# Patient Record
Sex: Female | Born: 1980
Health system: Southern US, Community
[De-identification: ages and names within clinical notes are randomized; demographics above are authoritative.]

## PROBLEM LIST (undated history)

## (undated) DIAGNOSIS — M5126 Other intervertebral disc displacement, lumbar region: Secondary | ICD-10-CM

## (undated) DIAGNOSIS — Z8711 Personal history of peptic ulcer disease: Secondary | ICD-10-CM

## (undated) DIAGNOSIS — F419 Anxiety disorder, unspecified: Secondary | ICD-10-CM

## (undated) DIAGNOSIS — G43909 Migraine, unspecified, not intractable, without status migrainosus: Secondary | ICD-10-CM

## (undated) DIAGNOSIS — J45909 Unspecified asthma, uncomplicated: Secondary | ICD-10-CM

## (undated) DIAGNOSIS — R569 Unspecified convulsions: Secondary | ICD-10-CM

## (undated) DIAGNOSIS — F32A Depression, unspecified: Secondary | ICD-10-CM

## (undated) DIAGNOSIS — F329 Major depressive disorder, single episode, unspecified: Secondary | ICD-10-CM

## (undated) DIAGNOSIS — N6002 Solitary cyst of left breast: Secondary | ICD-10-CM

## (undated) DIAGNOSIS — M797 Fibromyalgia: Secondary | ICD-10-CM

## (undated) DIAGNOSIS — Z8719 Personal history of other diseases of the digestive system: Secondary | ICD-10-CM

## (undated) HISTORY — DX: Solitary cyst of left breast: N60.02

## (undated) HISTORY — DX: Major depressive disorder, single episode, unspecified: F32.9

## (undated) HISTORY — DX: Other intervertebral disc displacement, lumbar region: M51.26

## (undated) HISTORY — DX: Unspecified convulsions: R56.9

## (undated) HISTORY — DX: Migraine, unspecified, not intractable, without status migrainosus: G43.909

## (undated) HISTORY — DX: Anxiety disorder, unspecified: F41.9

## (undated) HISTORY — DX: Fibromyalgia: M79.7

## (undated) HISTORY — DX: Unspecified asthma, uncomplicated: J45.909

## (undated) HISTORY — DX: Personal history of peptic ulcer disease: Z87.11

## (undated) HISTORY — DX: Personal history of other diseases of the digestive system: Z87.19

## (undated) HISTORY — DX: Depression, unspecified: F32.A

---
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
## 2001-01-30 HISTORY — PX: CHOLECYSTECTOMY: SHX55

## 2001-01-30 HISTORY — PX: TUBAL LIGATION: SHX77

## 2003-01-31 HISTORY — PX: VAGINAL HYSTERECTOMY: SUR661

## 2004-01-31 HISTORY — PX: LAPAROSCOPIC OOPHERECTOMY: SHX6507

## 2004-01-31 HISTORY — PX: OTHER SURGICAL HISTORY: SHX169

## 2005-01-30 HISTORY — PX: LAPAROSCOPIC OOPHERECTOMY: SHX6507

## 2015-07-21 ENCOUNTER — Ambulatory Visit (INDEPENDENT_AMBULATORY_CARE_PROVIDER_SITE_OTHER): Payer: BLUE CROSS/BLUE SHIELD | Admitting: Neurology

## 2015-07-21 ENCOUNTER — Encounter: Payer: Self-pay | Admitting: Neurology

## 2015-07-21 VITALS — BP 105/79 | HR 71 | Ht 64.0 in | Wt 210.8 lb

## 2015-07-21 DIAGNOSIS — G43009 Migraine without aura, not intractable, without status migrainosus: Secondary | ICD-10-CM | POA: Diagnosis not present

## 2015-07-21 DIAGNOSIS — Z79899 Other long term (current) drug therapy: Secondary | ICD-10-CM

## 2015-07-21 MED ORDER — DIVALPROEX SODIUM ER 500 MG PO TB24: 1000.0000 mg | ORAL_TABLET | Freq: Every day | ORAL | Status: DC

## 2015-07-21 MED ORDER — KETOROLAC TROMETHAMINE 60 MG/2ML IM SOLN
60.0000 mg | Freq: Once | INTRAMUSCULAR | Status: AC
  Administered 2015-07-21: 60 mg via INTRAMUSCULAR

## 2015-07-21 MED ORDER — RIZATRIPTAN BENZOATE 10 MG PO TABS: 10.0000 mg | ORAL_TABLET | ORAL | Status: DC | PRN

## 2015-07-21 MED ORDER — ONDANSETRON 4 MG PO TBDP: ORAL_TABLET | ORAL | Status: DC

## 2015-07-21 NOTE — Patient Instructions (Addendum)
Remember to drink plenty of fluid, eat healthy meals and do not skip any meals. Try to eat protein with a every meal and eat a healthy snack such as fruit or nuts in between meals. Try to keep a regular sleep-wake schedule and try to exercise daily, particularly in the form of walking, 20-30 minutes a day, if you can.   As far as your medications are concerned, I would like to suggest: Depakote ER 1000mg  at night At onset of headache zofran and maxalt may repeat in 2 hours Try Cambia as well for headache especially if you don;t catch it in time (may use with maxalt and zofran)  As far as diagnostic testing: Labs   Our phone number is [phone]. We also have an after hours call service for urgent matters and there is a physician on-call for urgent questions. For any emergencies you know to call 911 or go to the nearest emergency room  Valproic Acid, Divalproex Sodium delayed or extended-release tablets What is this medicine? DIVALPROEX SODIUM (dye VAL pro ex SO dee um) is used to prevent seizures caused by some forms of epilepsy. It is also used to treat bipolar mania and to prevent migraine headaches. This medicine may be used for other purposes; ask your health care provider or pharmacist if you have questions. What should I tell my health care provider before I take this medicine? They need to know if you have any of these conditions: -blood disease -brain damage or disease -kidney disease -liver disease -low blood proteins -mitochondrial disease -suicidal thoughts, plans, or attempt; a previous suicide attempt by you or a family member -urea cycle disorder (UCD) -an unusual or allergic reaction to divalproex sodium, other medicines, foods, dyes, or preservatives -pregnant or trying to get pregnant -breast-feeding How should I use this medicine? Take this medicine by mouth with a drink of water. Follow the directions on the prescription label. Do not crush or chew. If this medicine  upsets your stomach, take it with food or milk. Take your medicine at regular intervals. Do not take it more often than directed. Talk to your pediatrician regarding the use of this medicine in children. Special care may be needed. Overdosage: If you think you have taken too much of this medicine contact a poison control center or emergency room at once. NOTE: This medicine is only for you. Do not share this medicine with others. What if I miss a dose? If you miss a dose, take it as soon as you can. If it is almost time for your next dose, take only that dose. Do not take double or extra doses. What may interact with this medicine? -aspirin -barbiturates, like phenobarbital -diazepam -isoniazid -medicines for depression, anxiety, or psychotic disturbances -medicines that treat or prevent blood clots like warfarin -meropenem -other seizure medicines -rifampin -tolbutamide -zidovudine This list may not describe all possible interactions. Give your health care provider a list of all the medicines, herbs, non-prescription drugs, or dietary supplements you use. Also tell them if you smoke, drink alcohol, or use illegal drugs. Some items may interact with your medicine. What should I watch for while using this medicine? Visit your doctor or health care professional for regular checks on your progress. If you are taking this medicine to treat epilepsy (seizures), do not stop taking it suddenly. This increases the risk of seizures. Wear a medical identification bracelet or chain to say you have epilepsy or seizures, and carry a card that lists all your medicines. You  may get drowsy, dizzy, or have blurred vision. Do not drive, use machinery, or do anything that needs mental alertness until you know how this medicine affects you. To reduce dizzy or fainting spells, do not sit or stand up quickly, especially if you are an older patient. Alcohol can increase drowsiness and dizziness. Avoid alcoholic  drinks. This medicine can cause blood problems. This can mean slow healing and a risk of infection. Problems can arise if you need dental work, and in the day to day care of your teeth. Try to avoid damage to your teeth and gums when you brush or floss your teeth. This medicine can make you more sensitive to the sun. Keep out of the sun. If you cannot avoid being in the sun, wear protective clothing and use sunscreen. Do not use sun lamps or tanning beds/booths. The use of this medicine may increase the chance of suicidal thoughts or actions. Pay special attention to how you are responding while on this medicine. Any worsening of mood, or thoughts of suicide or dying should be reported to your health care professional right away. Women who become pregnant while using this medicine may enroll in the [place] Pregnancy Registry by calling [phone]. This registry collects information about the safety of antiepileptic drug use during pregnancy. Contact your doctor or healthcare professional if you notice any part of your medicine in your stool. Your healthcare provider may want to check the amount of medicine in your blood if this happens. What side effects may I notice from receiving this medicine? Side effects that you should report to your doctor or health care professional as soon as possible: -allergic reactions like skin rash, itching or hives, swelling of the face, lips, or tongue -changes in the frequency or severity of seizures -double vision or uncontrollable eye movements -nausea and vomiting -redness, blistering, peeling or loosening of the skin, including inside the mouth -stomach pain or cramps -trembling of hands or arms -unusual bleeding or bruising or pinpoint red spots on the skin -unusual swelling of the arms or legs -unusually weak or tired -worsening of mood, thoughts or actions of suicide or dying -yellowing of skin or eyes Side effects that  usually do not require medical attention (report to your doctor or health care professional if they continue or are bothersome): -change in menstrual cycle -diarrhea or constipation -headache -loss of bladder control -loss of hair or unusual growth of hair -loss or increase in appetite -weight gain or loss This list may not describe all possible side effects. Call your doctor for medical advice about side effects. You may report side effects to FDA at 1-800-FDA-1088. Where should I keep my medicine? Keep out of reach of children. Store at room temperature between 15 and 30 degrees C (59 and 86 degrees F). Keep container tightly closed. Throw away any unused medicine after the expiration date. NOTE: This sheet is a summary. It may not cover all possible information. If you have questions about this medicine, talk to your doctor, pharmacist, or health care provider.    2016, Elsevier/Gold Standard. (2011-09-12 11:50:42)  Diclofenac powder for oral solution What is this medicine? DICLOFENAC (dye KLOE fen ak) is a non-steroidal anti-inflammatory drug (NSAID). It is used to treat migraine pain. This medicine may be used for other purposes; ask your health care provider or pharmacist if you have questions. What should I tell my health care provider before I take this medicine? They need to know if you have  any of these conditions: -asthma, especially aspirin sensitive asthma -coronary artery bypass graft (CABG) surgery within the past 2 weeks -drink more than 3 alcohol-containing drinks a day -heart disease or circulation problems like heart failure or leg edema (fluid retention) -high blood pressure -kidney disease -liver disease -phenylketonuria -stomach problems -an unusual or allergic reaction to diclofenac, aspirin, other NSAIDs, other medicines, foods, dyes, or preservatives -pregnant or trying to get pregnant -breast-feeding How should I use this medicine? Mix this medicine with  1 to 2 ounces of water. Drink the medicine and water together. Follow the directions on the prescription label. Do not take your medicine more often than directed. Long-term, continuous use may increase the risk of heart attack or stroke. A special MedGuide will be given to you by the pharmacist with each prescription and refill. Be sure to read this information carefully each time. Talk to your pediatrician regarding the use of this medicine in children. Special care may be needed. Elderly patients over 61 years old may have a stronger reaction and need a smaller dose. Overdosage: If you think you have taken too much of this medicine contact a poison control center or emergency room at once. NOTE: This medicine is only for you. Do not share this medicine with others. What if I miss a dose? This does not apply. What may interact with this medicine? Do not take this medicine with any of the following medications: -cidofovir -ketorolac -methotrexate This medicine may also interact with the following medications: -alcohol -aspirin and aspirin-like medicines -cyclosporine -diuretics -lithium -medicines for blood pressure -medicines for osteoporosis -medicines that affect platelets -medicines that treat or prevent blood clots like warfarin -NSAIDs, medicines for pain and inflammation, like ibuprofen or naproxen -pemetrexed -steroid medicines like prednisone or cortisone This list may not describe all possible interactions. Give your health care provider a list of all the medicines, herbs, non-prescription drugs, or dietary supplements you use. Also tell them if you smoke, drink alcohol, or use illegal drugs. Some items may interact with your medicine. What should I watch for while using this medicine? Tell your doctor or health care professional if your pain does not get better. Talk to your doctor before taking another medicine for pain. Do not treat yourself. This medicine does not prevent  heart attack or stroke. In fact, this medicine may increase the chance of a heart attack or stroke. The chance may increase with longer use of this medicine and in people who have heart disease. If you take aspirin to prevent heart attack or stroke, talk with your doctor or health care professional. Do not take medicines such as ibuprofen and naproxen with this medicine. Side effects such as stomach upset, nausea, or ulcers may be more likely to occur. Many medicines available without a prescription should not be taken with this medicine. This medicine can cause ulcers and bleeding in the stomach and intestines at any time during treatment. Do not smoke cigarettes or drink alcohol. These increase irritation to your stomach and can make it more susceptible to damage from this medicine. Ulcers and bleeding can happen without warning symptoms and can cause death. You may get drowsy or dizzy. Do not drive, use machinery, or do anything that needs mental alertness until you know how this medicine affects you. Do not stand or sit up quickly, especially if you are an older patient. This reduces the risk of dizzy or fainting spells. This medicine can cause you to bleed more easily. Try to avoid damage  to your teeth and gums when you brush or floss your teeth. If you take migraine medicines for 10 or more days a month, your migraines may get worse. Keep a diary of headache days and medicine use. Contact your healthcare professional if your migraine attacks occur more frequently. What side effects may I notice from receiving this medicine? Side effects that you should report to your doctor or health care professional as soon as possible: -allergic reactions like skin rash, itching or hives, swelling of the face, lips, or tongue -black or bloody stools, blood in the urine or vomit -blurred vision -chest pain -difficulty breathing or wheezing -nausea or vomiting -fever -redness, blistering, peeling or loosening  of the skin, including inside the mouth -slurred speech or weakness on one side of the body -trouble passing urine or change in the amount of urine -unexplained weight gain or swelling -unusually weak or tired -yellowing of eyes or skin Side effects that usually do not require medical attention (Report these to your doctor or health care professional if they continue or are bothersome.): -constipation -diarrhea -dizziness -headache -heartburn This list may not describe all possible side effects. Call your doctor for medical advice about side effects. You may report side effects to FDA at 1-800-FDA-1088. Where should I keep my medicine? Keep out of the reach of children. Store at room temperature between 15 and 30 degrees C (59 and 86 degrees F). Throw away any unused medicine after the expiration date. NOTE: This sheet is a summary. It may not cover all possible information. If you have questions about this medicine, talk to your doctor, pharmacist, or health care provider.    2016, Elsevier/Gold Standard. (2012-09-17 10:55:34)

## 2015-07-21 NOTE — Progress Notes (Signed)
Gave Toradol 60mg/2ml IM in right deltoid. Cleaned with alcohol wipe prior to injection. Band-aid applied. Pt tolerated well.   

## 2015-07-21 NOTE — Progress Notes (Signed)
[facility]    Provider:  Dr Jaynee Eagles Referring Provider: Quentin Cornwall, MD Primary Care Physician:  Quentin Cornwall, MD  CC:  Migraines  HPI:  Marie Rios is a 35 y.o. female here as a referral from Dr. Vista Deck for Migraines and seizures in the setting of stress and anxiety.migraines Started 10 years ago. She has the migraines twice a month. She has light sensitivity, sounds sensitivity, ears ringing, dizziness, vertigo, locations in the frontal area occ in the right occipital. Feels stabbing, throbbing. It can last up to 2 days. She gets nausea no vomiting. She has diarrhea sometimes. She is on Depakote but just recently restarted, she was successfully treated with Depakote in the past but was feeling better and stopped it. She has had a hysterectomy. She has been on Depakote off and on for 20 years. Also on Dilantin. She has a history of seizures as well. The seizures always happen when she gets stressed or with anger or anxiety , she passes out and jerks and shakes and wakes up confused. Workup has been negative. EEGs have been normal. Last time she had a seizure was on June 6th. She was seeing a neurologist. She was on Topiramate 200mg  twice a day. She was seeing a doctor in [place] and the Topiramate was too high. She was angry earlier this month, she was frstrated, she "passed out". She says she got dizzy, vertigo, she came to with a cloth over her head and something cold over her neck. It lasted one minute. Husband says she will jerk than shake back and forth. Only when she stresses. This was the first one in a year. The seizures are always with stress or emotion that triggers it. She was on Depakote twice a day for many years. No other focal neurologic deficits or complaints.  Reviewed notes, labs and imaging from outside physicians, which showed: Patient was seen at [facility] for evaluation of vertigo, symptoms started in July 2012 with elevated Dilantin levels in the  emergency room. Stress exacerbate her symptoms. She has participated and vestibular rehabilitation. The rehabilitation and improved her dizziness been no change in nausea. She reported associated seizures and migraines and associated sensitivity to light. On examination tests of ocular motor function revealed normal saccades, pursuit and optokinetic eye movements. There was no spontaneous nystagmus or gaze evoked nystagmus, rotary chair testing revealed normal vestibular ocular reflex gain, phase and symmetry at all frequencies testing indicating affective vestibular control. Dix-Hallpike's tests were negative for posterior semicircular canal BPPV, roll tests were negative for horizontal semicircular canal BPPV, caloric stimulation showed symmetric vestibular responses. Diagnosis was normal peripheral and central vestibular functions.  Review of Systems: Patient complains of symptoms per HPI as well as the following symptoms: No chest pain, no shortness of breath. Pertinent negatives per HPI. All others negative.   Social History   Social History  . Marital Status: Married    Spouse Name: Aaron Edelman  . Number of Children: 3  . Years of Education: 14   Occupational History  . [employer]    Social History Main Topics  . Smoking status: Current Every Day Smoker -- 0.50 packs/day    Types: Cigarettes  . Smokeless tobacco: Not on file  . Alcohol Use: 0.0 oz/week    0 Standard drinks or equivalent per week     Comment: Socially  . Drug Use: Yes     Comment: Marijuana- ocassionally (07/21/15)  . Sexual Activity: Not on file  Other Topics Concern  . Not on file   Social History Narrative   Lives: with spouse and children    Caffeine use: 2-3 (12oz/day)    Family History  Problem Relation Age of Onset  . Diabetes    . Cancer    . Heart disease    . Stroke    . Anemia    . Fibromyalgia    . Seizures Neg Hx   . Migraines Neg Hx     Past Medical History  Diagnosis Date  . Asthma    . History of stomach ulcers   . Migraine   . Depression   . Anxiety   . Fibromyalgia   . Lumbar herniated disc   . Seizures [facility])     Past Surgical History  Procedure Laterality Date  . Appendectomy  1996  . Cholecystectomy  2003  . Tubal ligation  2003  . Vaginal hysterectomy  2005  . Pevlic surgery  [id number]    Plevic mass    Current Outpatient Prescriptions  Medication Sig Dispense Refill  . bismuth subsalicylate (PEPTO BISMOL) 262 MG/15ML suspension Take 30 mLs by mouth every 6 (six) hours as needed.    . divalproex (DEPAKOTE ER) 500 MG 24 hr tablet Take 2 tablets (1,000 mg total) by mouth at bedtime. 60 tablet 11  . Estradiol (ESTRACE PO) Take 1.25 mg by mouth daily.    Marland Kitchen ibuprofen (ADVIL,MOTRIN) 800 MG tablet Take 800 mg by mouth every 8 (eight) hours as needed.    . ondansetron (ZOFRAN ODT) 4 MG disintegrating tablet Take 1-2 tablet (4-8 mg total) by mouth every 8 (eight) hours as needed for nausea or vomiting 30 tablet 12  . rizatriptan (MAXALT) 10 MG tablet Take 1 tablet (10 mg total) by mouth as needed for migraine. May repeat in 2 hours if needed 10 tablet 12   No current facility-administered medications for this visit.    Allergies as of 07/21/2015 - Review Complete 07/21/2015  Allergen Reaction Noted  . Keflex [cephalexin]  07/21/2015  . Latex Other (See Comments) 07/21/2015  . Ciprofloxacin Rash 07/21/2015    Vitals: BP 105/79 mmHg  Pulse 71  Ht 5\' 4"  (1.626 m)  Wt 210 lb 12.8 oz (95.618 kg)  BMI 36.17 kg/m2 Last Weight:  Wt Readings from Last 1 Encounters:  07/21/15 210 lb 12.8 oz (95.618 kg)   Last Height:   Ht Readings from Last 1 Encounters:  07/21/15 5\' 4"  (1.626 m)   Physical exam: Exam: Gen: NAD, conversant, well nourised, obese, well groomed                     CV: RRR, no MRG. No Carotid Bruits. No peripheral edema, warm, nontender Eyes: Conjunctivae clear without exudates or hemorrhage  Neuro: Detailed Neurologic Exam  Speech:     Speech is normal; fluent and spontaneous with normal comprehension.  Cognition:    The patient is oriented to person, place, and time;     recent and remote memory intact;     language fluent;     normal attention, concentration,     fund of knowledge Cranial Nerves:    The pupils are equal, round, and reactive to light. The fundi are normal and spontaneous venous pulsations are present. Visual fields are full to finger confrontation. Extraocular movements are intact. Trigeminal sensation is intact and the muscles of mastication are normal. The face is symmetric. The palate elevates in the midline. Hearing intact. Voice is  normal. Shoulder shrug is normal. The tongue has normal motion without fasciculations.   Coordination:    Normal finger to nose and heel to shin. Normal rapid alternating movements.   Gait:    Heel-toe and tandem gait are normal.   Motor Observation:    No asymmetry, no atrophy, and no involuntary movements noted. Tone:    Normal muscle tone.    Posture:    Posture is normal. normal erect    Strength:    Strength is V/V in the upper and lower limbs.      Sensation: intact to LT     Reflex Exam:  DTR's:    Deep tendon reflexes in the upper and lower extremities are normal bilaterally.   Toes:    The toes are downgoing bilaterally.   Clonus:    Clonus is absent.       Assessment/Plan:  This is a 35 year old female here for migraines and seizures. I'm not entirely convinced patient has seizures, episodes happen in the setting of stress and anger and anxiety and patient appears to have psychiatric disorder. She was treated in the past successfully with Depakote. Depakote makes her tired during the day. We'll start extended release in the evenings instead and hope this works just as well and decreases the fatigue from taking Depakote during the day.  As far as your medications are concerned, I would like to suggest: Depakote ER 1000mg  at night for seizures  as well as migraines. At onset of headache zofran and maxalt may repeat in 2 hours Try Cambia as well for  (may use with maxalt and zofran) Discussed side effects of medications as detailed in the patient instructions.  As far as diagnostic testing: Labs  Patient is unable to drive, operate heavy machinery, perform activities at heights or participate in water activities until 6 months seizure free  Discussed Patients with epilepsy have a small risk of sudden unexpected death, a condition referred to as sudden unexpected death in epilepsy (SUDEP). SUDEP is defined specifically as the sudden, unexpected, witnessed or unwitnessed, nontraumatic and nondrowning death in patients with epilepsy with or without evidence for a seizure, and excluding documented status epilepticus, in which post mortem examination does not reveal a structural or toxicologic cause for death   To prevent or relieve headaches, try the following: Cool Compress. Lie down and place a cool compress on your head.  Avoid headache triggers. If certain foods or odors seem to have triggered your migraines in the past, avoid them. A headache diary might help you identify triggers.  Include physical activity in your daily routine. Try a daily walk or other moderate aerobic exercise.  Manage stress. Find healthy ways to cope with the stressors, such as delegating tasks on your to-do list.  Practice relaxation techniques. Try deep breathing, yoga, massage and visualization.  Eat regularly. Eating regularly scheduled meals and maintaining a healthy diet might help prevent headaches. Also, drink plenty of fluids.  Follow a regular sleep schedule. Sleep deprivation might contribute to headaches Consider biofeedback. With this mind-body technique, you learn to control certain bodily functions - such as muscle tension, heart rate and blood pressure - to prevent headaches or reduce headache pain.    Proceed to emergency room if you experience  new or worsening symptoms or symptoms do not resolve, if you have new neurologic symptoms or if headache is severe, or for any concerning symptom.    C: Dr. Jolly Mango, [facility]  [address] [place], [place] [address]  Phone [phone] Fax [phone]

## 2015-07-22 ENCOUNTER — Telehealth: Payer: Self-pay | Admitting: *Deleted

## 2015-07-22 ENCOUNTER — Encounter: Payer: Self-pay | Admitting: Neurology

## 2015-07-22 DIAGNOSIS — G43909 Migraine, unspecified, not intractable, without status migrainosus: Secondary | ICD-10-CM | POA: Insufficient documentation

## 2015-07-22 LAB — COMPREHENSIVE METABOLIC PANEL
ALT: 10 IU/L (ref 0–32)
AST: 15 IU/L (ref 0–40)
Albumin/Globulin Ratio: 1.3 (ref 1.2–2.2)
Albumin: 4.4 g/dL (ref 3.5–5.5)
Alkaline Phosphatase: 70 IU/L (ref 39–117)
BUN/Creatinine Ratio: 16 (ref 9–23)
BUN: 12 mg/dL (ref 6–20)
Bilirubin Total: 0.4 mg/dL (ref 0.0–1.2)
CO2: 25 mmol/L (ref 18–29)
Calcium: 9 mg/dL (ref 8.7–10.2)
Chloride: 104 mmol/L (ref 96–106)
Creatinine, Ser: 0.75 mg/dL (ref 0.57–1.00)
GFR calc Af Amer: 119 mL/min/{1.73_m2} (ref >59)
GFR calc non Af Amer: 104 mL/min/{1.73_m2} (ref >59)
Globulin, Total: 3.3 g/dL (ref 1.5–4.5)
Glucose: 81 mg/dL (ref 65–99)
Potassium: 4.2 mmol/L (ref 3.5–5.2)
Sodium: 145 mmol/L — ABNORMAL HIGH (ref 134–144)
Total Protein: 7.7 g/dL (ref 6.0–8.5)

## 2015-07-22 LAB — CBC
Hematocrit: 40.8 % (ref 34.0–46.6)
Hemoglobin: 13.2 g/dL (ref 11.1–15.9)
MCH: 27 pg (ref 26.6–33.0)
MCHC: 32.4 g/dL (ref 31.5–35.7)
MCV: 83 fL (ref 79–97)
Platelets: 190 10*3/uL (ref 150–379)
RBC: 4.89 x10E6/uL (ref 3.77–5.28)
RDW: 14.5 % (ref 12.3–15.4)
WBC: 5.6 10*3/uL (ref 3.4–10.8)

## 2015-07-22 NOTE — Telephone Encounter (Signed)
Called and LVM for pt about lab results per Dr Jaynee Eagles note. Gave [facility] phone number if she has further questions.

## 2015-07-22 NOTE — Telephone Encounter (Signed)
-----   Message from Melvenia Beam, MD sent at 07/22/2015  7:35 AM EDT ----- Labs look fine thanks

## 2015-07-26 ENCOUNTER — Encounter: Payer: Self-pay | Admitting: *Deleted

## 2015-07-26 NOTE — Progress Notes (Signed)
Received update from [facility] that they received referral for [facility] and are processing request.

## 2015-07-26 NOTE — Progress Notes (Signed)
Faxed cambia enrollment form to [place]. Fax: [phone]. Received fax confirmation.

## 2015-11-22 ENCOUNTER — Ambulatory Visit: Payer: BLUE CROSS/BLUE SHIELD | Admitting: Neurology

## 2015-12-01 ENCOUNTER — Encounter: Payer: Self-pay | Admitting: Neurology

## 2015-12-01 ENCOUNTER — Ambulatory Visit (INDEPENDENT_AMBULATORY_CARE_PROVIDER_SITE_OTHER): Payer: BLUE CROSS/BLUE SHIELD | Admitting: Neurology

## 2015-12-01 DIAGNOSIS — G43009 Migraine without aura, not intractable, without status migrainosus: Secondary | ICD-10-CM | POA: Diagnosis not present

## 2015-12-01 DIAGNOSIS — Z79899 Other long term (current) drug therapy: Secondary | ICD-10-CM

## 2015-12-01 MED ORDER — DIVALPROEX SODIUM ER 500 MG PO TB24: 1000.0000 mg | ORAL_TABLET | Freq: Every day | ORAL | 4 refills | Status: DC

## 2015-12-01 NOTE — Progress Notes (Signed)
[id number]    Provider:  Dr Jaynee Eagles Referring Provider: Quentin Cornwall, MD Primary Care Physician:  Quentin Cornwall, MD   CC:  Migraines  Interval history 12/01/2015:  Patient is doing well. One migraine a month. Tolerating Depakote well. No more events of alteration of consciousness. The maxalt works with the zofran, she has not tried the [place]. It takes several hours for the maxalt to work, she has to take it twice. Suggest taking the maxalt with the zofran and cambia. She had hysterectomy.   HPI:  Marie Rios is a 35 y.o. female here as a referral from Dr. Vista Deck for Migraines and seizures in the setting of stress and anxiety.migraines Started 10 years ago. She has the migraines twice a month. She has light sensitivity, sounds sensitivity, ears ringing, dizziness, vertigo, locations in the frontal area occ in the right occipital. Feels stabbing, throbbing. It can last up to 2 days. She gets nausea no vomiting. She has diarrhea sometimes. She is on Depakote but just recently restarted, she was successfully treated with Depakote in the past but was feeling better and stopped it. She has had a hysterectomy. She has been on Depakote off and on for 20 years. Also on Dilantin. She has a history of seizures as well. The seizures always happen when she gets stressed or with anger or anxiety , she passes out and jerks and shakes and wakes up confused. Workup has been negative. EEGs have been normal. Last time she had a seizure was on June 6th. She was seeing a neurologist. She was on Topiramate 200mg  twice a day. She was seeing a doctor in [place] and the Topiramate was too high. She was angry earlier this month, she was frstrated, she "passed out". She says she got dizzy, vertigo, she came to with a cloth over her head and something cold over her neck. It lasted one minute. Husband says she will jerk than shake back and forth. Only when she stresses. This was the first one in a  year. The seizures are always with stress or emotion that triggers it. She was on Depakote twice a day for many years. No other focal neurologic deficits or complaints.  Reviewed notes, labs and imaging from outside physicians, which showed: Patient was seen at [facility] for evaluation of vertigo, symptoms started in July 2012 with elevated Dilantin levels in the emergency room. Stress exacerbate her symptoms. She has participated and vestibular rehabilitation. The rehabilitation and improved her dizziness been no change in nausea. She reported associated seizures and migraines and associated sensitivity to light. On examination tests of ocular motor function revealed normal saccades, pursuit and optokinetic eye movements. There was no spontaneous nystagmus or gaze evoked nystagmus, rotary chair testing revealed normal vestibular ocular reflex gain, phase and symmetry at all frequencies testing indicating affective vestibular control. Dix-Hallpike's tests were negative for posterior semicircular canal BPPV, roll tests were negative for horizontal semicircular canal BPPV, caloric stimulation showed symmetric vestibular responses. Diagnosis was normal peripheral and central vestibular functions.  Review of Systems: Patient complains of symptoms per HPI as well as the following symptoms: No chest pain, no shortness of breath. Pertinent negatives per HPI. All others negative.   Social History   Social History  . Marital status: Married    Spouse name: Aaron Edelman  . Number of children: 3  . Years of education: 14   Occupational History  . [employer]    Social History Main Topics  . Smoking  status: Current Every Day Smoker    Packs/day: 0.50    Types: Cigarettes  . Smokeless tobacco: Never Used  . Alcohol use 0.0 oz/week     Comment: Socially  . Drug use:      Comment: Marijuana- ocassionally (07/21/15)  . Sexual activity: Not on file   Other Topics Concern  . Not on file   Social History  Narrative   Lives: with spouse and children    Caffeine use: 2-3 (12oz/day)    Family History  Problem Relation Age of Onset  . Diabetes    . Cancer    . Heart disease    . Stroke    . Anemia    . Fibromyalgia    . Seizures Neg Hx   . Migraines Neg Hx     Past Medical History:  Diagnosis Date  . Anxiety   . Asthma   . Depression   . Fibromyalgia   . History of stomach ulcers   . Lumbar herniated disc   . Migraine   . Seizures ([place])     Past Surgical History:  Procedure Laterality Date  . APPENDECTOMY  1996  . CHOLECYSTECTOMY  2003  . pevlic surgery  123456   Plevic mass  . TUBAL LIGATION  2003  . VAGINAL HYSTERECTOMY  2005    Current Outpatient Prescriptions  Medication Sig Dispense Refill  . bismuth subsalicylate (PEPTO BISMOL) 262 MG/15ML suspension Take 30 mLs by mouth every 6 (six) hours as needed.    . divalproex (DEPAKOTE ER) 500 MG 24 hr tablet Take 2 tablets (1,000 mg total) by mouth at bedtime. 60 tablet 11  . Estradiol (ESTRACE PO) Take 1.25 mg by mouth daily.    Marland Kitchen ibuprofen (ADVIL,MOTRIN) 800 MG tablet Take 800 mg by mouth every 8 (eight) hours as needed.    . ondansetron (ZOFRAN ODT) 4 MG disintegrating tablet Take 1-2 tablet (4-8 mg total) by mouth every 8 (eight) hours as needed for nausea or vomiting 30 tablet 12  . rizatriptan (MAXALT) 10 MG tablet Take 1 tablet (10 mg total) by mouth as needed for migraine. May repeat in 2 hours if needed 10 tablet 12   No current facility-administered medications for this visit.     Allergies as of 12/01/2015 - Review Complete 12/01/2015  Allergen Reaction Noted  . Keflex [cephalexin]  07/21/2015  . Latex Other (See Comments) 07/21/2015  . Ciprofloxacin Rash 07/21/2015    Vitals: BP 105/67 (BP Location: Right Arm, Patient Position: Sitting, Cuff Size: Large)   Pulse 77   Ht 5\' 4"  (1.626 m)   Wt 221 lb 12.8 oz (100.6 kg)   BMI 38.07 kg/m  Last Weight:  Wt Readings from Last 1 Encounters:  12/01/15 221  lb 12.8 oz (100.6 kg)   Last Height:   Ht Readings from Last 1 Encounters:  12/01/15 5\' 4"  (1.626 m)   Physical exam: Exam: Gen: NAD, conversant, well nourised, obese, well groomed                     CV: RRR, no MRG. No Carotid Bruits. No peripheral edema, warm, nontender Eyes: Conjunctivae clear without exudates or hemorrhage  Neuro: Detailed Neurologic Exam  Speech:    Speech is normal; fluent and spontaneous with normal comprehension.  Cognition:    The patient is oriented to person, place, and time;     recent and remote memory intact;     language fluent;     normal  attention, concentration,     fund of knowledge Cranial Nerves:    The pupils are equal, round, and reactive to light. The fundi are normal and spontaneous venous pulsations are present. Visual fields are full to finger confrontation. Extraocular movements are intact. Trigeminal sensation is intact and the muscles of mastication are normal. The face is symmetric. The palate elevates in the midline. Hearing intact. Voice is normal. Shoulder shrug is normal. The tongue has normal motion without fasciculations.   Coordination:    Normal finger to nose and heel to shin. Normal rapid alternating movements.   Gait:    Heel-toe and tandem gait are normal.   Motor Observation:    No asymmetry, no atrophy, and no involuntary movements noted. Tone:    Normal muscle tone.    Posture:    Posture is normal. normal erect    Strength:    Strength is V/V in the upper and lower limbs.      Sensation: intact to LT     Reflex Exam:  DTR's:    Deep tendon reflexes in the upper and lower extremities are normal bilaterally.   Toes:    The toes are downgoing bilaterally.   Clonus:    Clonus is absent.       Assessment/Plan:  This is a 35 year old female here for migraines and seizures. I'm not entirely convinced patient has seizures, episodes happen in the setting of stress and anger and anxiety and  patient appears to have psychiatric disorder. She was treated in the past successfully with Depakote. Depakote makes her tired during the day. We'll start extended release in the evenings instead and hope this works just as well and decreases the fatigue from taking Depakote during the day.  As far as your medications are concerned, I would like to suggest: Depakote ER 1000mg  at night for seizures as well as migraines. At onset of headache zofran and maxalt and cambia may repeat in 2 hours Try Cambia as well for  (may use with maxalt and zofran) Discussed side effects of medications as detailed in the patient instructions. Do not get pregnant, discussed high risk of teratogenicity, she has had hysterectomy  As far as diagnostic testing: Labs  Patient is unable to drive, operate heavy machinery, perform activities at heights or participate in water activities until 6 months seizure free  Discussed Patients with epilepsy have a small risk of sudden unexpected death, a condition referred to as sudden unexpected death in epilepsy (SUDEP). SUDEP is defined specifically as the sudden, unexpected, witnessed or unwitnessed, nontraumatic and nondrowning death in patients with epilepsy with or without evidence for a seizure, and excluding documented status epilepticus, in which post mortem examination does not reveal a structural or toxicologic cause for death   To prevent or relieve headaches, try the following:  Cool Compress. Lie down and place a cool compress on your head.   Avoid headache triggers. If certain foods or odors seem to have triggered your migraines in the past, avoid them. A headache diary might help you identify triggers.   Include physical activity in your daily routine. Try a daily walk or other moderate aerobic exercise.   Manage stress. Find healthy ways to cope with the stressors, such as delegating tasks on your to-do list.   Practice relaxation techniques. Try deep  breathing, yoga, massage and visualization.   Eat regularly. Eating regularly scheduled meals and maintaining a healthy diet might help prevent headaches. Also, drink plenty of fluids.  Follow a regular sleep schedule. Sleep deprivation might contribute to headaches  Consider biofeedback. With this mind-body technique, you learn to control certain bodily functions - such as muscle tension, heart rate and blood pressure - to prevent headaches or reduce headache pain.    Proceed to emergency room if you experience new or worsening symptoms or symptoms do not resolve, if you have new neurologic symptoms or if headache is severe, or for any concerning symptom.    C: Dr. Jolly Mango, MD  [facility] [address] [place], [place] [address]  Phone [phone] Fax [phone]  A total of 30 minutes was spent face-to-face with this patient. Over half this time was spent on counseling patient on the migraine diagnosis and different diagnostic and therapeutic options available.

## 2015-12-01 NOTE — Patient Instructions (Signed)
Remember to drink plenty of fluid, eat healthy meals and do not skip any meals. Try to eat protein with a every meal and eat a healthy snack such as fruit or nuts in between meals. Try to keep a regular sleep-wake schedule and try to exercise daily, particularly in the form of walking, 20-30 minutes a day, if you can.   As far as your medications are concerned, I would like to suggest: Continue Depakote  As far as diagnostic testing: Labs  I would like to see you back in 9 months, sooner if we need to. Please call us with any interim questions, concerns, problems, updates or refill requests.   Our phone number is [phone]. We also have an after hours call service for urgent matters and there is a physician on-call for urgent questions. For any emergencies you know to call 911 or go to the nearest emergency room

## 2015-12-02 ENCOUNTER — Telehealth: Payer: Self-pay | Admitting: *Deleted

## 2015-12-02 LAB — COMPREHENSIVE METABOLIC PANEL
ALT: 16 IU/L (ref 0–32)
AST: 16 IU/L (ref 0–40)
Albumin/Globulin Ratio: 1.2 (ref 1.2–2.2)
Albumin: 4 g/dL (ref 3.5–5.5)
Alkaline Phosphatase: 58 IU/L (ref 39–117)
BUN/Creatinine Ratio: 21 (ref 9–23)
BUN: 15 mg/dL (ref 6–20)
Bilirubin Total: 0.4 mg/dL (ref 0.0–1.2)
CO2: 26 mmol/L (ref 18–29)
Calcium: 9.1 mg/dL (ref 8.7–10.2)
Chloride: 102 mmol/L (ref 96–106)
Creatinine, Ser: 0.72 mg/dL (ref 0.57–1.00)
GFR calc Af Amer: 125 mL/min/{1.73_m2} (ref >59)
GFR calc non Af Amer: 109 mL/min/{1.73_m2} (ref >59)
Globulin, Total: 3.4 g/dL (ref 1.5–4.5)
Glucose: 66 mg/dL (ref 65–99)
Potassium: 4.3 mmol/L (ref 3.5–5.2)
Sodium: 143 mmol/L (ref 134–144)
Total Protein: 7.4 g/dL (ref 6.0–8.5)

## 2015-12-02 LAB — CBC
Hematocrit: 39.2 % (ref 34.0–46.6)
Hemoglobin: 12.9 g/dL (ref 11.1–15.9)
MCH: 28.5 pg (ref 26.6–33.0)
MCHC: 32.9 g/dL (ref 31.5–35.7)
MCV: 87 fL (ref 79–97)
Platelets: 180 10*3/uL (ref 150–379)
RBC: 4.52 x10E6/uL (ref 3.77–5.28)
RDW: 14.3 % (ref 12.3–15.4)
WBC: 6 10*3/uL (ref 3.4–10.8)

## 2015-12-02 LAB — VALPROIC ACID LEVEL: Valproic Acid Lvl: 79 ug/mL (ref 50–100)

## 2015-12-02 NOTE — Telephone Encounter (Signed)
-----   Message from Melvenia Beam, MD sent at 12/02/2015  9:40 AM EDT ----- Labs all normal thanks

## 2015-12-02 NOTE — Telephone Encounter (Signed)
Called and LVM about normal labs per Dr Jaynee Eagles note. Ok per [employer]. Gave [facility] phone number if she has further questions.

## 2016-08-30 ENCOUNTER — Ambulatory Visit: Payer: BLUE CROSS/BLUE SHIELD | Admitting: Neurology

## 2017-01-30 DIAGNOSIS — N6002 Solitary cyst of left breast: Secondary | ICD-10-CM

## 2017-01-30 HISTORY — DX: Solitary cyst of left breast: N60.02

## 2017-03-07 ENCOUNTER — Ambulatory Visit: Payer: 59 | Admitting: Family Medicine

## 2017-03-07 ENCOUNTER — Encounter: Payer: Self-pay | Admitting: Family Medicine

## 2017-03-07 VITALS — BP 118/70 | HR 105 | Temp 99.3°F | Ht 64.0 in | Wt 225.0 lb

## 2017-03-07 DIAGNOSIS — R509 Fever, unspecified: Secondary | ICD-10-CM | POA: Diagnosis not present

## 2017-03-07 DIAGNOSIS — J040 Acute laryngitis: Secondary | ICD-10-CM

## 2017-03-07 DIAGNOSIS — J0100 Acute maxillary sinusitis, unspecified: Secondary | ICD-10-CM

## 2017-03-07 LAB — POCT INFLUENZA A/B
Influenza A, POC: NEGATIVE
Influenza B, POC: NEGATIVE

## 2017-03-07 LAB — POCT RAPID STREP A (OFFICE): Rapid Strep A Screen: NEGATIVE

## 2017-03-07 MED ORDER — DOXYCYCLINE HYCLATE 100 MG PO TABS: 100.0000 mg | ORAL_TABLET | Freq: Two times a day (BID) | ORAL | 0 refills | Status: DC

## 2017-03-07 MED ORDER — METHYLPREDNISOLONE ACETATE 80 MG/ML IJ SUSP
80.0000 mg | Freq: Once | INTRAMUSCULAR | Status: AC
  Administered 2017-03-07: 80 mg via INTRAMUSCULAR

## 2017-03-07 MED FILL — DOXYCYCLINE HYCLATE 100 MG: 100 | 10 days supply | Qty: 20 | Fill #0

## 2017-03-07 NOTE — Patient Instructions (Addendum)
Most sinus infections are viral in etiology and antibiotics will not be helpful. That being said, if you start having worsening symptoms over 3 days, you are worsening by day 10 or not improving by day 14, go ahead and take it. You are on Day 2 as of now.    Continue to push fluids, practice good hand hygiene, and cover your mouth if you cough.  If you start having fevers, shaking or shortness of breath, seek immediate care.  OK to take Tylenol 1000 mg (2 extra strength tabs) or 975 mg (3 regular strength tabs) every 6 hours as needed.  Ibuprofen 400-600 mg (2-3 over the counter strength tabs) every 6 hours as needed for pain.  Salt water gargles, throat lozenges, air humidifier for your throat.   Do not get pregnant on the antibiotic.  Let us know if you need anything.

## 2017-03-07 NOTE — Progress Notes (Signed)
Chief Complaint  Patient presents with  . Nasal Congestion  . Cough  . Sore Throat    Marie Rios here for URI complaints.  Duration: 1 day  Associated symptoms: raspy voice, sinus congestion, sinus pain, ear pain and sore throat Denies: subjective fever, rhinorrhea, itchy watery eyes, ear drainage, wheezing, shortness of breath, myalgia and rigors Treatment to date: Salt gargles Sick contacts: No  ROS:  Const: Denies fevers HEENT: As noted in HPI Lungs: No SOB  Past Medical History:  Diagnosis Date  . Anxiety   . Asthma   . Depression   . Fibromyalgia   . History of stomach ulcers   . Lumbar herniated disc   . Migraine   . Seizures ([place])    Family History  Problem Relation Age of Onset  . Diabetes Unknown   . Cancer Unknown   . Heart disease Unknown   . Stroke Unknown   . Anemia Unknown   . Fibromyalgia Unknown   . Seizures Neg Hx   . Migraines Neg Hx     BP 118/70 (BP Location: Left Arm, Patient Position: Sitting, Cuff Size: Normal)   Pulse (!) 105   Temp 99.3 F (37.4 C) (Oral)   Ht 5\' 4"  (1.626 m)   Wt 225 lb (102.1 kg)   SpO2 97%   BMI 38.62 kg/m  General: Awake, alert, appears stated age HEENT: AT, [place], ears patent b/l and TM's neg, max sinuses ttp b/l, nares patent w/o discharge, +turbinate edema and bogginess b/l, worse on R, pharynx pink and without exudates, MMM Neck: No masses or asymmetry Heart: RRR Lungs: CTAB, no accessory muscle use Psych: Age appropriate judgment and insight, normal mood and affect  Laryngitis - Plan: POCT rapid strep A, methylPREDNISolone acetate (DEPO-MEDROL) injection 80 mg  Acute abscess of maxillary sinus - Plan: POCT rapid strep A, doxycycline (VIBRA-TABS) 100 MG tablet, methylPREDNISolone acetate (DEPO-MEDROL) injection 80 mg  Fever, unspecified fever cause - Plan: POCT Influenza A/B, POCT rapid strep A, methylPREDNISolone acetate (DEPO-MEDROL) injection 80 mg  Orders as above. Strep and flu neg.  Pocket rx  instructions given. Continue to push fluids, practice good hand hygiene, cover mouth when coughing. F/u prn. If starting to experience fevers, shaking, or shortness of breath, seek immediate care. Pt voiced understanding and agreement to the plan.  Sim[place], DO 03/07/17 12:12 PM

## 2017-03-07 NOTE — Progress Notes (Signed)
Pre visit review using our clinic review tool, if applicable. No additional management support is needed unless otherwise documented below in the visit note. 

## 2017-03-08 ENCOUNTER — Telehealth: Payer: Self-pay | Admitting: Family Medicine

## 2017-03-08 ENCOUNTER — Encounter: Payer: Self-pay | Admitting: Family Medicine

## 2017-03-08 MED ORDER — OSELTAMIVIR PHOSPHATE 75 MG PO CAPS: 75.0000 mg | ORAL_CAPSULE | Freq: Two times a day (BID) | ORAL | 0 refills | Status: AC

## 2017-03-08 NOTE — Telephone Encounter (Signed)
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

## 2017-03-08 NOTE — Telephone Encounter (Signed)
Copied from [place] [phone]. Topic: Quick Communication - See Telephone Encounter >> Mar 08, 2017 10:50 AM Bea Graff, NT wrote: CRM for notification. See Telephone encounter for: Pt calling and states she saw Dr. Nani Ravens yesterday and he told her to call today if she wasn't feeling any better. She states she is feeling worse today. Her temp is now 99.9. She is have body aches. She would like to see what can be done. Please give call.   03/08/17.

## 2017-03-08 NOTE — Telephone Encounter (Signed)
Given the prevalence of flu in the workplace, I will call in Tamiflu to take. TY.

## 2017-04-06 ENCOUNTER — Other Ambulatory Visit: Payer: Self-pay | Admitting: Family Medicine

## 2017-04-06 ENCOUNTER — Encounter: Payer: Self-pay | Admitting: Family Medicine

## 2017-04-06 MED ORDER — FLUCONAZOLE 150 MG PO TABS: 150.0000 mg | ORAL_TABLET | Freq: Once | ORAL | 0 refills | Status: AC

## 2017-04-06 MED FILL — FLUCONAZOLE 150 MG TABS: 150 | 1 days supply | Qty: 1 | Fill #0

## 2017-05-03 ENCOUNTER — Telehealth: Payer: Self-pay | Admitting: Neurology

## 2017-05-03 ENCOUNTER — Encounter: Payer: Self-pay | Admitting: Neurology

## 2017-05-03 MED ORDER — RIZATRIPTAN BENZOATE 10 MG PO TABS: 10.0000 mg | ORAL_TABLET | ORAL | 1 refills | Status: DC | PRN

## 2017-05-03 MED FILL — RIZATRIPTAN BENZOATE 10 MG: 10 | 4 days supply | Qty: 4 | Fill #0

## 2017-05-03 NOTE — Telephone Encounter (Signed)
Dr. Jaynee Eagles has authorized refill until pt's next appt. Maxalt refilled and sent to [facility] per pt request.   Emailed pt.

## 2017-05-03 NOTE — Telephone Encounter (Signed)
Pt called checking on status of refill for rizatriptan (MAXALT) 10 MG tablet. Please call to advise

## 2017-05-03 NOTE — Telephone Encounter (Signed)
Spoke with patient and offered her appt on 4/17 & 4/19. She was unable to come. RN scheduled her for soonest ov on Monday 07/02/17 @ 3:00 pm arrival time 2:30 pm. She verbalized appt and is aware that appt will need to be kept for further medication refills.

## 2017-05-03 NOTE — Telephone Encounter (Signed)
-----   Message from [place], Generic sent at 05/02/2017 5:47 PM EDT -----    Appointment Request From: Marie Rios    With Provider: Melvenia Beam, MD [[facility]]    Preferred Date Range: 05/04/2017 - 05/24/2017    Preferred Times: Any time    Reason for visit: Request an Appointment    Comments:  Migraines/need refill on Maxalt

## 2017-07-02 ENCOUNTER — Ambulatory Visit: Payer: Self-pay | Admitting: Neurology

## 2017-07-31 MED FILL — RIZATRIPTAN BENZOATE 10 MG: 10 | 4 days supply | Qty: 4 | Fill #1

## 2017-09-05 ENCOUNTER — Encounter: Payer: Self-pay | Admitting: Family Medicine

## 2017-09-05 MED ORDER — FLUCONAZOLE 150 MG PO TABS: 150.0000 mg | ORAL_TABLET | Freq: Once | ORAL | 0 refills | Status: AC

## 2017-09-05 MED FILL — FLUCONAZOLE 150 MG TABS: 150 | 1 days supply | Qty: 1 | Fill #0

## 2017-09-19 ENCOUNTER — Ambulatory Visit: Payer: 59 | Admitting: Neurology

## 2017-09-19 ENCOUNTER — Encounter: Payer: Self-pay | Admitting: Neurology

## 2017-09-19 ENCOUNTER — Telehealth: Payer: Self-pay | Admitting: Neurology

## 2017-09-19 VITALS — BP 108/78 | HR 76 | Ht 64.0 in | Wt 220.0 lb

## 2017-09-19 DIAGNOSIS — G43711 Chronic migraine without aura, intractable, with status migrainosus: Secondary | ICD-10-CM

## 2017-09-19 DIAGNOSIS — R0683 Snoring: Secondary | ICD-10-CM

## 2017-09-19 DIAGNOSIS — G8929 Other chronic pain: Secondary | ICD-10-CM

## 2017-09-19 DIAGNOSIS — G4730 Sleep apnea, unspecified: Secondary | ICD-10-CM

## 2017-09-19 DIAGNOSIS — R5382 Chronic fatigue, unspecified: Secondary | ICD-10-CM

## 2017-09-19 DIAGNOSIS — R519 Headache, unspecified: Secondary | ICD-10-CM

## 2017-09-19 DIAGNOSIS — G4440 Drug-induced headache, not elsewhere classified, not intractable: Secondary | ICD-10-CM

## 2017-09-19 DIAGNOSIS — R51 Headache with orthostatic component, not elsewhere classified: Secondary | ICD-10-CM

## 2017-09-19 DIAGNOSIS — H539 Unspecified visual disturbance: Secondary | ICD-10-CM

## 2017-09-19 MED ORDER — TIZANIDINE HCL 4 MG PO TABS: 4.0000 mg | ORAL_TABLET | Freq: Four times a day (QID) | ORAL | 6 refills | Status: DC | PRN

## 2017-09-19 MED ORDER — TOPIRAMATE ER 50 MG PO CAP24: ORAL_CAPSULE | ORAL | 11 refills | Status: DC

## 2017-09-19 MED FILL — tiZANidine HCL 4 MG TABS: 4 | 23 days supply | Qty: 90 | Fill #0

## 2017-09-19 NOTE — Progress Notes (Signed)
[id number]    Provider:  Dr Jaynee Eagles Referring Provider: Quentin Cornwall, MD Primary Care Physician:  Quentin Cornwall, MD   CC:  Migraines  Interval history 09/19/2017: Has not seen patient in approx 2 years. Migraines worsened last year in the setting of stress. She has not taken Depakote in 4 months, she was tired of it. She had reported seizure in June of this year, discussed no driving restrictions. She stopped taking her Depakote. Over the last year she has 15 headache days a month and 8 are migrainous. Migraines are She has light sensitivity, sounds sensitivity, ears ringing, dizziness, vertigo, locations in the frontal area occ in the right occipital. Feels stabbing, throbbing. It can last up to 2 days. Unilateral migraines, pulsating and pounding and throbbing, She gets nausea no vomiting, She has light sensitivity, sounds sensitivity, ears ringing, dizziness, vertigo, locations in the frontal area occ in the right occipital. Feels stabbing, throbbing. It can last up to 2 days and be severe. She gets nausea no vomiting. She has untreated sleep apnea. She takes excedrin every day. She has positional headaches and also vision blurriness and vision changes. Untreated sleep apnea with severe sleep apnea.  - requested records from previous sleep doctor, pending  - reviewed sleep report and data, AHI in 2011 to a max of 121 in REM sleep, was titrated on 10cm in 2011  Interval history 12/01/2015:  Patient is doing well. One migraine a month. Tolerating Depakote well. No more events of alteration of consciousness. The maxalt works with the zofran, she has not tried the [place]. It takes several hours for the maxalt to work, she has to take it twice. Suggest taking the maxalt with the zofran and cambia. She had hysterectomy.   HPI:  Marie Rios is a 37 y.o. female here as a referral from Dr. Vista Deck for Migraines and seizures in the setting of stress and anxiety.migraines Started  10 years ago. She has the migraines twice a month. She has light sensitivity, sounds sensitivity, ears ringing, dizziness, vertigo, locations in the frontal area occ in the right occipital. Feels stabbing, throbbing. It can last up to 2 days. She gets nausea no vomiting. She has diarrhea sometimes. She is on Depakote but just recently restarted, she was successfully treated with Depakote in the past but was feeling better and stopped it. She has had a hysterectomy. She has been on Depakote off and on for 20 years. Also on Dilantin. She has a history of seizures as well. The seizures always happen when she gets stressed or with anger or anxiety , she passes out and jerks and shakes and wakes up confused. Workup has been negative. EEGs have been normal. Last time she had a seizure was on June 6th. She was seeing a neurologist. She was on Topiramate 200mg  twice a day. She was seeing a doctor in [place] and the Topiramate was too high. She was angry earlier this month, she was frstrated, she "passed out". She says she got dizzy, vertigo, she came to with a cloth over her head and something cold over her neck. It lasted one minute. Husband says she will jerk than shake back and forth. Only when she stresses. This was the first one in a year. The seizures are always with stress or emotion that triggers it. She was on Depakote twice a day for many years. No other focal neurologic deficits or complaints.  Reviewed notes, labs and imaging from outside physicians, which showed:  Patient was seen at [facility] for evaluation of vertigo, symptoms started in July 2012 with elevated Dilantin levels in the emergency room. Stress exacerbate her symptoms. She has participated and vestibular rehabilitation. The rehabilitation and improved her dizziness been no change in nausea. She reported associated seizures and migraines and associated sensitivity to light. On examination tests of ocular motor function revealed normal saccades,  pursuit and optokinetic eye movements. There was no spontaneous nystagmus or gaze evoked nystagmus, rotary chair testing revealed normal vestibular ocular reflex gain, phase and symmetry at all frequencies testing indicating affective vestibular control. Dix-Hallpike's tests were negative for posterior semicircular canal BPPV, roll tests were negative for horizontal semicircular canal BPPV, caloric stimulation showed symmetric vestibular responses. Diagnosis was normal peripheral and central vestibular functions.  Review of Systems: Patient complains of symptoms per HPI as well as the following symptoms: No chest pain, no shortness of breath. Pertinent negatives per HPI. All others negative.   Social History   Socioeconomic History  . Marital status: Married    Spouse name: Aaron Edelman  . Number of children: 3  . Years of education: 19  . Highest education level: Not on file  Occupational History  . Occupation: [employer]    Comment: left dec 2018  . 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 Financial resource strain: Not on file  . Food insecurity:    Worry: Not on file    Inability: Not on file  . Transportation needs:    Medical: Not on file    Non-medical: Not on file  Tobacco Use  . Smoking status: Former Smoker    Packs/day: 0.50    Last attempt to quit: 2018    Years since quitting: 1.6  . Smokeless tobacco: Never Used  . Tobacco comment: black & mild  Substance and Sexual Activity  . Alcohol use: Not Currently    Comment: was social, no longer drinking   . Drug use: Not Currently    Comment: Marijuana- ocassionally (07/21/15)  . Sexual activity: Not on file  Lifestyle  . Physical activity:    Days per week: Not on file    Minutes per session: Not on file  . Stress: Not on file  Relationships  . Social connections:    Talks on phone: Not on file    Gets together: Not on file    Attends religious  service: Not on file    Active member of club or organization: Not on file    Attends meetings of clubs or organizations: Not on file    Relationship status: Not on file  . Intimate partner violence:    Fear of current or ex partner: Not on file    Emotionally abused: Not on file    Physically abused: Not on file    Forced sexual activity: Not on file  Other Topics Concern  . Not on file  Social History Narrative   Lives: with spouse and 2 of her children    Caffeine use: 1 cup of tea daily   Right handed    Family History  Problem Relation Age of Onset  . Heart attack Father        nov 2018  . Diabetes Unknown   . Cancer Unknown   . Heart disease Unknown   . Stroke Unknown   . Anemia Unknown   . Fibromyalgia Unknown   . Seizures Neg  Hx   . Migraines Neg Hx     Past Medical History:  Diagnosis Date  . Anxiety   . Asthma   . Breast cyst, left 2019  . Depression   . Fibromyalgia   . History of stomach ulcers   . Lumbar herniated disc   . Migraine   . Seizures ([place])     Past Surgical History:  Procedure Laterality Date  . APPENDECTOMY  1996  . CHOLECYSTECTOMY  2003  . LAPAROSCOPIC OOPHERECTOMY Right 2006  . LAPAROSCOPIC OOPHERECTOMY Left 2007  . pevlic surgery  6213   Plevic mass  . TUBAL LIGATION  2003  . VAGINAL HYSTERECTOMY  2005    Current Outpatient Medications  Medication Sig Dispense Refill  . Bismuth Subsalicylate (PEPTO-BISMOL PO) Take 1 tablet by mouth as needed.    . rizatriptan (MAXALT) 10 MG tablet Take 1 tablet (10 mg total) by mouth as needed for migraine. May repeat in 2 hours if needed. Max 2 doses in 24 hours. 10 tablet 1  . Estradiol (ESTRACE PO) Take 1.25 mg by mouth daily.    . ondansetron (ZOFRAN ODT) 4 MG disintegrating tablet Take 1-2 tablet (4-8 mg total) by mouth every 8 (eight) hours as needed for nausea or vomiting (Patient not taking: Reported on 09/19/2017) 30 tablet 12  . tiZANidine (ZANAFLEX) 4 MG tablet Take 1 tablet (4 mg  total) by mouth every 6 (six) hours as needed for muscle spasms. 90 tablet 6  . Topiramate ER (TROKENDI XR) 50 MG CP24 Take 50mg  to 100mg  at bedtime 60 capsule 11   No current facility-administered medications for this visit.     Allergies as of 09/19/2017 - Review Complete 09/19/2017  Allergen Reaction Noted  . Keflex [cephalexin]  07/21/2015  . Latex Other (See Comments) 07/21/2015  . Ciprofloxacin Rash 07/21/2015    Vitals: BP 108/78 (BP Location: Right Arm, Patient Position: Sitting)   Pulse 76   Ht 5\' 4"  (1.626 m)   Wt 220 lb (99.8 kg)   BMI 37.76 kg/m  Last Weight:  Wt Readings from Last 1 Encounters:  09/19/17 220 lb (99.8 kg)   Last Height:   Ht Readings from Last 1 Encounters:  09/19/17 5\' 4"  (1.626 m)   Physical exam: Exam: Gen: NAD, conversant, well nourised, obese, well groomed                     CV: RRR, no MRG. No Carotid Bruits. No peripheral edema, warm, nontender Eyes: Conjunctivae clear without exudates or hemorrhage  Neuro: Detailed Neurologic Exam  Speech:    Speech is normal; fluent and spontaneous with normal comprehension.  Cognition:    The patient is oriented to person, place, and time;     recent and remote memory intact;     language fluent;     normal attention, concentration,     fund of knowledge Cranial Nerves:    The pupils are equal, round, and reactive to light. The fundi are normal and spontaneous venous pulsations are present. Visual fields are full to finger confrontation. Extraocular movements are intact. Trigeminal sensation is intact and the muscles of mastication are normal. The face is symmetric. The palate elevates in the midline. Hearing intact. Voice is normal. Shoulder shrug is normal. The tongue has normal motion without fasciculations.   Coordination:    Normal finger to nose and heel to shin. Normal rapid alternating movements.   Gait:    Heel-toe and tandem gait are normal.  Motor Observation:    No  asymmetry, no atrophy, and no involuntary movements noted. Tone:    Normal muscle tone.    Posture:    Posture is normal. normal erect    Strength:    Strength is V/V in the upper and lower limbs.      Sensation: intact to LT     Reflex Exam:  DTR's:    Deep tendon reflexes in the upper and lower extremities are normal bilaterally.   Toes:    The toes are downgoing bilaterally.   Clonus:    Clonus is absent.       Assessment/Plan:  This is a 37 year old female here for migraines and seizures. I'm not entirely convinced patient has seizures, episodes happen in the setting of stress and anger and anxiety and patient appears to have psychiatric disorder. She was treated in the past successfully with Depakote but stopped it 4 months ago on her own.  - Medication overuse: takes daily excedrin, discussed rebound headache, discussed needs to stop can cause daily refractory headaches due to overuse - Untreated sleep apnea and morning headaches: Had sleep test in [place] 4-5 years ago, Dr. Casandra Doffing requesting records. Needs new slp evaluation, sleep study will order. ESS 16. - Patient is unable to drive, operate heavy machinery, perform activities at heights or participate in water activities until 6 months seizure free - MRI brain due to concerning symptoms of morning headaches, positional headaches,vision changes  to look for space occupying mass, chiari or intracranial hypertension (pseudotumor). - Trokendi 50mg  then will increase for migraines and seizures.  - Look up [place] and also consider Botox for migraines - Keep me udated through email on [employer] ordered  Do not get pregnant, discussed high risk of teratogenicity, she has had hysterectomy  Discussed Patients with epilepsy have a small risk of sudden unexpected death, a condition referred to as sudden unexpected death in epilepsy (SUDEP). SUDEP is defined specifically as the sudden, unexpected, witnessed or  unwitnessed, nontraumatic and nondrowning death in patients with epilepsy with or without evidence for a seizure, and excluding documented status epilepticus, in which post mortem examination does not reveal a structural or toxicologic cause for death   Orders Placed This Encounter  Procedures  . MR BRAIN W WO CONTRAST  . Comprehensive metabolic panel  . CBC  . Ambulatory referral to Sleep Studies   Meds ordered this encounter  Medications  . tiZANidine (ZANAFLEX) 4 MG tablet    Sig: Take 1 tablet (4 mg total) by mouth every 6 (six) hours as needed for muscle spasms.    Dispense:  90 tablet    Refill:  6  . Topiramate ER (TROKENDI XR) 50 MG CP24    Sig: Take 50mg  to 100mg  at bedtime    Dispense:  60 capsule    Refill:  11    Patient has copay card     To prevent or relieve headaches, try the following:  Cool Compress. Lie down and place a cool compress on your head.   Avoid headache triggers. If certain foods or odors seem to have triggered your migraines in the past, avoid them. A headache diary might help you identify triggers.   Include physical activity in your daily routine. Try a daily walk or other moderate aerobic exercise.   Manage stress. Find healthy ways to cope with the stressors, such as delegating tasks on your to-do list.   Practice relaxation techniques. Try deep breathing, yoga, massage and  visualization.   Eat regularly. Eating regularly scheduled meals and maintaining a healthy diet might help prevent headaches. Also, drink plenty of fluids.   Follow a regular sleep schedule. Sleep deprivation might contribute to headaches  Consider biofeedback. With this mind-body technique, you learn to control certain bodily functions - such as muscle tension, heart rate and blood pressure - to prevent headaches or reduce headache pain.    Proceed to emergency room if you experience new or worsening symptoms or symptoms do not resolve, if you have new neurologic  symptoms or if headache is severe, or for any concerning symptom.    C: Dr. Jolly Mango, MD  [facility] [address] [place], [place] [address]  Phone [phone] Fax [phone]  A total of 45 minutes was spent face-to-face with this patient. Over half this time was spent on counseling patient on the migraine diagnosis and different diagnostic and therapeutic options available.

## 2017-09-19 NOTE — Telephone Encounter (Signed)
lvm for pt to call back about scheduling [facility] Auth: [id number] (exp. 09/19/17 to 11/03/17)

## 2017-09-19 NOTE — Telephone Encounter (Signed)
Patient returned my call. I informed her it would cost her about $792.91 because her deducible hasn't been met. I did offer payment plan.. She state she will think about it and get back to me.

## 2017-09-19 NOTE — Patient Instructions (Addendum)
Stop daily acute medications. Can take Tizanidine for headaches as needed and maxalt. MRI brain w/wo contrast Sleep evaluation Start Trokendi every night and Tizanidine as needed for headache Trokendi 25mg  for one week then 50mg  for one week then stay at 50mg  or may increase to 100mg  at bedtime Look up Aimovig and also consider Botox for migraines - Keep me udated through email on mychart and we can make changes  Tizanidine tablets or capsules What is this medicine? TIZANIDINE (tye ZAN i deen) helps to relieve muscle spasms. It may be used to help in the treatment of multiple sclerosis and spinal cord injury. This medicine may be used for other purposes; ask your health care provider or pharmacist if you have questions. COMMON BRAND NAME(S): Zanaflex What should I tell my health care provider before I take this medicine? They need to know if you have any of these conditions: -kidney disease -liver disease -low blood pressure -mental disorder -an unusual or allergic reaction to tizanidine, other medicines, lactose (tablets only), foods, dyes, or preservatives -pregnant or trying to get pregnant -breast-feeding How should I use this medicine? Take this medicine by mouth with a full glass of water. Take this medicine on an empty stomach, at least 30 minutes before or 2 hours after food. Do not take with food unless you talk with your doctor. Follow the directions on the prescription label. Take your medicine at regular intervals. Do not take your medicine more often than directed. Do not stop taking except on your doctor's advice. Suddenly stopping the medicine can be very dangerous. Talk to your pediatrician regarding the use of this medicine in children. Patients over 74 years old may have a stronger reaction and need a smaller dose. Overdosage: If you think you have taken too much of this medicine contact a poison control center or emergency room at once. NOTE: This medicine is only for  you. Do not share this medicine with others. What if I miss a dose? If you miss a dose, take it as soon as you can. If it is almost time for your next dose, take only that dose. Do not take double or extra doses. What may interact with this medicine? Do not take this medicine with any of the following medications: -ciprofloxacin -cisapride -dofetilide -dronedarone -fluvoxamine -narcotic medicines for cough -pimozide -thiabendazole -thioridazine -ziprasidone This medicine may also interact with the following medications: -acyclovir -alcohol -antihistamines for allergy, cough and cold -baclofen -certain antibiotics like levofloxacin, ofloxacin -certain medicines for anxiety or sleep -certain medicines for blood pressure, heart disease, irregular heart beat -certain medicines for depression like amitriptyline, fluoxetine, sertraline -certain medicines for seizures like phenobarbital, primidone -certain medicines for stomach problems like cimetidine, famotidine -female hormones, like estrogens or progestins and birth control pills, patches, rings, or injections -general anesthetics like halothane, isoflurane, methoxyflurane, propofol -local anesthetics like lidocaine, pramoxine, tetracaine -medicines that relax muscles for surgery -narcotic medicines for pain -other medicines that prolong the QT interval (cause an abnormal heart rhythm) -phenothiazines like chlorpromazine, mesoridazine, prochlorperazine -ticlopidine -zileuton This list may not describe all possible interactions. Give your health care provider a list of all the medicines, herbs, non-prescription drugs, or dietary supplements you use. Also tell them if you smoke, drink alcohol, or use illegal drugs. Some items may interact with your medicine. What should I watch for while using this medicine? Tell your doctor or health care professional if your symptoms do not start to get better or if they get worse. You may get  drowsy or dizzy. Do not drive, use machinery, or do anything that needs mental alertness until you know how this medicine affects you. Do not stand or sit up quickly, especially if you are an older patient. This reduces the risk of dizzy or fainting spells. Alcohol may interfere with the effect of this medicine. Avoid alcoholic drinks. If you are taking another medicine that also causes drowsiness, you may have more side effects. Give your health care provider a list of all medicines you use. Your doctor will tell you how much medicine to take. Do not take more medicine than directed. Call emergency for help if you have problems breathing or unusual sleepiness. Your mouth may get dry. Chewing sugarless gum or sucking hard candy, and drinking plenty of water may help. Contact your doctor if the problem does not go away or is severe. What side effects may I notice from receiving this medicine? Side effects that you should report to your doctor or health care professional as soon as possible: -allergic reactions like skin rash, itching or hives, swelling of the face, lips, or tongue -breathing problems -hallucinations -signs and symptoms of liver injury like dark yellow or brown urine; general ill feeling or flu-like symptoms; light-colored stools; loss of appetite; nausea; right upper quadrant belly pain; unusually weak or tired; yellowing of the eyes or skin -signs and symptoms of low blood pressure like dizziness; feeling faint or lightheaded, falls; unusually weak or tired -unusually slow heartbeat -unusually weak or tired Side effects that usually do not require medical attention (report to your doctor or health care professional if they continue or are bothersome): -blurred vision -constipation -dizziness -dry mouth -tiredness This list may not describe all possible side effects. Call your doctor for medical advice about side effects. You may report side effects to FDA at 1-800-FDA-1088. Where  should I keep my medicine? Keep out of the reach of children. Store at room temperature between 15 and 30 degrees C (59 and 86 degrees F). Throw away any unused medicine after the expiration date. NOTE: This sheet is a summary. It may not cover all possible information. If you have questions about this medicine, talk to your doctor, pharmacist, or health care provider.  2018 Elsevier/Gold Standard (2014-10-27 13:52:12)   Topiramate extended-release capsules What is this medicine? TOPIRAMATE (toe PYRE a mate) is used to treat seizures in adults or children with epilepsy. It is also used for the prevention of migraine headaches. This medicine may be used for other purposes; ask your health care provider or pharmacist if you have questions. COMMON BRAND NAME(S): Trokendi XR What should I tell my health care provider before I take this medicine? They need to know if you have any of these conditions: -cirrhosis of the liver or liver disease -diarrhea -glaucoma -kidney stones or kidney disease -lung disease like asthma, obstructive pulmonary disease, emphysema -metabolic acidosis -on a ketogenic diet -scheduled for surgery or a procedure -suicidal thoughts, plans, or attempt; a previous suicide attempt by you or a family member -an unusual or allergic reaction to topiramate, other medicines, foods, dyes, or preservatives -pregnant or trying to get pregnant -breast-feeding How should I use this medicine? Take this medicine by mouth with a glass of water. Follow the directions on the prescription label. Trokendi XR capsules must be swallowed whole. Do not sprinkle on food, break, crush, dissolve, or chew. Qudexy XR capsules may be swallowed whole or opened and sprinkled on a small amount of soft food. This mixture must be  swallowed immediately. Do not chew or store mixture for later use. You may take this medicine with meals. Take your medicine at regular intervals. Do not take it more often than  directed. Talk to your pediatrician regarding the use of this medicine in children. Special care may be needed. While Trokendi XR may be prescribed for children as young as 6 years and Qudexy XR may be prescribed for children as young as 2 years for selected conditions, precautions do apply. Overdosage: If you think you have taken too much of this medicine contact a poison control center or emergency room at once. NOTE: This medicine is only for you. Do not share this medicine with others. What if I miss a dose? If you miss a dose, take it as soon as you can. If it is almost time for your next dose, take only that dose. Do not take double or extra doses. What may interact with this medicine? Do not take this medicine with any of the following medications: -probenecid This medicine may also interact with the following medications: -acetazolamide -alcohol -amitriptyline -birth control pills -digoxin -hydrochlorothiazide -lithium -medicines for pain, sleep, or muscle relaxation -metformin -methazolamide -other seizure or epilepsy medicines -pioglitazone -risperidone This list may not describe all possible interactions. Give your health care provider a list of all the medicines, herbs, non-prescription drugs, or dietary supplements you use. Also tell them if you smoke, drink alcohol, or use illegal drugs. Some items may interact with your medicine. What should I watch for while using this medicine? Visit your doctor or health care professional for regular checks on your progress. Do not stop taking this medicine suddenly. This increases the risk of seizures if you are using this medicine to control epilepsy. Wear a medical identification bracelet or chain to say you have epilepsy or seizures, and carry a card that lists all your medicines. This medicine can decrease sweating and increase your body temperature. Watch for signs of deceased sweating or fever, especially in children. Avoid extreme  heat, hot baths, and saunas. Be careful about exercising, especially in hot weather. Contact your health care provider right away if you notice a fever or decrease in sweating. You should drink plenty of fluids while taking this medicine. If you have had kidney stones in the past, this will help to reduce your chances of forming kidney stones. If you have stomach pain, with nausea or vomiting and yellowing of your eyes or skin, call your doctor immediately. You may get drowsy, dizzy, or have blurred vision. Do not drive, use machinery, or do anything that needs mental alertness until you know how this medicine affects you. To reduce dizziness, do not sit or stand up quickly, especially if you are an older patient. Alcohol can increase drowsiness and dizziness. Avoid alcoholic drinks. Do not drink alcohol for 6 hours before or 6 hours after taking Trokendi XR. If you notice blurred vision, eye pain, or other eye problems, seek medical attention at once for an eye exam. The use of this medicine may increase the chance of suicidal thoughts or actions. Pay special attention to how you are responding while on this medicine. Any worsening of mood, or thoughts of suicide or dying should be reported to your health care professional right away. This medicine may increase the chance of developing metabolic acidosis. If left untreated, this can cause kidney stones, bone disease, or slowed growth in children. Symptoms include breathing fast, fatigue, loss of appetite, irregular heartbeat, or loss of consciousness. Call  your doctor immediately if you experience any of these side effects. Also, tell your doctor about any surgery you plan on having while taking this medicine since this may increase your risk for metabolic acidosis. Birth control pills may not work properly while you are taking this medicine. Talk to your doctor about using an extra method of birth control. Women who become pregnant while using this  medicine may enroll in the [place] Pregnancy Registry by calling [phone]. This registry collects information about the safety of antiepileptic drug use during pregnancy. What side effects may I notice from receiving this medicine? Side effects that you should report to your doctor or health care professional as soon as possible: -allergic reactions like skin rash, itching or hives, swelling of the face, lips, or tongue -decreased sweating and/or rise in body temperature -depression -difficulty breathing, fast or irregular breathing patterns -difficulty speaking -difficulty walking or controlling muscle movements -hearing impairment -redness, blistering, peeling or loosening of the skin, including inside the mouth -tingling, pain or numbness in the hands or feet -unusually weak or tired -worsening of mood, thoughts or actions of suicide or dying Side effects that usually do not require medical attention (report to your doctor or health care professional if they continue or are bothersome): -altered taste -back pain, joint or muscle aches and pains -diarrhea, or constipation -headache -loss of appetite -nausea -stomach upset, indigestion -tremors This list may not describe all possible side effects. Call your doctor for medical advice about side effects. You may report side effects to FDA at 1-800-FDA-1088. Where should I keep my medicine? Keep out of the reach of children. Store at room temperature between 15 and 30 degrees C (59 and 86 degrees F) in a tightly closed container. Protect from moisture. Throw away any unused medicine after the expiration date. NOTE: This sheet is a summary. It may not cover all possible information. If you have questions about this medicine, talk to your doctor, pharmacist, or health care provider.  2018 Elsevier/Gold Standard (2015-05-07 12:33:11)   Sleep Apnea Sleep apnea is a condition in which breathing pauses or  becomes shallow during sleep. Episodes of sleep apnea usually last 10 seconds or longer, and they may occur as many as 20 times an hour. Sleep apnea disrupts your sleep and keeps your body from getting the rest that it needs. This condition can increase your risk of certain health problems, including:  Heart attack.  Stroke.  Obesity.  Diabetes.  Heart failure.  Irregular heartbeat.  There are three kinds of sleep apnea:  Obstructive sleep apnea. This kind is caused by a blocked or collapsed airway.  Central sleep apnea. This kind happens when the part of the brain that controls breathing does not send the correct signals to the muscles that control breathing.  Mixed sleep apnea. This is a combination of obstructive and central sleep apnea.  What are the causes? The most common cause of this condition is a collapsed or blocked airway. An airway can collapse or become blocked if:  Your throat muscles are abnormally relaxed.  Your tongue and tonsils are larger than normal.  You are overweight.  Your airway is smaller than normal.  What increases the risk? This condition is more likely to develop in people who:  Are overweight.  Smoke.  Have a smaller than normal airway.  Are elderly.  Are female.  Drink alcohol.  Take sedatives or tranquilizers.  Have a family history of sleep apnea.  What are  the signs or symptoms? Symptoms of this condition include:  Trouble staying asleep.  Daytime sleepiness and tiredness.  Irritability.  Loud snoring.  Morning headaches.  Trouble concentrating.  Forgetfulness.  Decreased interest in sex.  Unexplained sleepiness.  Mood swings.  Personality changes.  Feelings of depression.  Waking up often during the night to urinate.  Dry mouth.  Sore throat.  How is this diagnosed? This condition may be diagnosed with:  A medical history.  A physical exam.  A series of tests that are done while you are  sleeping (sleep study). These tests are usually done in a sleep lab, but they may also be done at home.  How is this treated? Treatment for this condition aims to restore normal breathing and to ease symptoms during sleep. It may involve managing health issues that can affect breathing, such as high blood pressure or obesity. Treatment may include:  Sleeping on your side.  Using a decongestant if you have nasal congestion.  Avoiding the use of depressants, including alcohol, sedatives, and narcotics.  Losing weight if you are overweight.  Making changes to your diet.  Quitting smoking.  Using a device to open your airway while you sleep, such as: ? An oral appliance. This is a custom-made mouthpiece that shifts your lower jaw forward. ? A continuous positive airway pressure (CPAP) device. This device delivers oxygen to your airway through a mask. ? A nasal expiratory positive airway pressure (EPAP) device. This device has valves that you put into each nostril. ? A bi-level positive airway pressure (BPAP) device. This device delivers oxygen to your airway through a mask.  Surgery if other treatments do not work. During surgery, excess tissue is removed to create a wider airway.  It is important to get treatment for sleep apnea. Without treatment, this condition can lead to:  High blood pressure.  Coronary artery disease.  (Men) An inability to achieve or maintain an erection (impotence).  Reduced thinking abilities.  Follow these instructions at home:  Make any lifestyle changes that your health care provider recommends.  Eat a healthy, well-balanced diet.  Take over-the-counter and prescription medicines only as told by your health care provider.  Avoid using depressants, including alcohol, sedatives, and narcotics.  Take steps to lose weight if you are overweight.  If you were given a device to open your airway while you sleep, use it only as told by your health care  provider.  Do not use any tobacco products, such as cigarettes, chewing tobacco, and e-cigarettes. If you need help quitting, ask your health care provider.  Keep all follow-up visits as told by your health care provider. This is important. Contact a health care provider if:  The device that you received to open your airway during sleep is uncomfortable or does not seem to be working.  Your symptoms do not improve.  Your symptoms get worse. Get help right away if:  You develop chest pain.  You develop shortness of breath.  You develop discomfort in your back, arms, or stomach.  You have trouble speaking.  You have weakness on one side of your body.  You have drooping in your face. These symptoms may represent a serious problem that is an emergency. Do not wait to see if the symptoms will go away. Get medical help right away. Call your local emergency services (911 in the [place] Do not drive yourself to the hospital. This information is not intended to replace advice given to you by your  health care provider. Make sure you discuss any questions you have with your health care provider. Document Released: 01/06/2002 Document Revised: 09/12/2015 Document Reviewed: 10/26/2014 Elsevier Interactive Patient Education  [employer].

## 2017-09-20 LAB — CBC
Hematocrit: 39.1 % (ref 34.0–46.6)
Hemoglobin: 12.7 g/dL (ref 11.1–15.9)
MCH: 26.6 pg (ref 26.6–33.0)
MCHC: 32.5 g/dL (ref 31.5–35.7)
MCV: 82 fL (ref 79–97)
Platelets: 217 10*3/uL (ref 150–450)
RBC: 4.77 x10E6/uL (ref 3.77–5.28)
RDW: 14.9 % (ref 12.3–15.4)
WBC: 4.8 10*3/uL (ref 3.4–10.8)

## 2017-09-20 LAB — COMPREHENSIVE METABOLIC PANEL
ALT: 15 IU/L (ref 0–32)
AST: 17 IU/L (ref 0–40)
Albumin/Globulin Ratio: 1.5 (ref 1.2–2.2)
Albumin: 4.3 g/dL (ref 3.5–5.5)
Alkaline Phosphatase: 81 IU/L (ref 39–117)
BUN/Creatinine Ratio: 15 (ref 9–23)
BUN: 10 mg/dL (ref 6–20)
Bilirubin Total: 0.4 mg/dL (ref 0.0–1.2)
CO2: 24 mmol/L (ref 20–29)
Calcium: 9.2 mg/dL (ref 8.7–10.2)
Chloride: 105 mmol/L (ref 96–106)
Creatinine, Ser: 0.66 mg/dL (ref 0.57–1.00)
GFR calc Af Amer: 131 mL/min/{1.73_m2} (ref >59)
GFR calc non Af Amer: 113 mL/min/{1.73_m2} (ref >59)
Globulin, Total: 2.9 g/dL (ref 1.5–4.5)
Glucose: 80 mg/dL (ref 65–99)
Potassium: 4.4 mmol/L (ref 3.5–5.2)
Sodium: 143 mmol/L (ref 134–144)
Total Protein: 7.2 g/dL (ref 6.0–8.5)

## 2017-09-24 MED FILL — RIZATRIPTAN BENZOATE 10 MG: 10 | 4 days supply | Qty: 4 | Fill #2

## 2017-09-25 ENCOUNTER — Telehealth: Payer: Self-pay | Admitting: *Deleted

## 2017-09-25 NOTE — Telephone Encounter (Signed)
Completed a PA for Trokendi XR 50 mg capsule on Cover My Meds. KEY: [id number]. Meds tried and failed: Topiramate 200 mg BID, Depakote. Anticipate a determination within 72 hours. Patient has copay card to use despite any potential insurance denial.

## 2017-09-25 NOTE — Telephone Encounter (Signed)
Called [facility] and reviewed Trokendi copay card instructions. Pt is able to get 30 capsules of 50 mg for $0 but 60 capsules will be about $100. Called pt and LVM asking for call back. Left office number and hours in message.

## 2017-09-27 ENCOUNTER — Other Ambulatory Visit: Payer: Self-pay | Admitting: *Deleted

## 2017-09-27 MED ORDER — METHYLPREDNISOLONE 4 MG PO TBPK: ORAL_TABLET | ORAL | 0 refills | Status: DC

## 2017-09-27 MED FILL — METHYLPREDNISOLONE 4 MG TAB: 4 | 6 days supply | Qty: 21 | Fill #0

## 2017-09-27 NOTE — Telephone Encounter (Signed)
Called pt and offered Depakote infusion. Pt stated that she is unable to leave work. She is able to function but would like something different so she doesn't have to leave work. RN advised she would ask Dr. Brett Fairy for further advice. Pt appreciative.

## 2017-09-27 NOTE — Telephone Encounter (Signed)
Called pt and discussed that Dr. Brett Fairy has offered a steroid dose pack. Pt appreciative and aware that it will be sent to [facility]. She is aware to follow pack instructions and take with food. Will be 21 pills over a course of several days, maybe 2 days or so before she notices improvement. She stated that she had forgotten to mention that her eyes were swollen today like "sleepy puffy tired" but denied any problems with vision, ptosis, or any other neurological changes. Pt advised to proceed to ED immediately in the event of worse headache of her life or any neurological changes for ex. Numbness/tingling, weakness on one side of the body, eye or face droop, slurred speech, vision changes, etc. Pt verbalized understanding and appreciation. She will call back if needed.

## 2017-09-27 NOTE — Telephone Encounter (Signed)
We can offer infusion of depakote iv to break the headache cycle. 500 mg iv pulse , repeat once  if only partial response.  Does [facility] have a slot?

## 2017-09-27 NOTE — Telephone Encounter (Signed)
Steroid dose pack.

## 2017-09-27 NOTE — Telephone Encounter (Signed)
Pt returned call. She stated she is no longer taking the Trokendi. Her last dose was Monday night 8/26. After she started the medication she developed side effects, her brain felt slow, takes time to process things, felt confused, had numbness and tingling in the face (numbness/tingling resolved since she stopped the medication). She would like to try to let this get out of her system before starting other things however she is interested in possible steroid dose pack as this migraine has been going on for 4 days. Maxalt helps some. She has not taken the Tizanidine because it makes her sleepy and she has to work and tend to her children. Pt aware that Dr. Jaynee Eagles is out of the office right now but RN can speak with a work-in MD. Pt appreciative.

## 2017-10-02 ENCOUNTER — Other Ambulatory Visit: Payer: Self-pay | Admitting: Neurology

## 2017-10-02 MED ORDER — ZONISAMIDE 50 MG PO CAPS: 50.0000 mg | ORAL_CAPSULE | Freq: Every day | ORAL | 6 refills | Status: DC

## 2017-10-03 ENCOUNTER — Encounter

## 2017-10-03 ENCOUNTER — Ambulatory Visit: Payer: Self-pay | Admitting: Neurology

## 2017-10-03 ENCOUNTER — Other Ambulatory Visit: Payer: Self-pay | Admitting: Neurology

## 2017-10-03 MED ORDER — FREMANEZUMAB-VFRM 225 MG/1.5ML ~~LOC~~ SOSY: 225.0000 mg | PREFILLED_SYRINGE | SUBCUTANEOUS | 11 refills | Status: DC

## 2017-10-03 MED FILL — ZONISAMIDE 50 MG CAPSULE: 50 | 30 days supply | Qty: 30 | Fill #0

## 2017-10-04 ENCOUNTER — Telehealth: Payer: Self-pay | Admitting: *Deleted

## 2017-10-04 ENCOUNTER — Encounter: Payer: Self-pay | Admitting: *Deleted

## 2017-10-04 NOTE — Telephone Encounter (Signed)
Completed Ajovy PA. Key: [id number]. [facility] denied Ajovy.  Reference #: [id number] "The requested medication and/or diagnosis are not a covered benefit and are excluded from coverage in accordance with the terms and conditions of your plan benefit. Therefore, this request has been administratively denied. If the treating physician would like to discuss this coverage decision with the physician or health care professional reviewer, please call [facility] Prior Authorization department at [phone]."  Dr. Jaynee Eagles aware. Will have patient use the copay card. Patient emailed through [employer].

## 2017-10-08 ENCOUNTER — Other Ambulatory Visit: Payer: Self-pay | Admitting: *Deleted

## 2017-10-08 ENCOUNTER — Telehealth: Payer: Self-pay | Admitting: *Deleted

## 2017-10-08 DIAGNOSIS — G43711 Chronic migraine without aura, intractable, with status migrainosus: Secondary | ICD-10-CM

## 2017-10-08 MED ORDER — GALCANEZUMAB-GNLM 120 MG/ML ~~LOC~~ SOAJ: 240.0000 mg | Freq: Once | SUBCUTANEOUS | 0 refills | Status: AC

## 2017-10-08 NOTE — Telephone Encounter (Signed)
Completed pa for Emgality 240 mg loading dose on Cover My Meds. KEY: [id number]. Awaiting determination from [facility]. Could take 72 hours.

## 2017-10-09 NOTE — Telephone Encounter (Signed)
Spoke with Tiffany @ [place]. She stated that the [facility] is under review and will likely be approved to allow pt to get loading dose of 240 mg followed by 120 mg every 30 days going forward. Will await determination from insurance.

## 2017-10-09 NOTE — Telephone Encounter (Signed)
Jarrett Soho with [employer] calling stating Emgality 240mg  was approved but does not have dates. Please call [facility] to discuss quantity and day supply at [phone] per [facility].

## 2017-10-10 ENCOUNTER — Other Ambulatory Visit: Payer: Self-pay | Admitting: *Deleted

## 2017-10-10 MED ORDER — GALCANEZUMAB-GNLM 120 MG/ML ~~LOC~~ SOAJ: 120.0000 mg | SUBCUTANEOUS | 11 refills | Status: DC

## 2017-10-10 MED FILL — EMGALITY 120 MG/ML SOSY: 120 | 30 days supply | Qty: 2 | Fill #0

## 2017-10-10 NOTE — Telephone Encounter (Signed)
Meredith @ [place] has called re: the Prior Auth on the [employer], please [employer] @ [phone] (Phone)

## 2017-10-10 NOTE — Progress Notes (Signed)
Emgality maintenance dose ordered.

## 2017-10-10 NOTE — Telephone Encounter (Signed)
Received approval from [facility] for [employer]. Approved through 01/07/2018.   For any questions, call [phone].   [place] pharmacy and spoke with [facility] who was able to confirm that the Emgality loading dose went through insurance. She is aware that we will place the order for the maintenance Emgality of 120 mg every 30 days.

## 2017-10-11 ENCOUNTER — Encounter: Payer: Self-pay | Admitting: *Deleted

## 2017-10-11 NOTE — Telephone Encounter (Signed)
Note pt is no longer taking Trokendi. Insurance did return a determination of denial. [id number] Reason: not a covered benefit and is excluded from coverage.

## 2017-10-11 NOTE — Telephone Encounter (Signed)
Spoke with pharmacy @ [facility]. RN told that PA was approved for the [facility] syringe and they wanted to confirm that it was ok to fill that instead of the auto-injector since the PA was approved for syringe. D/w Dr. Jaynee Eagles and gave v.o. For adjustment.

## 2017-11-07 ENCOUNTER — Other Ambulatory Visit: Payer: Self-pay | Admitting: Family Medicine

## 2017-11-07 MED ORDER — PREDNISONE 20 MG PO TABS: 40.0000 mg | ORAL_TABLET | Freq: Every day | ORAL | 0 refills | Status: AC

## 2017-11-07 MED FILL — EMGALITY 120 MG/ML SOAJ: 120 | 30 days supply | Qty: 1 | Fill #0

## 2017-11-07 MED FILL — predniSONE 20 MG TABS: 20 | 5 days supply | Qty: 10 | Fill #0

## 2017-11-07 NOTE — Progress Notes (Signed)
Med for ETD sent.

## 2017-11-12 ENCOUNTER — Other Ambulatory Visit: Payer: Self-pay | Admitting: Neurology

## 2017-11-12 MED ORDER — ONDANSETRON 4 MG PO TBDP: 4.0000 mg | ORAL_TABLET | Freq: Three times a day (TID) | ORAL | 3 refills | Status: DC | PRN

## 2017-11-12 MED FILL — ONDANSETRON ODT 4 MG TABLET: 4 | 10 days supply | Qty: 30 | Fill #0

## 2017-11-19 ENCOUNTER — Other Ambulatory Visit: Payer: Self-pay | Admitting: Family Medicine

## 2017-11-19 MED ORDER — DOXYCYCLINE HYCLATE 100 MG PO TABS: 100.0000 mg | ORAL_TABLET | Freq: Two times a day (BID) | ORAL | 0 refills | Status: DC

## 2017-11-19 MED FILL — DOXYCYCLINE HYCLATE 100 MG: 100 | 10 days supply | Qty: 20 | Fill #0

## 2017-11-20 ENCOUNTER — Encounter: Payer: Self-pay | Admitting: Family Medicine

## 2017-11-22 ENCOUNTER — Other Ambulatory Visit: Payer: Self-pay | Admitting: Family Medicine

## 2017-11-22 MED ORDER — AMOXICILLIN-POT CLAVULANATE 875-125 MG PO TABS: 1.0000 | ORAL_TABLET | Freq: Two times a day (BID) | ORAL | 0 refills | Status: DC

## 2017-11-22 NOTE — Progress Notes (Signed)
Pt states she does well with PCN.

## 2017-11-26 ENCOUNTER — Encounter: Payer: Self-pay | Admitting: Family Medicine

## 2017-11-27 ENCOUNTER — Institutional Professional Consult (permissible substitution): Payer: 59 | Admitting: Neurology

## 2017-12-07 MED FILL — EMGALITY 120 MG/ML SOAJ: 120 | 30 days supply | Qty: 1 | Fill #1

## 2017-12-24 ENCOUNTER — Other Ambulatory Visit: Payer: Self-pay | Admitting: Family Medicine

## 2017-12-24 MED ORDER — AZITHROMYCIN 250 MG PO TABS: ORAL_TABLET | ORAL | 0 refills | Status: DC

## 2017-12-24 MED ORDER — BENZONATATE 100 MG PO CAPS: 100.0000 mg | ORAL_CAPSULE | Freq: Three times a day (TID) | ORAL | 0 refills | Status: DC | PRN

## 2017-12-24 MED FILL — AZITHROMYCIN 250 MG TABLET: 250 | 5 days supply | Qty: 6 | Fill #0

## 2017-12-24 MED FILL — BENZONATATE 100 MG CAPS: 100 | 7 days supply | Qty: 20 | Fill #0

## 2018-01-07 MED FILL — EMGALITY 120 MG/ML SOAJ: 120 | 30 days supply | Qty: 1 | Fill #2

## 2018-01-15 ENCOUNTER — Encounter: Payer: Self-pay | Admitting: Neurology

## 2018-01-15 ENCOUNTER — Ambulatory Visit: Payer: 59 | Admitting: Neurology

## 2018-01-15 DIAGNOSIS — G43709 Chronic migraine without aura, not intractable, without status migrainosus: Secondary | ICD-10-CM

## 2018-01-15 MED ORDER — ONDANSETRON 4 MG PO TBDP: 4.0000 mg | ORAL_TABLET | Freq: Three times a day (TID) | ORAL | 11 refills | Status: DC | PRN

## 2018-01-15 MED ORDER — RIZATRIPTAN BENZOATE 10 MG PO TABS: 10.0000 mg | ORAL_TABLET | ORAL | 11 refills | Status: DC | PRN

## 2018-01-15 MED ORDER — TIZANIDINE HCL 4 MG PO TABS: 4.0000 mg | ORAL_TABLET | Freq: Four times a day (QID) | ORAL | 6 refills | Status: DC | PRN

## 2018-01-15 MED FILL — RIZATRIPTAN BENZOATE 10 MG: 10 | 12 days supply | Qty: 4 | Fill #0

## 2018-01-15 NOTE — Progress Notes (Signed)
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 Started Topiramate but could not tolerate. Also started Ajovy (has latex allergy can't take aimovis) but insurance required [place] to try Emgality which was approved. Emgality is helping with migraines. Sent in zofran for acute management. She was on Depakote in the past, seizure-like events happen when angry and stressed. MRI of the brain was ordered but not completed. CBC and CMP were normal.   Having nausea after dose of Emgality. Takes Zofran prior to dose and this  helps. Has 5 headaches per month/ 3-4 migraines per month. This is a 50% improvement.  Couldn't tolerate Trokendi nor Zonegran due to anger and "psychotic episodes."   No seizure like activity since starting Emgality. Last episode was 06/2017.   Still snoring but does not feel it is as bad. 1-2 mornings she wakes up with headaches. She wakes daily with dry mouth. Having daytime sleepiness. Was advised sleep study   Interval history 09/19/2017: Has not seen patient in approx 2 years. Migraines worsened last year in the setting of stress. She has not taken Depakote in 4 months, she was tired of it. She had reported seizure in June of this year, discussed no driving restrictions. She stopped taking her Depakote. Over the last year she has 15 headache days a month and 8 are migrainous. Migraines are She has light sensitivity, sounds sensitivity, ears ringing, dizziness, vertigo, locations in the frontal area occ in the right occipital. Feels stabbing, throbbing. It can last up to 2 days. Unilateral migraines, pulsating and pounding and throbbing, She gets nausea no vomiting, She has light sensitivity, sounds sensitivity, ears ringing, dizziness, vertigo, locations in the frontal area occ in the right occipital. Feels  stabbing, throbbing. It can last up to 2 days and be severe. She gets nausea no vomiting. She has untreated sleep apnea. She takes excedrin every day. She has positional headaches and also vision blurriness and vision changes. Untreated sleep apnea with severe sleep apnea.  - requested records from previous sleep doctor, pending  - reviewed sleep report and data, AHI in 2011 to a max of 121 in REM sleep, was titrated on 10cm in 2011  Interval history 12/01/2015:  Patient is doing well. One migraine a month. Tolerating Depakote well. No more events of alteration of consciousness. The maxalt works with the zofran, she has not tried the [place]. It takes several hours for the maxalt to work, she has to take it twice. Suggest taking the maxalt with the zofran and cambia. She had hysterectomy.   HPI:  Marie Rios is a 37 y.o. female here as a referral from Dr. Vista Deck for Migraines and seizures in the setting of stress and anxiety.migraines Started 10 years ago. She has the migraines twice a month. She has light sensitivity, sounds sensitivity, ears ringing, dizziness, vertigo, locations in the frontal area occ in the right occipital. Feels stabbing, throbbing. It can last up to 2 days. She gets nausea no vomiting. She has diarrhea sometimes. She is on Depakote but just recently restarted, she was successfully treated with Depakote in the past but was feeling better and stopped it. She has had a hysterectomy. She has been on Depakote off and on for 20 years. Also on Dilantin. She has a history of seizures as well. The seizures always happen  when she gets stressed or with anger or anxiety , she passes out and jerks and shakes and wakes up confused. Workup has been negative. EEGs have been normal. Last time she had a seizure was on June 6th. She was seeing a neurologist. She was on Topiramate 200mg  twice a day. She was seeing a doctor in [place] and the Topiramate was too high. She was angry earlier this  month, she was frstrated, she "passed out". She says she got dizzy, vertigo, she came to with a cloth over her head and something cold over her neck. It lasted one minute. Husband says she will jerk than shake back and forth. Only when she stresses. This was the first one in a year. The seizures are always with stress or emotion that triggers it. She was on Depakote twice a day for many years. No other focal neurologic deficits or complaints.  Reviewed notes, labs and imaging from outside physicians, which showed: Patient was seen at [facility] for evaluation of vertigo, symptoms started in July 2012 with elevated Dilantin levels in the emergency room. Stress exacerbate her symptoms. She has participated and vestibular rehabilitation. The rehabilitation and improved her dizziness been no change in nausea. She reported associated seizures and migraines and associated sensitivity to light. On examination tests of ocular motor function revealed normal saccades, pursuit and optokinetic eye movements. There was no spontaneous nystagmus or gaze evoked nystagmus, rotary chair testing revealed normal vestibular ocular reflex gain, phase and symmetry at all frequencies testing indicating affective vestibular control. Dix-Hallpike's tests were negative for posterior semicircular canal BPPV, roll tests were negative for horizontal semicircular canal BPPV, caloric stimulation showed symmetric vestibular responses. Diagnosis was normal peripheral and central vestibular functions.  Review of Systems: Patient complains of symptoms per HPI as well as the following symptoms: No chest pain, no shortness of breath. Pertinent negatives per HPI. All others negative.   Social History   Socioeconomic History  . Marital status: Married    Spouse name: Aaron Edelman  . Number of children: 3  . Years of education: 95  . Highest education level: Not on file  Occupational History  . Occupation: [employer]    Comment: left dec 2018    . 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 Financial resource strain: Not on file  . Food insecurity:    Worry: Not on file    Inability: Not on file  . Transportation needs:    Medical: Not on file    Non-medical: Not on file  Tobacco Use  . Smoking status: Former Smoker    Packs/day: 0.50    Last attempt to quit: 2018    Years since quitting: 1.9  . Smokeless tobacco: Never Used  . Tobacco comment: black & mild  Substance and Sexual Activity  . Alcohol use: Not Currently    Comment: was social, no longer drinking   . Drug use: Not Currently    Comment: Marijuana- ocassionally (07/21/15)  . Sexual activity: Not on file  Lifestyle  . Physical activity:    Days per week: Not on file    Minutes per session: Not on file  . Stress: Not on file  Relationships  . Social connections:    Talks on phone: Not on file    Gets together: Not on file    Attends religious service: Not on file    Active member of club  or organization: Not on file    Attends meetings of clubs or organizations: Not on file    Relationship status: Not on file  . Intimate partner violence:    Fear of current or ex partner: Not on file    Emotionally abused: Not on file    Physically abused: Not on file    Forced sexual activity: Not on file  Other Topics Concern  . Not on file  Social History Narrative   Lives: with spouse and 2 of her children    Caffeine use: 1 cup of tea daily   Right handed    Family History  Problem Relation Age of Onset  . Heart attack Father        nov 2018  . Diabetes Other   . Cancer Other   . Heart disease Other   . Stroke Other   . Anemia Other   . Fibromyalgia Other   . Seizures Neg Hx   . Migraines Neg Hx     Past Medical History:  Diagnosis Date  . Anxiety   . Asthma   . Breast cyst, left 2019  . Depression   . Fibromyalgia   . History of stomach ulcers   . Lumbar herniated disc   .  Migraine   . Seizures ([place])     Past Surgical History:  Procedure Laterality Date  . APPENDECTOMY  1996  . CHOLECYSTECTOMY  2003  . LAPAROSCOPIC OOPHERECTOMY Right 2006  . LAPAROSCOPIC OOPHERECTOMY Left 2007  . pevlic surgery  9767   Plevic mass  . TUBAL LIGATION  2003  . VAGINAL HYSTERECTOMY  2005    Current Outpatient Medications  Medication Sig Dispense Refill  . Bismuth Subsalicylate (PEPTO-BISMOL PO) Take 1 tablet by mouth as needed.    . Estradiol (ESTRACE PO) Take 1.25 mg by mouth daily.    . Galcanezumab-gnlm (EMGALITY) 120 MG/ML SOAJ Inject 120 mg into the skin every 30 (thirty) days. 1 pen 11  . ondansetron (ZOFRAN-ODT) 4 MG disintegrating tablet Take 1 tablet (4 mg total) by mouth every 8 (eight) hours as needed for nausea or vomiting. 20 tablet 11  . rizatriptan (MAXALT) 10 MG tablet Take 1 tablet (10 mg total) by mouth as needed for migraine. May repeat in 2 hours if needed. Max 2 doses in 24 hours. 10 tablet 11  . tiZANidine (ZANAFLEX) 4 MG tablet Take 1 tablet (4 mg total) by mouth every 6 (six) hours as needed for muscle spasms. 90 tablet 6   No current facility-administered medications for this visit.     Allergies as of 01/15/2018 - Review Complete 01/15/2018  Allergen Reaction Noted  . Keflex [cephalexin]  07/21/2015  . Latex Other (See Comments) 07/21/2015  . Ciprofloxacin Rash 07/21/2015    Vitals: BP 114/77 (BP Location: Right Arm, Patient Position: Sitting)   Pulse 77   Ht 5\' 4"  (1.626 m)   Wt 220 lb (99.8 kg)   BMI 37.76 kg/m  Last Weight:  Wt Readings from Last 1 Encounters:  01/15/18 220 lb (99.8 kg)   Last Height:   Ht Readings from Last 1 Encounters:  01/15/18 5\' 4"  (1.626 m)   Physical exam: Exam: Gen: NAD, conversant, well nourised, obese, well groomed                     CV: RRR, no MRG. No Carotid Bruits. No peripheral edema, warm, nontender Eyes: Conjunctivae clear without exudates or hemorrhage  Neuro: Detailed Neurologic  Exam  Speech:    Speech is normal; fluent and spontaneous with normal comprehension.  Cognition:    The patient is oriented to person, place, and time;     recent and remote memory intact;     language fluent;     normal attention, concentration,     fund of knowledge Cranial Nerves:    The pupils are equal, round, and reactive to light. The fundi are normal and spontaneous venous pulsations are present. Visual fields are full to finger confrontation. Extraocular movements are intact. Trigeminal sensation is intact and the muscles of mastication are normal. The face is symmetric. The palate elevates in the midline. Hearing intact. Voice is normal. Shoulder shrug is normal. The tongue has normal motion without fasciculations.   Coordination:    Normal finger to nose and heel to shin. Normal rapid alternating movements.   Gait:    Heel-toe and tandem gait are normal.   Motor Observation:    No asymmetry, no atrophy, and no involuntary movements noted. Tone:    Normal muscle tone.    Posture:    Posture is normal. normal erect    Strength:    Strength is V/V in the upper and lower limbs.      Sensation: intact to LT     Reflex Exam:  DTR's:    Deep tendon reflexes in the upper and lower extremities are normal bilaterally.   Toes:    The toes are downgoing bilaterally.   Clonus:    Clonus is absent.       Assessment/Plan:  This is a 38 year old female here for migraines and seizures. I'm not entirely convinced patient has seizures, episodes happen in the setting of stress and anger and anxiety and patient appears to have psychiatric disorder. She was treated in the past successfully with Depakote but stopped it 4 months ago on her own.  - Medication overuse: improved, she has stopped taking excedrin and others - Untreated sleep apnea and morning headaches: Had sleep test in [place] 4-5 years ago, Dr. Casandra Doffing requesting records. Needs new slp evaluation, sleep  study will order. ESS 16. AHI During REM was 121, severe sleep apnea - Discussed [place] laws regarding driving and seizure-like events - MRI brain due to concerning symptoms of morning headaches, positional headaches,vision changes  to look for space occupying mass, chiari or intracranial hypertension (pseudotumor). Di dnot ocmplete, feels better - Did not tolreate trokendi or zonegran, severe side effects - ding better on emgality. Explained her severe sleep apnea may be causing headaches too. - Keep me udated through email on mychart - gave phone number to call and reschedule sleep appointment  Do not get pregnant, discussed high risk of teratogenicity, she has had hysterectomy  Discussed Patients with epilepsy have a small risk of sudden unexpected death, a condition referred to as sudden unexpected death in epilepsy (SUDEP). SUDEP is defined specifically as the sudden, unexpected, witnessed or unwitnessed, nontraumatic and nondrowning death in patients with epilepsy with or without evidence for a seizure, and excluding documented status epilepticus, in which post mortem examination does not reveal a structural or toxicologic cause for death    To prevent or relieve headaches, try the following:  Cool Compress. Lie down and place a cool compress on your head.   Avoid headache triggers. If certain foods or odors seem to have triggered your migraines in the past, avoid them. A headache diary might help you identify triggers.   Include physical activity in your  daily routine. Try a daily walk or other moderate aerobic exercise.   Manage stress. Find healthy ways to cope with the stressors, such as delegating tasks on your to-do list.   Practice relaxation techniques. Try deep breathing, yoga, massage and visualization.   Eat regularly. Eating regularly scheduled meals and maintaining a healthy diet might help prevent headaches. Also, drink plenty of fluids.   Follow a regular sleep  schedule. Sleep deprivation might contribute to headaches  Consider biofeedback. With this mind-body technique, you learn to control certain bodily functions - such as muscle tension, heart rate and blood pressure - to prevent headaches or reduce headache pain.    Proceed to emergency room if you experience new or worsening symptoms or symptoms do not resolve, if you have new neurologic symptoms or if headache is severe, or for any concerning symptom.    C: Dr. Vista Deck  A total of 25 minutes was spent face-to-face with this patient. Over half this time was spent on counseling patient on the  1. Chronic migraine without aura without status migrainosus, not intractable      diagnosis and different diagnostic and therapeutic options, counseling and coordination of care, risks ans benefits of management, compliance, or risk factor reduction and education.    Sarina Ill, MD  [facility] [address] [place],  [address]  Phone [phone] Fax [phone]  A total of 45 minutes was spent face-to-face with this patient. Over half this time was spent on counseling patient on the migraine diagnosis and different diagnostic and therapeutic options available.

## 2018-01-24 ENCOUNTER — Encounter: Payer: Self-pay | Admitting: Family Medicine

## 2018-01-24 ENCOUNTER — Ambulatory Visit: Payer: 59 | Admitting: Family Medicine

## 2018-01-24 VITALS — BP 118/74 | HR 77 | Temp 97.7°F | Resp 16

## 2018-01-24 DIAGNOSIS — J0100 Acute maxillary sinusitis, unspecified: Secondary | ICD-10-CM | POA: Diagnosis not present

## 2018-01-24 MED ORDER — METHYLPREDNISOLONE ACETATE 80 MG/ML IJ SUSP
80.0000 mg | Freq: Once | INTRAMUSCULAR | Status: AC
  Administered 2018-01-24: 80 mg via INTRAMUSCULAR

## 2018-01-24 MED ORDER — HYDROCODONE-HOMATROPINE 5-1.5 MG/5ML PO SYRP: 5.0000 mL | ORAL_SOLUTION | Freq: Three times a day (TID) | ORAL | 0 refills | Status: DC | PRN

## 2018-01-24 MED ORDER — FLUCONAZOLE 150 MG PO TABS: 150.0000 mg | ORAL_TABLET | Freq: Once | ORAL | 0 refills | Status: AC

## 2018-01-24 MED ORDER — DOXYCYCLINE HYCLATE 100 MG PO TABS: 100.0000 mg | ORAL_TABLET | Freq: Two times a day (BID) | ORAL | 0 refills | Status: AC

## 2018-01-24 MED FILL — DOXYCYCLINE HYCLATE 100 MG: 100 | 7 days supply | Qty: 14 | Fill #0

## 2018-01-24 MED FILL — HYDROCODONE-HOMATROPINE SOL: 5-1.5 | 8 days supply | Qty: 120 | Fill #0

## 2018-01-24 MED FILL — FLUCONAZOLE 150 MG TABS: 150 | 1 days supply | Qty: 1 | Fill #0

## 2018-01-24 NOTE — Progress Notes (Signed)
Chief Complaint  Patient presents with  . URI    congestion, ear itching and popping, itchy throat-improving, coughing-bloody phlem     Marie Rios here for URI complaints.  Duration: 2 months  Associated symptoms: sinus congestion, itchy throat, sinus pain, rhinorrhea, itchy watery eyes, ear fullness, shortness of breath and cough Denies: ear pain, ear drainage, sore throat, wheezing, shortness of breath, myalgia, dental pain, and fevers Treatment to date: cetirizine, Flonase Sick contacts: Yes  ROS:  Const: Denies fevers HEENT: As noted in HPI Lungs: No SOB  Past Medical History:  Diagnosis Date  . Anxiety   . Asthma   . Breast cyst, left 2019  . Depression   . Fibromyalgia   . History of stomach ulcers   . Lumbar herniated disc   . Migraine   . Seizures ([facility])     BP 118/74 (BP Location: Right Arm, Patient Position: Sitting, Cuff Size: Large)   Pulse 77   Temp 97.7 F (36.5 C) (Oral)   Resp 16   SpO2 98%  General: Awake, alert, appears stated age HEENT: AT, , ears patent b/l and TM's neg, nares patent w/o discharge, pharynx pink and without exudates, +ttp over max sinuses b/l, MMM Neck: No masses or asymmetry Heart: RRR Lungs: CTAB, no accessory muscle use Psych: Age appropriate judgment and insight, normal mood and affect  Acute maxillary sinusitis, recurrence not specified - Plan: fluconazole (DIFLUCAN) 150 MG tablet, doxycycline (VIBRA-TABS) 100 MG tablet, HYDROcodone-homatropine (HYCODAN) 5-1.5 MG/5ML syrup  Depo IM today. Wait a few days, if no improvement, use abx. Syrup for symptom management, I think we can avoid CXR for now. If continued cough, will have to pursue.  Continue to push fluids, practice good hand hygiene, cover mouth when coughing. F/u prn. If starting to experience fevers, shaking, or shortness of breath, seek immediate care. Pt voiced understanding and agreement to the plan.  Verdi, DO 01/24/18 9:36 AM

## 2018-01-24 NOTE — Patient Instructions (Signed)
Wait a couple days before taking doxy. If no improvement, take the doxycyline.   Continue the nasal spray daily and also daily use of cetirizine.   Continue to push fluids, practice good hand hygiene, and cover your mouth if you cough.  If you start having fevers, shaking or shortness of breath, seek immediate care.  Let us know if you need anything.

## 2018-01-30 HISTORY — PX: CERVICAL SPINE SURGERY: SHX589

## 2018-02-07 ENCOUNTER — Telehealth: Payer: Self-pay | Admitting: *Deleted

## 2018-02-07 MED FILL — EMGALITY 120 MG/ML SOAJ: 120 | 30 days supply | Qty: 1 | Fill #3

## 2018-02-07 NOTE — Telephone Encounter (Signed)
Received approval for Emgality 120 mg through 02/08/2019. Messaged pt through [employer].  Reference # [id number]

## 2018-02-07 NOTE — Telephone Encounter (Signed)
Emgality 120 mg PA completed on Cover My Meds. KEY; [id number]. Anticipate determination from [facility]. No time-frame given.

## 2018-03-25 ENCOUNTER — Encounter: Payer: Self-pay | Admitting: Neurology

## 2018-03-25 ENCOUNTER — Ambulatory Visit (INDEPENDENT_AMBULATORY_CARE_PROVIDER_SITE_OTHER): Payer: 59 | Admitting: Neurology

## 2018-03-25 VITALS — BP 110/78 | HR 72 | Ht 64.0 in | Wt 222.0 lb

## 2018-03-25 DIAGNOSIS — E669 Obesity, unspecified: Secondary | ICD-10-CM

## 2018-03-25 DIAGNOSIS — Z820 Family history of epilepsy and other diseases of the nervous system: Secondary | ICD-10-CM

## 2018-03-25 DIAGNOSIS — R351 Nocturia: Secondary | ICD-10-CM

## 2018-03-25 DIAGNOSIS — R51 Headache: Secondary | ICD-10-CM

## 2018-03-25 DIAGNOSIS — G4733 Obstructive sleep apnea (adult) (pediatric): Secondary | ICD-10-CM | POA: Diagnosis not present

## 2018-03-25 DIAGNOSIS — Z789 Other specified health status: Secondary | ICD-10-CM

## 2018-03-25 DIAGNOSIS — R519 Headache, unspecified: Secondary | ICD-10-CM

## 2018-03-25 NOTE — Patient Instructions (Signed)
Thank you for choosing [facility] for your sleep related care! It was nice to meet you today! I appreciate that you entrust me with your sleep related healthcare concerns. I hope, I was able to address at least some of your concerns today, and that I can help you feel reassured and also get better.    Here is what we discussed today and what we came up with as our plan for you:    Based on your symptoms and your exam I believe you may still be at risk for obstructive sleep apnea (aka OSA), and I think we should proceed with a sleep study to determine whether you do or do not have OSA and how severe it is. Even, if you have mild OSA, I may want you to consider treatment with CPAP, as treatment of even borderline or mild sleep apnea can result and improvement of symptoms such as sleep disruption, daytime sleepiness, nighttime bathroom breaks, restless leg symptoms, improvement of headache syndromes, even improved mood disorder.   Please remember, the long-term risks and ramifications of untreated moderate to severe obstructive sleep apnea are: increased Cardiovascular disease, including congestive heart failure, stroke, difficult to control hypertension, treatment resistant obesity, arrhythmias, especially irregular heartbeat commonly known as A. Fib. (atrial fibrillation); even type 2 diabetes has been linked to untreated OSA.   Sleep apnea can cause disruption of sleep and sleep deprivation in most cases, which, in turn, can cause recurrent headaches, problems with memory, mood, concentration, focus, and vigilance. Most people with untreated sleep apnea report excessive daytime sleepiness, which can affect their ability to drive. Please do not drive if you feel sleepy. Patients with sleep apnea developed difficulty initiating and maintaining sleep (aka insomnia).   Having sleep apnea may increase your risk for other sleep disorders, including involuntary behaviors sleep such as sleep  terrors, sleep talking, sleepwalking.    Having sleep apnea can also increase your risk for restless leg syndrome and leg movements at night.   Please note that untreated obstructive sleep apnea may carry additional perioperative morbidity. Patients with significant obstructive sleep apnea (typically, in the moderate to severe degree) should receive, if possible, perioperative PAP (positive airway pressure) therapy and the surgeons and particularly the anesthesiologists should be informed of the diagnosis and the severity of the sleep disordered breathing.   I will likely see you back after your sleep study to go over the test results and where to go from there. We will call you after your sleep study to advise about the results (most likely, you will hear from Kristen, my nurse) and to set up an appointment at the time, as necessary.    Our sleep lab administrative assistant will call you to schedule your sleep study and give you further instructions, regarding the check in process for the sleep study, arrival time, what to bring, when you can expect to leave after the study, etc., and to answer any other logistical questions you may have. If you don't hear back from her by about 2 weeks from now, please feel free to call her direct line at [phone] or you can call our general clinic number, or email us through My Chart.   

## 2018-03-25 NOTE — Progress Notes (Signed)
Subjective:    Patient ID: GAYLEEN SHOLTZ is a 38 y.o. female.  HPI     Star Age, MD, PhD [facility] [address]O. [place], [place] [address]  Dear Berta Minor,   I saw your patient, Prestina Raigoza, upon your kind request in the sleep clinic today for initial consultation of her sleep disorder, in particular, concern for underlying obstructive sleep apnea. The patient is unaccompanied today. As you know, Ms. Varano is a 38 year old right-handed woman with an underlying medical history of migraine headaches, history of stomach ulcer, fibromyalgia, depression, anxiety, asthma, previous diagnosis of possible seizures, and obesity, who reports snoring and excessive daytime somnolence as well as occasional morning headaches. I reviewed your office note from 01/15/2018. She reports having had a sleep study in the past and was diagnosed with sleep apnea, she could not tolerate CPAP at the time. Prior sleep study results are not available for my review today. She is married and lives with her spouse and 2 of her 3 children. She works for [employer], front office. She quit smoking in 2018, she does not typically utilize alcohol and drinks caffeine in the form of soda, up to 16 ounce per day on average. Her Epworth sleepiness score is 11 out of 24 today, fatigue score is 15 out of 63. She has a family history of sleep apnea in her mother, sister and maternal aunt, all with CPAP machines. She had difficulty tolerating the mask and reports feeling claustrophobic. She is not sure if she could tolerate CPAP again this time around but is willing to get tested for sleep apnea. She does not typically wake up fully rested. She has nocturia about once or twice per average night, she has woken up with a headache. Bedtime is generally around 10 or 10:30 PM and rise time around 6.  Her Past Medical History Is Significant For: Past Medical History:  Diagnosis Date  .  Anxiety   . Asthma   . Breast cyst, left 2019  . Depression   . Fibromyalgia   . History of stomach ulcers   . Lumbar herniated disc   . Migraine   . Seizures ([place])     Her Past Surgical History Is Significant For: Past Surgical History:  Procedure Laterality Date  . APPENDECTOMY  1996  . CHOLECYSTECTOMY  2003  . LAPAROSCOPIC OOPHERECTOMY Right 2006  . LAPAROSCOPIC OOPHERECTOMY Left 2007  . pevlic surgery  0539   Plevic mass  . TUBAL LIGATION  2003  . VAGINAL HYSTERECTOMY  2005    Her Family History Is Significant For: Family History  Problem Relation Age of Onset  . Heart attack Father        nov 2018  . Diabetes Other   . Cancer Other   . Heart disease Other   . Stroke Other   . Anemia Other   . Fibromyalgia Other   . Seizures Neg Hx   . Migraines Neg Hx     Her Social History Is Significant For: Social History   Socioeconomic History  . Marital status: Married    Spouse name: Aaron Edelman  . Number of children: 3  . Years of education: 19  . Highest education level: Not on file  Occupational History  . Occupation: [employer]    Comment: left dec 2018  . 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  Financial resource strain: Not on file  . Food insecurity:    Worry: Not on file    Inability: Not on file  . Transportation needs:    Medical: Not on file    Non-medical: Not on file  Tobacco Use  . Smoking status: Former Smoker    Packs/day: 0.50    Last attempt to quit: 2018    Years since quitting: 2.1  . Smokeless tobacco: Never Used  . Tobacco comment: black & mild  Substance and Sexual Activity  . Alcohol use: Not Currently    Comment: was social, no longer drinking   . Drug use: Not Currently    Comment: Marijuana- ocassionally (07/21/15)  . Sexual activity: Not on file  Lifestyle  . Physical activity:    Days per week: Not on file    Minutes per session: Not on file  . Stress:  Not on file  Relationships  . Social connections:    Talks on phone: Not on file    Gets together: Not on file    Attends religious service: Not on file    Active member of club or organization: Not on file    Attends meetings of clubs or organizations: Not on file    Relationship status: Not on file  Other Topics Concern  . Not on file  Social History Narrative   Lives: with spouse and 2 of her children    Caffeine use: 1 cup of tea daily   Right handed    Her Allergies Are:  Allergies  Allergen Reactions  . Keflex [Cephalexin]     Reflux  . Latex Other (See Comments)    Rash  . Ciprofloxacin Rash  :   Her Current Medications Are:  Outpatient Encounter Medications as of 03/25/2018  Medication Sig  . Bismuth Subsalicylate (PEPTO-BISMOL PO) Take 1 tablet by mouth as needed.  . Estradiol (ESTRACE PO) Take 1.25 mg by mouth daily.  . Galcanezumab-gnlm (EMGALITY) 120 MG/ML SOAJ Inject 120 mg into the skin every 30 (thirty) days.  Marland Kitchen HYDROcodone-homatropine (HYCODAN) 5-1.5 MG/5ML syrup Take 5 mLs by mouth every 8 (eight) hours as needed for cough.  . ondansetron (ZOFRAN-ODT) 4 MG disintegrating tablet Take 1 tablet (4 mg total) by mouth every 8 (eight) hours as needed for nausea or vomiting.  . rizatriptan (MAXALT) 10 MG tablet Take 1 tablet (10 mg total) by mouth as needed for migraine. May repeat in 2 hours if needed. Max 2 doses in 24 hours.  Marland Kitchen tiZANidine (ZANAFLEX) 4 MG tablet Take 1 tablet (4 mg total) by mouth every 6 (six) hours as needed for muscle spasms.   No facility-administered encounter medications on file as of 03/25/2018.   :  Review of Systems:  Out of a complete 14 point review of systems, all are reviewed and negative with the exception of these symptoms as listed below: Review of Systems  Neurological:       Pt presents today to discuss her sleep. Pt has had a sleep study in the past that showed she had osa. Pt tried a cpap but could not tolerate it. Pt does  endorse snoring.  Epworth Sleepiness Scale 0= would never doze 1= slight chance of dozing 2= moderate chance of dozing 3= high chance of dozing  Sitting and reading: 2 Watching TV: 1 Sitting inactive in a public place (ex. Theater or meeting): 3 As a passenger in a car for an hour without a break: 3 Lying down to rest in  the afternoon: 1 Sitting and talking to someone: 0 Sitting quietly after lunch (no alcohol): 1 In a car, while stopped in traffic: 0 Total: 11     Objective:  Neurological Exam  Physical Exam Physical Examination:   Vitals:   03/25/18 1612  BP: 110/78  Pulse: 72  SpO2: 99%    General Examination: The patient is a very pleasant 38 y.o. female in no acute distress. She appears well-developed and well-nourished and well groomed.   HEENT: Normocephalic, atraumatic, pupils are equal, round and reactive to light and accommodation. Extraocular tracking is good without limitation to gaze excursion or nystagmus noted. Normal smooth pursuit is noted. Hearing is grossly intact. Face is symmetric with normal facial animation and normal facial sensation. Speech is clear with no dysarthria noted. There is no hypophonia. There is no lip, neck/head, jaw or voice tremor. Neck is supple with full range of passive and active motion. There are no carotid bruits on auscultation. Oropharynx exam reveals: mild mouth dryness, adequate dental hygiene and moderate airway crowding, due to smaller airway entry, longer uvula, tonsils in place, about 1-2+ in size. Mallampati is class II. Neck circumference is 13-7/8 inches. Tongue protrudes centrally and palate elevates symmetrically.   Chest: Clear to auscultation without wheezing, rhonchi or crackles noted.  Heart: S1+S2+0, regular and normal without murmurs, rubs or gallops noted.   Abdomen: Soft, non-tender and non-distended with normal bowel sounds appreciated on auscultation.  Extremities: There is no pitting edema in the distal  lower extremities bilaterally. Pedal pulses are intact.  Skin: Warm and dry without trophic changes noted.  Musculoskeletal: exam reveals no obvious joint deformities, tenderness or joint swelling or erythema.   Neurologically:  Mental status: The patient is awake, alert and oriented in all 4 spheres. Her immediate and remote memory, attention, language skills and fund of knowledge are appropriate. There is no evidence of aphasia, agnosia, apraxia or anomia. Speech is clear with normal prosody and enunciation. Thought process is linear. Mood is normal and affect is normal.  Cranial nerves II - XII are as described above under HEENT exam. In addition: shoulder shrug is normal with equal shoulder height noted. Motor exam: Normal bulk, strength and tone is noted. There is no drift, tremor or rebound. Romberg is negative. Fine motor skills and coordination: grossly intact.  Cerebellar testing: No dysmetria or intention tremor on finger to nose testing. Heel to shin is unremarkable bilaterally. There is no truncal or gait ataxia.  Sensory exam: intact to light touch in the upper and lower extremities.  Gait, station and balance: She stands easily. No veering to one side is noted. No leaning to one side is noted. Posture is age-appropriate and stance is narrow based. Gait shows normal stride length and normal pace. No problems turning are noted. Tandem walk is unremarkable.   Assessment and Plan:  In summary, Amreen H Pottle is a very pleasant 38 y.o.-year old female with an underlying medical history of migraine headaches, history of stomach ulcer, fibromyalgia, depression, anxiety, asthma, previous diagnosis of possible seizures, and obesity, who presents for reevaluation of her prior diagnosis of obstructive sleep apnea (OSA). I had a long chat with the patient about my findings and the diagnosis of OSA, its prognosis and treatment options. We talked about medical treatments, surgical interventions and  non-pharmacological approaches. I explained in particular the risks and ramifications of untreated moderate to severe OSA, especially with respect to developing cardiovascular disease down the Road, including congestive heart failure, difficult  to treat hypertension, cardiac arrhythmias, or stroke. Even type 2 diabetes has, in part, been linked to untreated OSA. Symptoms of untreated OSA include daytime sleepiness, memory problems, mood irritability and mood disorder such as depression and anxiety, lack of energy, as well as recurrent headaches, especially morning headaches. We talked about trying to maintain a healthy lifestyle in general, as well as the importance of weight control. I encouraged the patient to eat healthy, exercise daily and keep well hydrated, to keep a scheduled bedtime and wake time routine, to not skip any meals and eat healthy snacks in between meals. I advised the patient not to drive when feeling sleepy. I recommended the following at this time: sleep study with potential positive airway pressure titration. (We will score hypopneas at 4%).   I explained the sleep test procedure to the patient and also outlined possible surgical and non-surgical treatment options of OSA, including the use of a custom-made dental device (which would require a referral to a specialist dentist or oral surgeon), upper airway surgical options, such as pillar implants, radiofrequency surgery, tongue base surgery, and UPPP (which would involve a referral to an ENT surgeon) and the [facility] device.  I also explained the CPAP treatment option to the patient, who indicated that would be willing to try CPAP again, if the need arises. I explained the importance of being compliant with PAP treatment, not only for insurance purposes but primarily to improve Her symptoms, and for the patient's long term health benefit, including to reduce Her cardiovascular risks. I answered all her questions today and the patient was  in agreement. I plan to see her back after the sleep study is completed and encouraged her to call with any interim questions, concerns, problems or updates.   Thank you very much for allowing me to participate in the care of this nice patient. If I can be of any further assistance to you please do not hesitate to call me at [phone].  Sincerely,   Star Age, MD, PhD

## 2018-04-01 ENCOUNTER — Ambulatory Visit: Payer: 59 | Admitting: Family Medicine

## 2018-04-01 ENCOUNTER — Encounter: Payer: Self-pay | Admitting: Family Medicine

## 2018-04-01 VITALS — BP 100/70 | HR 81 | Temp 97.9°F | Ht 64.0 in | Wt 217.2 lb

## 2018-04-01 DIAGNOSIS — G43119 Migraine with aura, intractable, without status migrainosus: Secondary | ICD-10-CM

## 2018-04-01 MED ORDER — PROMETHAZINE HCL 25 MG/ML IJ SOLN
25.0000 mg | Freq: Once | INTRAMUSCULAR | Status: AC
  Administered 2018-04-01: 25 mg via INTRAMUSCULAR

## 2018-04-01 MED ORDER — KETOROLAC TROMETHAMINE 60 MG/2ML IM SOLN
60.0000 mg | Freq: Once | INTRAMUSCULAR | Status: AC
  Administered 2018-04-01: 60 mg via INTRAMUSCULAR

## 2018-04-01 MED ORDER — ONDANSETRON HCL 4 MG PO TABS: 4.0000 mg | ORAL_TABLET | Freq: Three times a day (TID) | ORAL | 0 refills | Status: DC | PRN

## 2018-04-01 MED ORDER — DIPHENHYDRAMINE HCL 50 MG/ML IJ SOLN
50.0000 mg | Freq: Once | INTRAMUSCULAR | Status: AC
  Administered 2018-04-01: 50 mg via INTRAMUSCULAR

## 2018-04-01 NOTE — Progress Notes (Signed)
Chief Complaint  Patient presents with  . Headache    passed out last night  . Blurred Vision  . Dizziness     Marie Rios is a 38 y.o. female here for evaluation of an acute headache.  She is here with her husband.  Duration: 1 day  Laterality: b/l- frontal Quality: stabbing, ache  Has been having blurred vision and dizziness.  The dizziness is worse than usual with her migraines. No difficulty with speech, trouble swallowing, or weakness unrelated to the pain/misery. She did pass out yesterday.  She was up to get some fluids and felt dizzy or and dizzy.  She fell and hit her head.  Her husband heard and came to her aid.  She was groggy afterwards but there are no reports of convulsions, bowel/bladder incontinence, or biting of the tongue. Patient is also having nausea and not eating as much as usual.  She did deny palpitations.  ROS:  Neuro: +HA  Past Medical History:  Diagnosis Date  . Anxiety   . Asthma   . Breast cyst, left 2019  . Depression   . Fibromyalgia   . History of stomach ulcers   . Lumbar herniated disc   . Migraine   . Seizures ([facility])    BP 100/70 (BP Location: Left Arm, Patient Position: Sitting, Cuff Size: Large)   Pulse 81   Temp 97.9 F (36.6 C) (Oral)   Ht 5\' 4"  (1.626 m)   Wt 217 lb 4 oz (98.5 kg)   SpO2 96%   BMI 37.29 kg/m  Gen: awake, alert, appearing stated age Eyes: PERRLA, EOMi, no injection Heart: RRR Lungs: CTAB, no accessory muscle use Abd: BS+, soft, NT, ND Neuro: CN2-12 grossly intact, fluent and goal-oriented speech, DTR's equal and symmetric in UE's and LE's MSK: 5/5 strength throughout, no TTP over cervical paraspinal musculature or occipital triangle region Psych: Age appropriate judgment and insight, normal affect and mood  Intractable migraine with aura without status migrainosus - Plan: ketorolac (TORADOL) injection 60 mg, promethazine (PHENERGAN) injection 25 mg, diphenhydrAMINE (BENADRYL) injection 50 mg  Letter for  work given.  Migraine cocktail as above.  I believe with a combination of dizziness from her migraine and decreased oral intake, this could have contributed to her passing out episode.  She does not have any red flag signs or symptoms on exam.  If this happens again or if she does not improve with her dizziness, she is to let me know.  I would have a low threshold to scan her at that point. F/u prn. The pt voiced understanding and agreement to the plan.  [place], DO 04/01/18 12:24 PM

## 2018-04-01 NOTE — Patient Instructions (Signed)
Let me know if the dizziness does not get better or if you pass out again.  OK to take Tylenol 1000 mg (2 extra strength tabs) or 975 mg (3 regular strength tabs) every 6 hours as needed.  Let us know if you need anything.

## 2018-04-03 ENCOUNTER — Other Ambulatory Visit: Payer: Self-pay | Admitting: Family Medicine

## 2018-04-03 ENCOUNTER — Encounter: Payer: Self-pay | Admitting: Family Medicine

## 2018-04-03 ENCOUNTER — Encounter: Payer: Self-pay | Admitting: Neurology

## 2018-04-03 MED ORDER — PREDNISONE 50 MG PO TABS: ORAL_TABLET | ORAL | 0 refills | Status: DC

## 2018-04-08 ENCOUNTER — Ambulatory Visit: Payer: 59 | Admitting: Family Medicine

## 2018-04-08 ENCOUNTER — Encounter: Payer: Self-pay | Admitting: Neurology

## 2018-04-08 ENCOUNTER — Encounter: Payer: Self-pay | Admitting: Family Medicine

## 2018-04-08 VITALS — BP 125/78 | HR 68 | Ht 64.0 in | Wt 220.4 lb

## 2018-04-08 DIAGNOSIS — G43711 Chronic migraine without aura, intractable, with status migrainosus: Secondary | ICD-10-CM | POA: Diagnosis not present

## 2018-04-08 DIAGNOSIS — G43109 Migraine with aura, not intractable, without status migrainosus: Secondary | ICD-10-CM | POA: Diagnosis not present

## 2018-04-08 MED ORDER — UBROGEPANT 100 MG PO TABS: 100.0000 mg | ORAL_TABLET | Freq: Every day | ORAL | 0 refills | Status: DC | PRN

## 2018-04-08 NOTE — Progress Notes (Signed)
PATIENT: Marie Rios DOB: Aug 13, 1980  REASON FOR VISIT: follow up HISTORY FROM: patient  Chief Complaint  Patient presents with  . Follow-up    Migraine and dizziness follow up. Husband present. Rm 9. Patient mentioned that she had a syncope episode last week. She mentioned that she received a migraine cocktail and she received some prednisone that she finished yesterday. She stated that she has a dull headache at present. No dizziness at present.      HISTORY OF PRESENT ILLNESS: Today 04/08/18 Marie Rios is a 38 y.o. female here today for follow up for migraines and dizziness (chronic). She was seen on 3/2 by PCP for worsening migraines and an episode of what she feels was syncope. She reports that she fell and hit her head after standing to go to the kitchen. Her husband came to her aid after hearing her fall. She was sluggish following but no worrisome findings. No seizure like activity, incontinence, or biting tongue. She was given migraine cocktail of Torodol, Phenergan and Benadryl in office. On 3/4 she contacted PCP with reports of another syncopal episode (after standing up) and no improvement of headache. She was given steroid burst. She did not feel that this helped her headache but did improve dizziness. She reports getting dizzy with standing or turning her head too fast. She is not drinking water regularly. She is taking Emgality monthly as prescribed. She uses Maxalt, Zofran and tizanidine as needed for abortive therapy. She reports that this combination usually helps but has not this past week. She admits that she does not drink much fluids throughout the day.   She is being evaluated by Dr Rexene Alberts for possible sleep apnea and has upcoming sleep study pending insurance approval.   She has questionable seizure activity when angry or stressed. She reports last event was 06/2017. She has not taken Depakote in 2 years.    HISTORY: (copied from  note on  01/15/2018) Interval update for migraines (seziures may be non-epileptic events). Started Topiramate but could not tolerate. Also started Ajovy (has latex allergy can't take aimovis) but insurance required [place] to try Emgality which was approved. Emgality is helping with migraines. Sent in zofran for acute management. She was on Depakote in the past, seizure-like events happen when angry and stressed. MRI of the brain was ordered but not completed. CBC and CMP were normal.   Having nausea after dose of Emgality. Takes Zofran prior to dose and this  helps. Has 5 headaches per month/ 3-4 migraines per month. This is a 50% improvement.  Couldn't tolerate Trokendi nor Zonegran due to anger and "psychotic episodes."   No seizure like activity since starting Emgality. Last episode was 06/2017.   Still snoring but does not feel it is as bad. 1-2 mornings she wakes up with headaches. She wakes daily with dry mouth. Having daytime sleepiness. Was advised sleep study   Interval history 09/19/2017: Has not seen patient in approx 2 years. Migraines worsened last year in the setting of stress. She has not taken Depakote in 4 months, she was tired of it. She had reported seizure in June of this year, discussed no driving restrictions. She stopped taking her Depakote. Over the last year she has 15 headache days a month and 8 are migrainous. Migraines are She has light sensitivity, sounds sensitivity, ears ringing, dizziness, vertigo, locations in the frontal area occ in the right occipital. Feels stabbing, throbbing. It can last up to 2 days. Unilateral migraines,  pulsating and pounding and throbbing, She gets nausea no vomiting, She has light sensitivity, sounds sensitivity, ears ringing, dizziness, vertigo, locations in the frontal area occ in the right occipital. Feels stabbing, throbbing. It can last up to 2 days and be severe. She gets nausea no vomiting. She has untreated sleep apnea. She takes excedrin every  day. She has positional headaches and also vision blurriness and vision changes. Untreated sleep apnea with severe sleep apnea.  - requested records from previous sleep doctor, pending  - reviewed sleep report and data, AHI in 2011 to a max of 121 in REM sleep, was titrated on 10cm in 2011  Interval history 12/01/2015:  Patient is doing well. One migraine a month. Tolerating Depakote well. No more events of alteration of consciousness. The maxalt works with the zofran, she has not tried the [place]. It takes several hours for the maxalt to work, she has to take it twice. Suggest taking the maxalt with the zofran and cambia. She had hysterectomy.   UEA:VWUJWJXB H [name] a 38 y.o.femalehere as a referral from Dr. Joana Reamer Migraines and seizures in the setting of stress and anxiety.migraines Started 10 years ago. She has the migraines twice a month. She has light sensitivity, sounds sensitivity, ears ringing, dizziness, vertigo, locations in the frontal area occ in the right occipital. Feels stabbing, throbbing. It can last up to 2 days. She gets nausea no vomiting. She has diarrhea sometimes. She is on Depakote but just recently restarted, she was successfully treated with Depakote in the past but was feeling better and stopped it. She has had a hysterectomy. She has been on Depakote off and on for 20 years. Also on Dilantin. She has a history of seizures as well. The seizures always happen when she gets stressed or with anger or anxiety , she passes out and jerks and shakes and wakes up confused. Workup has been negative. EEGs have been normal. Last time she had a seizure was on June 6th. She was seeing a neurologist. She was on Topiramate 200mg  twice a day. She was seeing a doctor in [place] and the Topiramate was too high. She was angry earlier this month, she was frstrated, she "passed out". She says she got dizzy, vertigo, she came to with a cloth over her head and something cold over her  neck. It lasted one minute. Husband says she will jerk than shake back and forth. Only when she stresses. This was the first one in a year. The seizures are always with stress or emotion that triggers it. She was on Depakote twice a day for many years. No other focal neurologic deficits or complaints.  Reviewed notes, labs and imaging from outside physicians, which showed: Patient was seen at [facility] for evaluation of vertigo, symptoms started in July 2012 with elevated Dilantin levels in the emergency room. Stress exacerbate her symptoms. She has participated and vestibular rehabilitation. The rehabilitation and improved her dizziness been no change in nausea. She reported associated seizures and migraines and associated sensitivity to light. On examination tests of ocular motor function revealed normal saccades, pursuit and optokinetic eye movements. There was no spontaneous nystagmus or gaze evoked nystagmus, rotary chair testing revealed normal vestibular ocular reflex gain, phase and symmetry at all frequencies testing indicating affective vestibular control. Dix-Hallpike's tests were negative for posterior semicircular canal BPPV, roll tests were negative for horizontal semicircular canal BPPV, caloric stimulation showed symmetric vestibular responses. Diagnosis was normal peripheral and central vestibular functions.   REVIEW OF SYSTEMS:  Out of a complete 14 system review of symptoms, the patient complains only of the following symptoms, migraines, dizziness, nausea, syncope and all other reviewed systems are negative.  ALLERGIES: Allergies  Allergen Reactions  . Keflex [Cephalexin]     Reflux  . Latex Other (See Comments)    Rash  . Ciprofloxacin Rash    HOME MEDICATIONS: Outpatient Medications Prior to Visit  Medication Sig Dispense Refill  . Bismuth Subsalicylate (PEPTO-BISMOL PO) Take 1 tablet by mouth as needed.    . Estradiol (ESTRACE PO) Take 1.25 mg by mouth daily.    .  Galcanezumab-gnlm (EMGALITY) 120 MG/ML SOAJ Inject 120 mg into the skin every 30 (thirty) days. 1 pen 11  . HYDROcodone-homatropine (HYCODAN) 5-1.5 MG/5ML syrup Take 5 mLs by mouth every 8 (eight) hours as needed for cough. 120 mL 0  . ondansetron (ZOFRAN) 4 MG tablet Take 1 tablet (4 mg total) by mouth every 8 (eight) hours as needed. 20 tablet 0  . ondansetron (ZOFRAN-ODT) 4 MG disintegrating tablet Take 1 tablet (4 mg total) by mouth every 8 (eight) hours as needed for nausea or vomiting. 20 tablet 11  . predniSONE (DELTASONE) 50 MG tablet Take 1 tab daily for 5 days. 5 tablet 0  . rizatriptan (MAXALT) 10 MG tablet Take 1 tablet (10 mg total) by mouth as needed for migraine. May repeat in 2 hours if needed. Max 2 doses in 24 hours. 10 tablet 11  . tiZANidine (ZANAFLEX) 4 MG tablet Take 1 tablet (4 mg total) by mouth every 6 (six) hours as needed for muscle spasms. 90 tablet 6   No facility-administered medications prior to visit.     PAST MEDICAL HISTORY: Past Medical History:  Diagnosis Date  . Anxiety   . Asthma   . Breast cyst, left 2019  . Depression   . Fibromyalgia   . History of stomach ulcers   . Lumbar herniated disc   . Migraine   . Seizures ([place])     PAST SURGICAL HISTORY: Past Surgical History:  Procedure Laterality Date  . APPENDECTOMY  1996  . CHOLECYSTECTOMY  2003  . LAPAROSCOPIC OOPHERECTOMY Right 2006  . LAPAROSCOPIC OOPHERECTOMY Left 2007  . pevlic surgery  [id number]   Plevic mass  . TUBAL LIGATION  2003  . VAGINAL HYSTERECTOMY  2005    FAMILY HISTORY: Family History  Problem Relation Age of Onset  . Heart attack Father        nov 2018  . Diabetes Other   . Cancer Other   . Heart disease Other   . Stroke Other   . Anemia Other   . Fibromyalgia Other   . Seizures Neg Hx   . Migraines Neg Hx     SOCIAL HISTORY: Social History   Socioeconomic History  . Marital status: Married    Spouse name: Aaron Edelman  . Number of children: 3  . Years of  education: 30  . Highest education level: Not on file  Occupational History  . Occupation: [employer]    Comment: left dec 2018  . 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 Financial resource strain: Not on file  . Food insecurity:    Worry: Not on file    Inability: Not on file  . Transportation needs:    Medical: Not on file    Non-medical: Not on file  Tobacco Use  . Smoking  status: Former Smoker    Packs/day: 0.50    Last attempt to quit: 2018    Years since quitting: 2.1  . Smokeless tobacco: Never Used  . Tobacco comment: black & mild  Substance and Sexual Activity  . Alcohol use: Not Currently    Comment: was social, no longer drinking   . Drug use: Not Currently    Comment: Marijuana- ocassionally (07/21/15)  . Sexual activity: Not on file  Lifestyle  . Physical activity:    Days per week: Not on file    Minutes per session: Not on file  . Stress: Not on file  Relationships  . Social connections:    Talks on phone: Not on file    Gets together: Not on file    Attends religious service: Not on file    Active member of club or organization: Not on file    Attends meetings of clubs or organizations: Not on file    Relationship status: Not on file  . Intimate partner violence:    Fear of current or ex partner: Not on file    Emotionally abused: Not on file    Physically abused: Not on file    Forced sexual activity: Not on file  Other Topics Concern  . Not on file  Social History Narrative   Lives: with spouse and 2 of her children    Caffeine use: 1 cup of tea daily   Right handed      PHYSICAL EXAM  Vitals:   04/08/18 1330  BP: 125/78  Pulse: 68  Weight: 220 lb 6.4 oz (100 kg)  Height: 5\' 4"  (1.626 m)   Body mass index is 37.83 kg/m.  Generalized: Well developed, in no acute distress  Cardiology: normal rate and rhythm, no murmur noted Neurological examination  Mentation:  Alert oriented to time, place, history taking. Follows all commands speech and language fluent Cranial nerve II-XII: Pupils were equal round reactive to light. Extraocular movements were full, visual field were full on confrontational test. Facial sensation and strength were normal. Uvula tongue midline. Head turning and shoulder shrug  were normal and symmetric. Motor: The motor testing reveals 5 over 5 strength of all 4 extremities. Good symmetric motor tone is noted throughout.  Sensory: Sensory testing is intact to soft touch on all 4 extremities. No evidence of extinction is noted.  Coordination: Cerebellar testing reveals good finger-nose-finger and heel-to-shin bilaterally.  Gait and station: Gait is normal. Tandem gait is normal. Romberg is negative. No drift is seen.  Reflexes: Deep tendon reflexes are symmetric and normal bilaterally.   DIAGNOSTIC DATA (LABS, IMAGING, TESTING) - I reviewed patient records, labs, notes, testing and imaging myself where available.  No flowsheet data found.   Lab Results  Component Value Date   WBC 4.8 09/19/2017   HGB 12.7 09/19/2017   HCT 39.1 09/19/2017   MCV 82 09/19/2017   PLT 217 09/19/2017      Component Value Date/Time   NA 143 09/19/2017 0943   K 4.4 09/19/2017 0943   CL 105 09/19/2017 0943   CO2 24 09/19/2017 0943   GLUCOSE 80 09/19/2017 0943   BUN 10 09/19/2017 0943   CREATININE 0.66 09/19/2017 0943   CALCIUM 9.2 09/19/2017 0943   PROT 7.2 09/19/2017 0943   ALBUMIN 4.3 09/19/2017 0943   AST 17 09/19/2017 0943   ALT 15 09/19/2017 0943   ALKPHOS 81 09/19/2017 0943   BILITOT 0.4 09/19/2017 0943   GFRNONAA 113 09/19/2017 0943  GFRAA 131 09/19/2017 0943   No results found for: CHOL, HDL, LDLCALC, LDLDIRECT, TRIG, CHOLHDL No results found for: HGBA1C No results found for: VITAMINB12 No results found for: TSH     ASSESSMENT AND PLAN 38 y.o. year old female  has a past medical history of Anxiety, Asthma, Breast cyst, left  (2019), Depression, Fibromyalgia, History of stomach ulcers, Lumbar herniated disc, Migraine, and Seizures ([place]). here with     ICD-10-CM   1. Chronic migraine without aura, with intractable migraine, so stated, with status migrainosus G43.711     She has had an ongoing migraine for over a week. She has taken multiple medication combinations with no success. Orthostatic vitals normal with patient asymptomatic. Ubrelvy today in office did not help. Will infuse with 1gram decadron, 125mg  solumedrol, 10mg  compazine and 30mg  Toradol. She reports improvement in headache following infusion. Pain before rated 8-9/10 and afterwards rated 5-6/10. I am encouraged that there is most likely no acute intracranial pathology with reported improvement. She was instructed to go home and rest. Should headache worsen, she was instructed to go to the ER. She will continue Emgality, rizatriptan, zofran and tizanidine as prescribed.    No orders of the defined types were placed in this encounter.    Meds ordered this encounter  Medications  . Ubrogepant (UBRELVY) 100 MG TABS    Sig: Take 100 mg by mouth daily as needed.    Dispense:  1 tablet    Refill:  0    Order Specific Question:   Supervising Provider    Answer:   Melvenia Beam [[id number]]    Order Specific Question:   Lot Number?    Answer:   [id number]    Order Specific Question:   Expiration Date?    Answer:   10/31/2019    Order Specific Question:   Quantity    Answer:   1        Ilya Ess, FNP-C 04/08/2018, 3:17 PM [facility] [address] [place], [place] [address] [phone]

## 2018-04-08 NOTE — Patient Instructions (Addendum)
Continue Emgality, rizatriptan, zofran and tizanidine as prescribed  If migraine worsens go to the ER  Rest tonight at home  Follow up in 6 months, sooner if needed.    Migraine Headache  A migraine headache is a very strong throbbing pain on one side or both sides of your head. Migraines can also cause other symptoms. Talk with your doctor about what things may bring on (trigger) your migraine headaches. Follow these instructions at home: Medicines  Take over-the-counter and prescription medicines only as told by your doctor.  Do not drive or use heavy machinery while taking prescription pain medicine.  To prevent or treat constipation while you are taking prescription pain medicine, your doctor may recommend that you: ? Drink enough fluid to keep your pee (urine) clear or pale yellow. ? Take over-the-counter or prescription medicines. ? Eat foods that are high in fiber. These include fresh fruits and vegetables, whole grains, and beans. ? Limit foods that are high in fat and processed sugars. These include fried and sweet foods. Lifestyle  Avoid alcohol.  Do not use any products that contain nicotine or tobacco, such as cigarettes and e-cigarettes. If you need help quitting, ask your doctor.  Get at least 8 hours of sleep every night.  Limit your stress. General instructions   Keep a journal to find out what may bring on your migraines. For example, write down: ? What you eat and drink. ? How much sleep you get. ? Any change in what you eat or drink. ? Any change in your medicines.  If you have a migraine: ? Avoid things that make your symptoms worse, such as bright lights. ? It may help to lie down in a dark, quiet room. ? Do not drive or use heavy machinery. ? Ask your doctor what activities are safe for you.  Keep all follow-up visits as told by your doctor. This is important. Contact a doctor if:  You get a migraine that is different or worse than your usual  migraines. Get help right away if:  Your migraine gets very bad.  You have a fever.  You have a stiff neck.  You have trouble seeing.  Your muscles feel weak or like you cannot control them.  You start to lose your balance a lot.  You start to have trouble walking.  You pass out (faint). This information is not intended to replace advice given to you by your health care provider. Make sure you discuss any questions you have with your health care provider. Document Released: 10/26/2007 Document Revised: 10/10/2017 Document Reviewed: 07/05/2015 Elsevier Interactive Patient Education  2019 [employer].

## 2018-04-10 ENCOUNTER — Telehealth: Payer: Self-pay | Admitting: *Deleted

## 2018-04-10 NOTE — Telephone Encounter (Signed)
Received FMLA/STD paperwork from [employer], will complete as much as possible; then will forward to provider/SLS 03/11

## 2018-04-11 ENCOUNTER — Other Ambulatory Visit: Payer: Self-pay | Admitting: *Deleted

## 2018-04-11 MED ORDER — UBROGEPANT 50 MG PO TABS: 50.0000 mg | ORAL_TABLET | ORAL | 1 refills | Status: DC | PRN

## 2018-04-12 ENCOUNTER — Other Ambulatory Visit: Payer: Self-pay | Admitting: *Deleted

## 2018-04-12 ENCOUNTER — Other Ambulatory Visit: Payer: Self-pay | Admitting: Family Medicine

## 2018-04-12 DIAGNOSIS — G44221 Chronic tension-type headache, intractable: Secondary | ICD-10-CM

## 2018-04-12 MED ORDER — UBROGEPANT 50 MG PO TABS: 50.0000 mg | ORAL_TABLET | ORAL | 1 refills | Status: DC | PRN

## 2018-04-12 NOTE — Telephone Encounter (Signed)
Forwarded to provider for completion/SLS 03/13

## 2018-04-12 NOTE — Telephone Encounter (Signed)
Pt no longer using [facility]. I called them & LVM and canceled the prescription for Ubrelvy. Left office number in message for call back if any questions. Louisa Second to [place]  on [employer] per pt request.

## 2018-04-12 NOTE — Telephone Encounter (Signed)
LMOM with contact name and number for return call RE: all dates out of work and if for Migraine and/or Sinusitis; informed to leave a message [with [facility]] with this information and we will get her paperwork taken care of/SLS 03/13

## 2018-04-15 ENCOUNTER — Telehealth: Payer: Self-pay | Admitting: Family Medicine

## 2018-04-15 NOTE — Telephone Encounter (Signed)
Patient returned my call she is scheduled for 04/24/18 at [facility].

## 2018-04-15 NOTE — Telephone Encounter (Signed)
lvm for pt to call back about scheduling [facility] Auth: [id number] (exp. 04/15/18 to 05/30/18)

## 2018-04-16 ENCOUNTER — Encounter: Payer: Self-pay | Admitting: Family Medicine

## 2018-04-16 ENCOUNTER — Telehealth: Payer: Self-pay

## 2018-04-16 NOTE — Telephone Encounter (Signed)
Paperwork was faxed with confirmation on Mon, 04/15/18//SLS

## 2018-04-16 NOTE — Telephone Encounter (Signed)
Pending approval for Ubrelvy 50 mg tablets Key: [id number] Rx #: [id number]  I will update once a decision has been made.

## 2018-04-17 NOTE — Telephone Encounter (Signed)
Marie Rios was denied by the patient's insurance. Patient will be notified that the copay card that she received will override a PA needing to be done.

## 2018-04-22 NOTE — Telephone Encounter (Signed)
I left a voicemail informing the patient that due to our office protocol they have decided not to have MRI's for the time being. I stated that I will send the order to GI and if she has not heard from them in the next couple days to give them a call at [phone].

## 2018-04-24 ENCOUNTER — Other Ambulatory Visit: Payer: 59

## 2018-04-24 NOTE — Telephone Encounter (Signed)
Received paperwork from cover my meds asking for a provider appeal for the [place] PA. Paperwork has been signed and placed on provider's desk to sign.

## 2018-04-29 NOTE — Telephone Encounter (Signed)
Appeal for Marie Rios has been faxed to [phone]. Confirmation fax has been received.

## 2018-05-06 ENCOUNTER — Ambulatory Visit (INDEPENDENT_AMBULATORY_CARE_PROVIDER_SITE_OTHER): Payer: 59 | Admitting: Neurology

## 2018-05-06 ENCOUNTER — Other Ambulatory Visit: Payer: Self-pay

## 2018-05-06 DIAGNOSIS — R519 Headache, unspecified: Secondary | ICD-10-CM

## 2018-05-06 DIAGNOSIS — R51 Headache: Secondary | ICD-10-CM

## 2018-05-06 DIAGNOSIS — G4733 Obstructive sleep apnea (adult) (pediatric): Secondary | ICD-10-CM | POA: Diagnosis not present

## 2018-05-06 DIAGNOSIS — E669 Obesity, unspecified: Secondary | ICD-10-CM

## 2018-05-06 DIAGNOSIS — Z820 Family history of epilepsy and other diseases of the nervous system: Secondary | ICD-10-CM

## 2018-05-06 DIAGNOSIS — Z789 Other specified health status: Secondary | ICD-10-CM

## 2018-05-06 DIAGNOSIS — R351 Nocturia: Secondary | ICD-10-CM

## 2018-05-06 NOTE — Telephone Encounter (Signed)
LMVM for pt that received notice from [place] asking about if you have tried a 2nd triptan (other then rizatriptan).  Please call back and let me know.  (relating to appeal for Ubrelvy).  Thanks.

## 2018-05-07 ENCOUNTER — Encounter: Payer: Self-pay | Admitting: Neurology

## 2018-05-07 NOTE — Telephone Encounter (Signed)
I called and spoke to pt at work #.  She stated that she has tried imitrex from her pcp prior to seeing [place] and did not help so was switched to rizatriptan.  Will fax [phone], fax confirmation received.

## 2018-05-16 ENCOUNTER — Encounter: Payer: Self-pay | Admitting: Neurology

## 2018-05-27 ENCOUNTER — Telehealth: Payer: Self-pay

## 2018-05-27 NOTE — Telephone Encounter (Signed)
I called pt to discuss her sleep study results. No answer, left a message asking her to call me back. 

## 2018-05-27 NOTE — Addendum Note (Signed)
Addended by: Star Age on: 05/27/2018 08:12 AM   Modules accepted: Orders

## 2018-05-27 NOTE — Progress Notes (Signed)
Patient referred by Dr. Jaynee Eagles, seen by me on 03/25/18, HST on 05/11/18.    Please call and notify the patient that the recent home sleep test showed obstructive sleep apnea in the severe range (by number of evens, 33/hour, O2 nadir 91%). I recommend treatment in the form of autoPAP at home, which means, that we don't have to bring her in for a sleep study with CPAP, but will let start using an autoPAP machine at home, through a DME company (of patient's choice, or as per insurance requirement, as per in [employer], if there are such restrictions, depending on insurance carrier). The DME representative will educate the patient on how to use the machine, how to put the mask on, etc. I have placed an order in the chart. Please send referral, talk to patient, send report to referring MD. We will need a FU in sleep clinic for 10 weeks post-PAP set up, please arrange that with me or one of our NPs.  Also, please remind patient about the importance of compliance with PAP usage, even if she wants to just "try" it for 3 months or so; this is an Designer, industrial/product, but good compliance also helps [place] track improvements in patient's sleep related complaints and objective improvements, such as BP and weight for example or nocturia or headaches, etc. For concerns and questions about how to clean the PAP machine and the supplies and how frequently to change the hose, mask and filters, etc., patient can call the DME company, for more information, education and troubleshooting. Especially in the current situation, I recommend, patients be extra mindful about hand hygiene, handling the PAP equipment only with clean hands, wipe the mask daily, keep little one and four-legged companions (and any other pets for that matter) away from the machine and mask at all times.    Thanks,   Star Age, MD, PhD [facility])

## 2018-05-27 NOTE — Procedures (Signed)
Patient Information     First Name: Marie Last Name: Rios ID: [id number]  Birth Date: 1980/05/20 Age: 38 Gender: Female  Referring Doctor: Sarina Ill, MD BMI: 38.0 (W=222 lb, H=5' 4'')  Reading Doctor: Neck Circ.:  Star Age, MD  14 Epworth:  11/24   Sleep Study Information    Study Date: May 11, 2018 S/H/A Version: [id number] / 4.1.1531 / 73              History:  38 year old woman with a history of migraine headaches, history of stomach ulcer, fibromyalgia, depression, anxiety, asthma, previous diagnosis of possible seizures, and obesity, who reports snoring and excessive daytime somnolence as well as occasional morning headaches.  Diagnosis: OSA Recommendations: This home sleep test demonstrates severe obstructive sleep apnea (by number of events) with a total AHI of 33/hour and O2 nadir of 91%. Treatment with positive airway pressure is reasonable, even in the absence of desaturations below 90%, as treatment may result in better sleep consolidation and reduction in headaches. Under the current circumstances (i.e. the COVID-19 pandemic), in order to ensure ongoing good care and for the safety of the patient, she will be advised to proceed with an autoPAP titration/trial at home. A proper titration study with CPAP may be helpful or needed down the road, when considered safe. Please note, that untreated obstructive sleep apnea may carry additional perioperative morbidity. Patients with significant obstructive sleep apnea should receive perioperative PAP therapy and the surgeons and particularly the anesthesiologist should be informed of the diagnosis and the severity of the sleep disordered breathing. Patient will be reminded regarding compliance with her PAP machine and to be mindful of cleanliness with the equipment and timely with supply changes (i.e. changing filter, mask, hose, humidifier chamber on an ongoing basis as recommended, and cleaning parts that touch the face and nose daily,  etc). The patient should be cautioned not to drive, work at heights, or operate dangerous or heavy equipment when tired or sleepy. Review and reiteration of good sleep hygiene measures should be pursued with any patient. Other causes of the patient's symptoms, including circadian rhythm disturbances, an underlying mood disorder, medication effect and/or an underlying medical problem cannot be ruled out based on this test. Clinical correlation is recommended. The patient and her referring provider will be notified of the test results. The patient will be seen in follow up in sleep clinic at [facility], either for a face-to-face or virtual visit, whichever feasible and recommended at the time.  I certify that I have reviewed the raw data recording prior to the issuance of this report in accordance with the standards of the American Academy of Sleep Medicine (AASM).  Star Age, MD, PhD [facility]) Diplomat, ABPN (Neurology and Sleep)                  Sleep Summary    Oxygen Saturation Statistics     Start Study Time: End Study Time: Total Recording Time:  11:57:50 PM 7:13:18 AM 7 h, 14min  Total Sleep Time % REM of Sleep Time:  6 h, 16 min  28.5%    Mean: 95              Minimum: 91                         Maximum: 100  Mean of Desaturations Nadirs (%): 93     Oxygen Desaturation %: 4-9 10-20 >20 Total  Events Number Total  68 100.0  0 0.0  0 0.0  68 100.0  Oxygen Saturation: <90 <=88 <85 <80 <70  Duration (minutes): Sleep % 0.0 0.0 0.0 0.0 0.0 0.0 0.0 0.0 0.0 0.0     Respiratory Indices      Total Events REM NREM All Night  pRDI:  240  pAHI:  205 ODI:  68  pAHIc:  29  % CSR: 0.0 17.5 15.8 4.0 3.4 47.1 39.9 13.7 5.2 38.6 33.0 10.9 4.7       Pulse Rate Statistics during Sleep (BPM)      Mean: 65 Minimum: N/A  Maximum: 99      Indices are calculated using technically valid sleep time of  6 hrs, 12 min. pRDI/pAHI are  calculated using oxi desaturations ? 3%  Body Position Statistics  Position Supine Prone Right Left Non-Supine  Sleep (min) 63.0 202.0 27.5 84.5 314.0  Sleep % 16.7 53.6 7.3 22.4 83.3  pRDI 31.8 36.7 48.9 45.0 40.0  pAHI 31.8 29.8 35.6 40.7 33.2  ODI 10.6 8.7 22.2 12.9 11.0     Snoring Statistics Snoring Level (dB) >40 >50 >60 >70 >80 >Threshold (45)  Sleep (min) 255.2 164.8 51.3 0.0 0.0 212.2  Sleep % 67.7 43.7 13.6 0.0 0.0 56.3    Mean: 49 dB Sleep Stages Chart                                                  pAHI=33.0                                                               Mild              Moderate                    Severe                                                    5              15                    30  * Reference values are according to AASM guidelines

## 2018-05-27 NOTE — Telephone Encounter (Signed)
-----   Message from Star Age, MD sent at 05/27/2018  8:12 AM EDT ----- Patient referred by Dr. Jaynee Eagles, seen by me on 03/25/18, HST on 05/11/18.    Please call and notify the patient that the recent home sleep test showed obstructive sleep apnea in the severe range (by number of evens, 33/hour, O2 nadir 91%). I recommend treatment in the form of autoPAP at home, which means, that we don't have to bring her in for a sleep study with CPAP, but will let start using an autoPAP machine at home, through a DME company (of patient's choice, or as per insurance requirement, as per in [employer], if there are such restrictions, depending on insurance carrier). The DME representative will educate the patient on how to use the machine, how to put the mask on, etc. I have placed an order in the chart. Please send referral, talk to patient, send report to referring MD. We will need a FU in sleep clinic for 10 weeks post-PAP set up, please arrange that with me or one of our NPs.  Also, please remind patient about the importance of compliance with PAP usage, even if she wants to just "try" it for 3 months or so; this is an Designer, industrial/product, but good compliance also helps [place] track improvements in patient's sleep related complaints and objective improvements, such as BP and weight for example or nocturia or headaches, etc. For concerns and questions about how to clean the PAP machine and the supplies and how frequently to change the hose, mask and filters, etc., patient can call the DME company, for more information, education and troubleshooting. Especially in the current situation, I recommend, patients be extra mindful about hand hygiene, handling the PAP equipment only with clean hands, wipe the mask daily, keep little one and four-legged companions (and any other pets for that matter) away from the machine and mask at all times.     Thanks,   Star Age, MD, PhD [facility])

## 2018-05-27 NOTE — Telephone Encounter (Signed)
I called pt. I advised pt that Dr. Rexene Alberts reviewed their sleep study results and found that pt has severe osa. Dr. Rexene Alberts recommends that pt start an auto pap at . I reviewed PAP compliance expectations with the pt. Pt is agreeable to starting an auto-PAP. I advised pt that an order will be sent to a DME, [facility], and Huey Romans will call the pt within about one week after they file with the pt's insurance. Huey Romans will show the pt how to use the machine, fit for masks, and troubleshoot the auto-PAP if needed. A follow up appt was made for insurance purposes with Janett Billow, NP on 08/14/18 at 3:45pm. Pt verbalized understanding to arrive 15 minutes early and bring their auto-PAP. A letter with all of this information in it will be mailed to the pt as a reminder. I verified with the pt that the address we have on file is correct. Pt verbalized understanding of results. Pt had no questions at this time but was encouraged to call back if questions arise. I have sent the order to [facility] and have received confirmation that they have received the order.

## 2018-05-29 ENCOUNTER — Encounter: Payer: Self-pay | Admitting: Family Medicine

## 2018-05-29 ENCOUNTER — Telehealth: Payer: Self-pay | Admitting: Family Medicine

## 2018-05-29 NOTE — Telephone Encounter (Signed)
[facility] has called to inform they received notification from the insurance company that DE:[id number] (UBRELVY) 50 MG TABS pt will not be covered for this and something else needs to be called in for her

## 2018-05-29 NOTE — Telephone Encounter (Signed)
I emailed pt back on mychart.

## 2018-05-30 MED ORDER — UBROGEPANT 50 MG PO TABS: 50.0000 mg | ORAL_TABLET | ORAL | 1 refills | Status: DC | PRN

## 2018-05-30 NOTE — Addendum Note (Signed)
Addended by: Brandon Melnick on: 05/30/2018 08:03 AM   Modules accepted: Orders

## 2018-05-30 NOTE — Telephone Encounter (Signed)
I have sent new prescription to [facility] along with faxing copy of ubrelvy savings card.  Fax confirmation received.  Pt made aware via mychart.

## 2018-06-03 NOTE — Telephone Encounter (Signed)
I scanned copy of ubrelvy card via her Kimberly email.  Prescription to [facility] as well.  Her insurance is ID [id number], BIN [id number], PCN [id number], GRP [id number].

## 2018-06-05 DIAGNOSIS — G4733 Obstructive sleep apnea (adult) (pediatric): Secondary | ICD-10-CM | POA: Diagnosis not present

## 2018-06-17 ENCOUNTER — Encounter: Payer: Self-pay | Admitting: Neurology

## 2018-06-17 ENCOUNTER — Other Ambulatory Visit: Payer: 59

## 2018-07-19 ENCOUNTER — Encounter: Payer: Self-pay | Admitting: Family Medicine

## 2018-07-26 ENCOUNTER — Other Ambulatory Visit: Payer: Self-pay

## 2018-07-26 ENCOUNTER — Encounter: Payer: Self-pay | Admitting: Family Medicine

## 2018-07-26 ENCOUNTER — Ambulatory Visit (INDEPENDENT_AMBULATORY_CARE_PROVIDER_SITE_OTHER): Payer: 59 | Admitting: Family Medicine

## 2018-07-26 DIAGNOSIS — J069 Acute upper respiratory infection, unspecified: Secondary | ICD-10-CM | POA: Diagnosis not present

## 2018-07-26 MED ORDER — PROMETHAZINE-DM 6.25-15 MG/5ML PO SYRP: 5.0000 mL | ORAL_SOLUTION | Freq: Four times a day (QID) | ORAL | 0 refills | Status: DC | PRN

## 2018-07-26 MED ORDER — AZELASTINE HCL 0.1 % NA SOLN: 2.0000 | Freq: Two times a day (BID) | NASAL | 0 refills | Status: DC

## 2018-07-26 MED ORDER — DOXYCYCLINE HYCLATE 100 MG PO TABS: 100.0000 mg | ORAL_TABLET | Freq: Two times a day (BID) | ORAL | 0 refills | Status: AC

## 2018-07-26 NOTE — Progress Notes (Signed)
CC: URI complaints  Marie Rios here for URI complaints. Due to COVID-19 pandemic, we are interacting via web portal for an electronic face-to-face visit. I verified patient's ID using 2 identifiers. Patient agreed to proceed with visit via this method. Patient is at home, I am at office. Patient and I are present for visit.   Duration: 8 days  Associated symptoms: Fever (101 F), sinus congestion, sinus pain, ear pain, shortness of breath, myalgia, upset stomach, and cough Denies: rhinorrhea, itchy watery eyes, ear drainage, sore throat and wheezing Treatment to date: Delsym, Zyrtec Sick contacts: No  No recent travel.   ROS:  HEENT: As noted in HPI Lungs: +cough  Past Medical History:  Diagnosis Date  . Anxiety   . Asthma   . Breast cyst, left 2019  . Depression   . Fibromyalgia   . History of stomach ulcers   . Lumbar herniated disc   . Migraine   . Seizures ([place])    Exam No conversational dyspnea Age appropriate judgment and insight Nml affect and mood  Upper respiratory tract infection, unspecified type - Plan: promethazine-dextromethorphan (PROMETHAZINE-DM) 6.25-15 MG/5ML syrup, azelastine (ASTELIN) 0.1 % nasal spray, doxycycline (VIBRA-TABS) 100 MG tablet, supportive care; abx in 2 d to cover for bact bronchitis/sinusitis if no better. Let me know in 4-5 d if no improvement, will test again for covid-19. Will rec she proceed as if she has it wearing masks, minimizing outings, hand hygiene.  Continue to push fluids, practice good hand hygiene, cover mouth when coughing. F/u prn. Excessive sob or inability to keep up with fluid losses, go to ER.  Pt voiced understanding and agreement to the plan.  [place], DO 07/26/18 7:16 AM

## 2018-07-30 ENCOUNTER — Encounter: Payer: Self-pay | Admitting: Family Medicine

## 2018-07-31 ENCOUNTER — Other Ambulatory Visit: Payer: Self-pay | Admitting: Family Medicine

## 2018-07-31 MED ORDER — FLUCONAZOLE 150 MG PO TABS: 150.0000 mg | ORAL_TABLET | Freq: Once | ORAL | 0 refills | Status: AC

## 2018-08-14 ENCOUNTER — Encounter: Payer: Self-pay | Admitting: Adult Health

## 2018-08-14 ENCOUNTER — Ambulatory Visit: Payer: 59 | Admitting: Adult Health

## 2018-08-14 ENCOUNTER — Other Ambulatory Visit: Payer: Self-pay

## 2018-08-14 VITALS — BP 122/65 | HR 83 | Temp 97.7°F | Ht 64.0 in | Wt 224.0 lb

## 2018-08-14 DIAGNOSIS — R519 Headache, unspecified: Secondary | ICD-10-CM

## 2018-08-14 DIAGNOSIS — R51 Headache: Secondary | ICD-10-CM

## 2018-08-14 DIAGNOSIS — R42 Dizziness and giddiness: Secondary | ICD-10-CM

## 2018-08-14 DIAGNOSIS — R5382 Chronic fatigue, unspecified: Secondary | ICD-10-CM

## 2018-08-14 DIAGNOSIS — G4733 Obstructive sleep apnea (adult) (pediatric): Secondary | ICD-10-CM | POA: Diagnosis not present

## 2018-08-14 NOTE — Patient Instructions (Signed)
Your Plan:  Continue current settings of CPAP machine as your apnea is being adequately controlled.  In regards to your ongoing fatigue and morning headaches, it can take up to 6 months to feel full benefit of CPAP machine.  If you continue to have ongoing excessive daytime fatigue after that time, discussion regarding further evaluation/treatment options can be discussed Please notify office if you are interested in undergoing vestibular rehab therapy for possible benign paroxysmal positional vertigo.  During the interval time, you can perform these exercises on your own at home 1. Sit on the edge of the bed and turn your head 45 degrees to one side. 2. Quickly lie down on your opposite side (to the left if you turned your head to the right, and vice versa) so that the back of your head behind your ear touches the bed. 3. Hold this position for about 30 seconds or until the dizziness symptoms stop. 4. Return to the sitting position. 5. Repeat steps 1-4 on the other side. You should repeat these steps three times or until you have completed six repetitions on each side. Unless your specialist or physiotherapist has recommended otherwise, you should do the exercises two to three times a day for two weeks.   Follow-up with Amy, NP as scheduled regarding migraine management      Thank you for coming to see [place] at [facility]. I hope we have been able to provide you high quality care today.  You may receive a patient satisfaction survey over the next few weeks. We would appreciate your feedback and comments so that we may continue to improve ourselves and the health of our patients.

## 2018-08-14 NOTE — Progress Notes (Addendum)
PATIENT: Marie Rios DOB: [birth year]  REASON FOR VISIT: follow up HISTORY FROM: patient  Chief Complaint  Patient presents with  . Follow-up    Initial auto pap. Husband present. Rm 9. Patient mentioned that she is getting use to using at night.      HISTORY OF PRESENT ILLNESS: HISTORY   Kemberly Taves is a 38 year old female who is being seen today for initial CPAP compliance visit.  Compliance report from 07/14/2018 -08/12/2018 showed 30 out of 30 usage days and 30 days greater than 4 hours for 100% compliance.  Average usage 6 hours and 16 minutes with residual AHI of 1.3.  With a 95th percentile 7.2.  Pressure in the 95th percentile 8.9 on min pressure 5 and max pressure 11 with a EPR level 3.  She initiated CPAP approximately 2 months ago.  She continues to experience excessive daytime fatigue and morning headaches despite excellent compliance and optimal residual AHI.  FSS 50 and ESS 10.  She does endorse having greater difficulty initiating sleep due to using mask but this has been slowly improving.  She also endorses occasional vertigo sensation which will come in "waves" where she will experience room spinning which can last for few seconds with position change or head movement but then episodes will be more frequent over the next 2 to 3 days.  She will then typically experience a migraine after that time.  She has participated in vestibular rehab in the past and is unsure if this provided benefit.  She continues to follow with Dr. Jaynee Eagles and Debbora Presto, NP for migraine management.     REVIEW OF SYSTEMS: Out of a complete 14 system review of symptoms, the patient complains only of the following symptoms, and all other reviewed systems are negative. Dizziness Headache Daytime fatigue   ALLERGIES: Allergies  Allergen Reactions  . Keflex [Cephalexin]     Reflux  . Latex Other (See Comments)    Rash  . Ciprofloxacin Rash    HOME MEDICATIONS: Outpatient Medications  Prior to Visit  Medication Sig Dispense Refill  . azelastine (ASTELIN) 0.1 % nasal spray Place 2 sprays into both nostrils 2 (two) times daily. Use in each nostril as directed 30 mL 0  . Bismuth Subsalicylate (PEPTO-BISMOL PO) Take 1 tablet by mouth as needed.    . Estradiol (ESTRACE PO) Take 1.25 mg by mouth daily.    . Galcanezumab-gnlm (EMGALITY) 120 MG/ML SOAJ Inject 120 mg into the skin every 30 (thirty) days. 1 pen 11  . HYDROcodone-homatropine (HYCODAN) 5-1.5 MG/5ML syrup Take 5 mLs by mouth every 8 (eight) hours as needed for cough. 120 mL 0  . ondansetron (ZOFRAN) 4 MG tablet Take 1 tablet (4 mg total) by mouth every 8 (eight) hours as needed. 20 tablet 0  . ondansetron (ZOFRAN-ODT) 4 MG disintegrating tablet Take 1 tablet (4 mg total) by mouth every 8 (eight) hours as needed for nausea or vomiting. 20 tablet 11  . predniSONE (DELTASONE) 50 MG tablet Take 1 tab daily for 5 days. 5 tablet 0  . promethazine-dextromethorphan (PROMETHAZINE-DM) 6.25-15 MG/5ML syrup Take 5 mLs by mouth 4 (four) times daily as needed. 118 mL 0  . rizatriptan (MAXALT) 10 MG tablet Take 1 tablet (10 mg total) by mouth as needed for migraine. May repeat in 2 hours if needed. Max 2 doses in 24 hours. 10 tablet 11  . tiZANidine (ZANAFLEX) 4 MG tablet Take 1 tablet (4 mg total) by mouth every 6 (six) hours  as needed for muscle spasms. 90 tablet 6  . Ubrogepant (UBRELVY) 50 MG TABS Take 50 mg by mouth every 2 (two) hours as needed (migraine). Max of 100 mg (2 tablets) in 24 hours. 10 tablet 1   No facility-administered medications prior to visit.     PAST MEDICAL HISTORY: Past Medical History:  Diagnosis Date  . Anxiety   . Asthma   . Breast cyst, left 2019  . Depression   . Fibromyalgia   . History of stomach ulcers   . Lumbar herniated disc   . Migraine   . Seizures ([place])     PAST SURGICAL HISTORY: Past Surgical History:  Procedure Laterality Date  . APPENDECTOMY  1996  . CHOLECYSTECTOMY  2003  .  LAPAROSCOPIC OOPHERECTOMY Right 2006  . LAPAROSCOPIC OOPHERECTOMY Left 2007  . pevlic surgery  8099   Plevic mass  . TUBAL LIGATION  2003  . VAGINAL HYSTERECTOMY  2005    FAMILY HISTORY: Family History  Problem Relation Age of Onset  . Heart attack Father        nov 2018  . Diabetes Other   . Cancer Other   . Heart disease Other   . Stroke Other   . Anemia Other   . Fibromyalgia Other   . Seizures Neg Hx   . Migraines Neg Hx     SOCIAL HISTORY: Social History   Socioeconomic History  . Marital status: Married    Spouse name: Aaron Edelman  . Number of children: 3  . Years of education: 64  . Highest education level: Not on file  Occupational History  . Occupation: [employer] store    Comment: left dec 2018  . 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 Financial resource strain: Not on file  . Food insecurity    Worry: Not on file    Inability: Not on file  . Transportation needs    Medical: Not on file    Non-medical: Not on file  Tobacco Use  . Smoking status: Former Smoker    Packs/day: 0.50    Quit date: 2018    Years since quitting: 2.5  . Smokeless tobacco: Never Used  . Tobacco comment: black & mild  Substance and Sexual Activity  . Alcohol use: Not Currently    Comment: was social, no longer drinking   . Drug use: Not Currently    Comment: Marijuana- ocassionally (07/21/15)  . Sexual activity: Not on file  Lifestyle  . Physical activity    Days per week: Not on file    Minutes per session: Not on file  . Stress: Not on file  Relationships  . Social Herbalist on phone: Not on file    Gets together: Not on file    Attends religious service: Not on file    Active member of club or organization: Not on file    Attends meetings of clubs or organizations: Not on file    Relationship status: Not on file  . Intimate partner violence    Fear of current or ex partner: Not on file     Emotionally abused: Not on file    Physically abused: Not on file    Forced sexual activity: Not on file  Other Topics Concern  . Not on file  Social History Narrative   Lives: with spouse and 2 of her children  Caffeine use: 1 cup of tea daily   Right handed      PHYSICAL EXAM  Vitals:   08/14/18 1521  BP: 122/65  Pulse: 83  Temp: 97.7 F (36.5 C)  TempSrc: Oral  Weight: 224 lb (101.6 kg)  Height: 5\' 4"  (1.626 m)   Body mass index is 38.45 kg/m.  General: well developed, well nourished,  pleasant middle-aged African-American female, seated, in no evident distress Head: head normocephalic and atraumatic.   Neck: supple with no carotid or supraclavicular bruits Cardiovascular: regular rate and rhythm, no murmurs Musculoskeletal: no deformity Skin:  no rash/petichiae Vascular:  Normal pulses all extremities   Neurologic Exam Mental Status: Awake and fully alert. Oriented to place and time. Recent and remote memory intact. Attention span, concentration and fund of knowledge appropriate. Mood and affect appropriate.  Cranial Nerves: Fundoscopic exam reveals sharp disc margins. Pupils equal, briskly reactive to light. Extraocular movements full without nystagmus. Visual fields full to confrontation. Hearing intact. Facial sensation intact. Face, tongue, palate moves normally and symmetrically.  Motor: Normal bulk and tone. Normal strength in all tested extremity muscles. Sensory.: intact to touch , pinprick , position and vibratory sensation.  Coordination: Rapid alternating movements normal in all extremities. Finger-to-nose and heel-to-shin performed accurately bilaterally. Gait and Station: Arises from chair without difficulty. Stance is normal. Gait demonstrates normal stride length and balance Reflexes: 1+ and symmetric. Toes downgoing.    DIAGNOSTIC DATA (LABS, IMAGING, TESTING) - I reviewed patient records, labs, notes, testing and imaging myself where available.   Lab Results  Component Value Date   WBC 4.8 09/19/2017   HGB 12.7 09/19/2017   HCT 39.1 09/19/2017   MCV 82 09/19/2017   PLT 217 09/19/2017      Component Value Date/Time   NA 143 09/19/2017 0943   K 4.4 09/19/2017 0943   CL 105 09/19/2017 0943   CO2 24 09/19/2017 0943   GLUCOSE 80 09/19/2017 0943   BUN 10 09/19/2017 0943   CREATININE 0.66 09/19/2017 0943   CALCIUM 9.2 09/19/2017 0943   PROT 7.2 09/19/2017 0943   ALBUMIN 4.3 09/19/2017 0943   AST 17 09/19/2017 0943   ALT 15 09/19/2017 0943   ALKPHOS 81 09/19/2017 0943   BILITOT 0.4 09/19/2017 0943   GFRNONAA 113 09/19/2017 0943   GFRAA 131 09/19/2017 0943   No results found for: CHOL, HDL, LDLCALC, LDLDIRECT, TRIG, CHOLHDL No results found for: HGBA1C No results found for: VITAMINB12 No results found for: TSH    ASSESSMENT AND PLAN Taquisha H Resler is a 38 y.o. female who was evaluated by Dr. Rexene Alberts on 03/25/2018 due to excessive daytime fatigue and morning headaches with prior diagnosis of sleep apnea but unable to tolerate CPAP.  She completed home sleep test on 05/06/2018 which showed severe sleep apnea and recommended to initiate CPAP.  She also continues to be followed in this office by Dr. Jaynee Eagles for migraine management.  She continues to experience excessive daytime fatigue and morning headaches despite excellent compliance and adequate residual AHI.  Discussion regarding duration time for benefit with use of CPAP machine.  She continues to experience daytime fatigue despite ongoing usage after 4 to 6 months of usage, can consider additional testing/intervention at that time.  In regards to vertigo, could be related to BPPV versus migraines.  Discussion regarding vestibular rehab but would like to hold off at this time and will notify office if interested in the future.  She will follow-up with Amy, NP in 10/2018  as scheduled for migraine management and review of CPAP  Greater than 50% of this 25-minute visit was spent  discussing CPAP download, residual symptoms, BPPV versus migraine related vertigo and answering all questions to patient satisfaction    Venancio Poisson, MSN, AGNP-BC 08/14/2018, 3:37 PM [facility] [address], [place], [place] [address] [phone]  I reviewed the above note and documentation by the Nurse Practitioner and agree with the history, exam, assessment and plan as outlined above. I was immediately available for consultation. Star Age, MD, PhD [facility])

## 2018-09-04 ENCOUNTER — Telehealth: Payer: Self-pay | Admitting: Family Medicine

## 2018-09-04 NOTE — Telephone Encounter (Signed)
Cover My Meds called in and stated the [facility] Card is requiring a PA for it to be reused  CB# [phone]

## 2018-09-05 MED ORDER — UBRELVY 50 MG PO TABS: 50.0000 mg | ORAL_TABLET | ORAL | 1 refills | Status: DC | PRN

## 2018-09-05 NOTE — Telephone Encounter (Addendum)
I called and spoke to pharmacist at [facility].  I gave VO for ubrelvy 50mg  tablet # 10 with one refill.  She attempted to fill, required PA.  I initiated PA on CMM KEY [id number] Ubrelvy 50mg  tabs #10. detemination pending (could take up to 5 days).

## 2018-09-05 NOTE — Addendum Note (Signed)
Addended by: Brandon Melnick on: 09/05/2018 04:52 PM   Modules accepted: Orders

## 2018-09-09 ENCOUNTER — Encounter: Payer: Self-pay | Admitting: Family Medicine

## 2018-09-09 NOTE — Telephone Encounter (Signed)
I received a fax from [place] for ubrelvy.  Could not process due to not active eligibilty.  I called pt to verify her insurance.  LMVM to return call.

## 2018-09-10 NOTE — Telephone Encounter (Signed)
Resubmitted for ubrelvy with her [facility] insurance.  Denied medication and /or diagnosis not a covered benefit and rea excluded from coverage in accordance with terms and conditions of your plan benefit.  [phone]. We used chronic migraine as her diagnosis G43.711.

## 2018-09-10 NOTE — Telephone Encounter (Signed)
She was taking rizatriptan prior. She may return to that for abortive therapy. If not improving, she needs to be seen.

## 2018-09-12 NOTE — Telephone Encounter (Signed)
Updated [employer] Auth: [id number] (exp. 09/12/18 to 10/27/18) patient is scheduled at GI for 09/16/18.

## 2018-09-16 ENCOUNTER — Other Ambulatory Visit: Payer: Self-pay

## 2018-09-16 ENCOUNTER — Ambulatory Visit
Admission: RE | Admit: 2018-09-16 | Discharge: 2018-09-16 | Disposition: A | Discharge status: Home / Self Care | Payer: 59 | Source: Ambulatory Visit | Attending: Family Medicine | Admitting: Family Medicine

## 2018-09-16 DIAGNOSIS — G44221 Chronic tension-type headache, intractable: Secondary | ICD-10-CM

## 2018-09-16 MED ORDER — GADOBENATE DIMEGLUMINE 529 MG/ML IV SOLN
20.0000 mL | Freq: Once | INTRAVENOUS | Status: AC | PRN
  Administered 2018-09-16: 09:00:00 20 mL via INTRAVENOUS

## 2018-09-18 ENCOUNTER — Telehealth: Payer: Self-pay | Admitting: *Deleted

## 2018-09-18 ENCOUNTER — Encounter: Payer: Self-pay | Admitting: Radiology

## 2018-09-18 NOTE — Progress Notes (Signed)
I spoke to pt and relayed the results of her MRI per AL/NP as listed below.

## 2018-09-18 NOTE — Telephone Encounter (Signed)
-----   Message from Debbora Presto, NP sent at 09/17/2018 10:25 AM EDT ----- Please let her know that her MRI results are back. There is a mildly enlarged sella turnica that can be an incidental finding but could also be related to her headaches.  I am concerned about a condition called Idiopathic Intracranial Hypertension. This is where there is too much fluid around the brain and spinal column. This can lead to persistent headaches and in severe cases, vision changes/loss. I would like her to have additional work up. We can order a lumbar puncture where they will place a needle in her spine to measure and evaluate spinal fluid. This will help [place] formally diagnose IIH. I also need her to see her ophthalmologist asap for an eye exam if not done in the past 3 months. If she doesn't have one we can refer her to Dr Katy Fitch. I am more than happy to see her in the office to discuss more in detail and help explain the LP procedure more in detail if she wishes.

## 2018-09-18 NOTE — Telephone Encounter (Deleted)
  Please let her know that her MRI results are back. There is a mildly enlarged sella turnica that can be an incidental finding but could also be related to her headaches. I am concerned about a condition called Idiopathic Intracranial Hypertension. This is where there is too much fluid around the brain and spinal column. This can lead to persistent headaches and in severe cases, vision changes/loss. I would like her to have additional work up. We can order a lumbar puncture where they will place a needle in her spine to measure and evaluate spinal fluid. This will help [place] formally diagnose IIH. I also need her to see her ophthalmologist asap for an eye exam if not done in the past 3 months. If she doesn't have one we can refer her to Dr Katy Fitch. I am more than happy to see her in the office to discuss more in detail and help explain the LP procedure more in detail if she wishes.

## 2018-09-18 NOTE — Telephone Encounter (Signed)
I called pt and relayed to her as below per AL/NP::  I let her know that her MRI results are back. There is a mildly enlarged sella turnica that can be an incidental finding but could also be related to her headaches. She is concerned about a condition called Idiopathic Intracranial Hypertension. This is where there is too much fluid around the brain and spinal column. This can lead to persistent headaches and in severe cases, vision changes/loss. She  would like you to have additional work up. We can order a lumbar puncture where they will place a needle in her spine to measure and evaluate spinal fluid. This will help [place] formally diagnose IIH. She also wants her to see her ophthalmologist asap for an eye exam if not done in the past 3 months. If she doesn't have one we can refer her to Dr Katy Fitch. I am more than happy to see her in the office to discuss more in detail and help explain the LP procedure more in detail if she wishes.  Mychart VV made 09-24-18 at 1300.  This is her lunch time and will have questions at that time.

## 2018-09-18 NOTE — Telephone Encounter (Signed)
You can call and get her scheduled for a follow up with me. No problem. We can do virtual if that is easier as well. Thank you.

## 2018-09-24 ENCOUNTER — Encounter: Payer: Self-pay | Admitting: Family Medicine

## 2018-09-24 ENCOUNTER — Telehealth (INDEPENDENT_AMBULATORY_CARE_PROVIDER_SITE_OTHER): Payer: 59 | Admitting: Family Medicine

## 2018-09-24 DIAGNOSIS — E669 Obesity, unspecified: Secondary | ICD-10-CM

## 2018-09-24 DIAGNOSIS — G44221 Chronic tension-type headache, intractable: Secondary | ICD-10-CM

## 2018-09-24 DIAGNOSIS — R519 Headache, unspecified: Secondary | ICD-10-CM

## 2018-09-24 DIAGNOSIS — R51 Headache: Secondary | ICD-10-CM

## 2018-09-24 DIAGNOSIS — E236 Other disorders of pituitary gland: Secondary | ICD-10-CM

## 2018-09-24 DIAGNOSIS — G43711 Chronic migraine without aura, intractable, with status migrainosus: Secondary | ICD-10-CM | POA: Diagnosis not present

## 2018-09-24 NOTE — Progress Notes (Signed)
PATIENT: Marie Rios DOB: [birth year]  REASON FOR VISIT: follow up HISTORY FROM: patient  Virtual Visit via Telephone Note  I connected with [name] on 09/24/18 at  1:00 PM EDT by telephone and verified that I am speaking with the correct person using two identifiers.   I discussed the limitations, risks, security and privacy concerns of performing an evaluation and management service by telephone and the availability of in person appointments. I also discussed with the patient that there may be a patient responsible charge related to this service. The patient expressed understanding and agreed to proceed.   History of Present Illness:  09/24/18 Marie Rios Diss is a 38 y.o. female here today for follow up of migraines. She reports that headaches occur nearly daily. Migraines are improved but continue to occur regularly. She continues Emgality monthly. She has also started CPAP therapy with excellent compliance. She has not noticed much improvement since starting CPAP. She did have MRI that showed concerns of mildly enlarged sella turnica that could be incidental or related to IIH. She has never been treated in the past for IIH. She has taken topiramate and Zonegran in the past with reports of psychotic events and worsening anger. She reports last eye exam was about a year ago and normal. She denies ringing in ears, vision changes, or loss of vision. Headaches are tension type headaches and occur often in the morning hours.   HISTORY: (copied from my note on 04/08/2018)  Marie Rios is a 38 y.o. female here today for follow up for migraines and dizziness (chronic). She was seen on 3/2 by PCP for worsening migraines and an episode of what she feels was syncope. She reports that she fell and hit her head after standing to go to the kitchen. Her husband came to her aid after hearing her fall. She was sluggish following but no worrisome findings. No seizure like activity,  incontinence, or biting tongue. She was given migraine cocktail of Torodol, Phenergan and Benadryl in office. On 3/4 she contacted PCP with reports of another syncopal episode (after standing up) and no improvement of headache. She was given steroid burst. She did not feel that this helped her headache but did improve dizziness. She reports getting dizzy with standing or turning her head too fast. She is not drinking water regularly. She is taking Emgality monthly as prescribed. She uses Maxalt, Zofran and tizanidine as needed for abortive therapy. She reports that this combination usually helps but has not this past week. She admits that she does not drink much fluids throughout the day.   She is being evaluated by Dr Rexene Alberts for possible sleep apnea and has upcoming sleep study pending insurance approval.   She has questionable seizure activity when angry or stressed. She reports last event was 06/2017. She has not taken Depakote in 2 years.   HISTORY: (copied from Dr Cathren Laine note on 01/15/2018)  Interval update for migraines (seziures may be non-epileptic events). Started Topiramatebut could not tolerate. Also started Ajovy (has latex allergycan't take aimovis) but insurance required [place] to try Emgality which was approved. Emgalityishelping with migraines. Sent in zofran for acute management. She was on Depakote in the past,seizure-like eventshappen when angry and stressed. MRI of the brain was ordered but not completed. CBC and CMP were normal.   Having nausea after dose of Emgality. Takes Zofran prior to dose and this helps. Has 5 headaches per month/ 3-4 migraines per month. This is a 50% improvement.  Couldn't tolerate Trokendi nor Zonegran due to anger and "psychotic episodes."   No seizure like activity since starting Emgality. Last episode was 06/2017.   Still snoring but does not feel it is as bad. 1-2 mornings she wakes up with headaches. She wakes daily with dry mouth. Having  daytime sleepiness. Was advised sleep study  Interval history 09/19/2017: Has not seen patient in approx 2 years. Migraines worsened last year in the setting of stress. She has not taken Depakote in 4 months, she was tired of it. She had reported seizure in June of this year, discussed no driving restrictions. She stopped taking her Depakote. Over the last year she has 15 headache days a month and 8 are migrainous. Migraines are She has light sensitivity, sounds sensitivity, ears ringing, dizziness, vertigo, locations in the frontal area occ in the right occipital. Feels stabbing, throbbing. It can last up to 2 days. Unilateral migraines, pulsating and pounding and throbbing, She gets nausea no vomiting, She has light sensitivity, sounds sensitivity, ears ringing, dizziness, vertigo, locations in the frontal area occ in the right occipital. Feels stabbing, throbbing. It can last up to 2 days and be severe. She gets nausea no vomiting. She has untreated sleep apnea. She takes excedrin every day. She has positional headaches and also vision blurriness and vision changes. Untreated sleep apnea with severe sleep apnea.  - requested records from previous sleep doctor, pending  - reviewed sleep report and data, AHI in 2011 to a max of 121 in REM sleep, was titrated on 10cm in 2011  Interval history 12/01/2015: Patient is doing well. One migraine a month. Tolerating Depakote well. No more events of alteration of consciousness. The maxalt works with the zofran, she has not tried the [place]. It takes several hours for the maxalt to work, she has to take it twice. Suggest taking the maxalt with the zofran and cambia. She had hysterectomy.   [id number] H Marie Rios a 38 y.o.femalehere as a referral from Dr. Joana Reamer Migraines and seizures in the setting of stress and anxiety.migraines Started 10 years ago. She has the migraines twice a month. She has light sensitivity, sounds sensitivity, ears ringing,  dizziness, vertigo, locations in the frontal area occ in the right occipital. Feels stabbing, throbbing. It can last up to 2 days. She gets nausea no vomiting. She has diarrhea sometimes. She is on Depakote but just recently restarted, she was successfully treated with Depakote in the past but was feeling better and stopped it. She has had a hysterectomy. She has been on Depakote off and on for 20 years. Also on Dilantin. She has a history of seizures as well. The seizures always happen when she gets stressed or with anger or anxiety , she passes out and jerks and shakes and wakes up confused. Workup has been negative. EEGs have been normal. Last time she had a seizure was on June 6th. She was seeing a neurologist. She was on Topiramate 200mg  twice a day. She was seeing a doctor in [place] and the Topiramate was too high. She was angry earlier this month, she was frstrated, she "passed out". She says she got dizzy, vertigo, she came to with a cloth over her head and something cold over her neck. It lasted one minute. Husband says she will jerk than shake back and forth. Only when she stresses. This was the first one in a year. The seizures are always with stress or emotion that triggers it. She was on Depakote twice a day  for many years. No other focal neurologic deficits or complaints.  Reviewed notes, labs and imaging from outside physicians, which showed: Patient was seen at [facility] for evaluation of vertigo, symptoms started in July 2012 with elevated Dilantin levels in the emergency room. Stress exacerbate her symptoms. She has participated and vestibular rehabilitation. The rehabilitation and improved her dizziness been no change in nausea. She reported associated seizures and migraines and associated sensitivity to light. On examination tests of ocular motor function revealed normal saccades, pursuit and optokinetic eye movements. There was no spontaneous nystagmus or gaze evoked nystagmus, rotary chair  testing revealed normal vestibular ocular reflex gain, phase and symmetry at all frequencies testing indicating affective vestibular control. Dix-Hallpike's tests were negative for posterior semicircular canal BPPV, roll tests were negative for horizontal semicircular canal BPPV, caloric stimulation showed symmetric vestibular responses. Diagnosis was normal peripheral and central vestibular functions.   Observations/Objective:  Generalized: Well developed, in no acute distress  Mentation: Alert oriented to time, place, history taking. Follows all commands speech and language fluent   Assessment and Plan:  38 y.o. year old female  has a past medical history of Anxiety, Asthma, Breast cyst, left (2019), Depression, Fibromyalgia, History of stomach ulcers, Lumbar herniated disc, Migraine, and Seizures ([place]). here with    ICD-10-CM   1. Empty sella (HCC)  E23.6 DG FLUORO GUIDED LOC OF NEEDLE/CATH TIP FOR SPINAL INJECT LT  2. Chronic tension-type headache, intractable  G44.221 DG FLUORO GUIDED LOC OF NEEDLE/CATH TIP FOR SPINAL INJECT LT  3. Chronic migraine without aura, with intractable migraine, so stated, with status migrainosus  G43.711   4. Morning headache  R51 DG FLUORO GUIDED LOC OF NEEDLE/CATH TIP FOR SPINAL INJECT LT  5. Obesity (BMI 30-39.9)  E66.9 Ambulatory referral to [facility]   Unfortunately, Shelton Silvas continues to have consistent tension type headaches despite excellent compliance with CPAP therapy.  We have discussed MRI results today.  I have explained to [place] that results could be incidental or could represent concerning findings related to idiopathic intracranial hypertension.  Patient wishes to continue with lumbar puncture.  I have placed orders today.  She is not interested in starting any medication at this time.  She was advised to update eye exam with ophthalmologist.  She will get [place] a copy of those results once completed.  We will follow-up pending lumbar puncture  results.  I will refer her to the healthy weight loss clinic.  She was advised to focus on a well-balanced diet and regular exercise.  She verbalizes understanding and agreement with this plan.   Orders Placed This Encounter  Procedures  . DG FLUORO GUIDED LOC OF NEEDLE/CATH TIP FOR SPINAL INJECT LT    Standing Status:   Future    Standing Expiration Date:   11/24/2019    Order Specific Question:   Lab orders requested (DO NOT place separate lab orders, these will be automatically ordered during procedure specimen collection):    Answer:   CSF cell count w differential    Order Specific Question:   Lab orders requested (DO NOT place separate lab orders, these will be automatically ordered during procedure specimen collection):    Answer:   CSF culture    Order Specific Question:   Reason for Exam (SYMPTOM  OR DIAGNOSIS REQUIRED)    Answer:   empty sella, chronic tension headaches, abnormal MRI    Order Specific Question:   Is the patient pregnant?    Answer:   No  Order Specific Question:   Preferred Imaging Location?    Answer:   [id number]    Order Specific Question:   Radiology Contrast Protocol - do NOT remove file path    Answer:   [email or web address]  . Ambulatory referral to [facility]    Referral Priority:   Routine    Referral Type:   Consultation    Referral Reason:   Specialty Services Required    Requested Specialty:   Family Medicine    Number of Visits Requested:   1    No orders of the defined types were placed in this encounter.    Follow Up Instructions:  I discussed the assessment and treatment plan with the patient. The patient was provided an opportunity to ask questions and all were answered. The patient agreed with the plan and demonstrated an understanding of the instructions.   The patient was advised to call back or seek an in-person evaluation if the symptoms worsen or if the condition fails to improve  as anticipated.  I provided 25 minutes of non-face-to-face time during this encounter.  Patient is located at her place of residence during video visit.  Provider is located in the office.   Debbora Presto, NP

## 2018-09-24 NOTE — Progress Notes (Signed)
Made any corrections needed, and agree with history, physical, neuro exam,assessment and plan as stated.     Skarlett Sedlacek, MD [facility]  

## 2018-09-27 ENCOUNTER — Encounter: Payer: Self-pay | Admitting: Family Medicine

## 2018-10-04 ENCOUNTER — Ambulatory Visit
Admission: RE | Admit: 2018-10-04 | Discharge: 2018-10-04 | Disposition: A | Discharge status: Home / Self Care | Payer: 59 | Source: Ambulatory Visit | Attending: Family Medicine | Admitting: Family Medicine

## 2018-10-04 ENCOUNTER — Other Ambulatory Visit: Payer: 59

## 2018-10-04 ENCOUNTER — Other Ambulatory Visit: Payer: Self-pay

## 2018-10-04 VITALS — BP 102/58 | HR 69

## 2018-10-04 DIAGNOSIS — G43709 Chronic migraine without aura, not intractable, without status migrainosus: Secondary | ICD-10-CM

## 2018-10-04 DIAGNOSIS — E236 Other disorders of pituitary gland: Secondary | ICD-10-CM

## 2018-10-04 DIAGNOSIS — R519 Headache, unspecified: Secondary | ICD-10-CM

## 2018-10-04 DIAGNOSIS — G44221 Chronic tension-type headache, intractable: Secondary | ICD-10-CM

## 2018-10-04 NOTE — Discharge Instructions (Signed)

## 2018-10-08 ENCOUNTER — Telehealth: Payer: Self-pay | Admitting: Radiology

## 2018-10-08 ENCOUNTER — Encounter: Payer: 59 | Admitting: Obstetrics & Gynecology

## 2018-10-08 ENCOUNTER — Other Ambulatory Visit: Payer: Self-pay | Admitting: Family Medicine

## 2018-10-08 ENCOUNTER — Other Ambulatory Visit: Payer: Self-pay

## 2018-10-08 ENCOUNTER — Encounter: Payer: Self-pay | Admitting: Family Medicine

## 2018-10-08 ENCOUNTER — Ambulatory Visit
Admission: RE | Admit: 2018-10-08 | Discharge: 2018-10-08 | Disposition: A | Discharge status: Home / Self Care | Payer: 59 | Source: Ambulatory Visit | Attending: Family Medicine | Admitting: Family Medicine

## 2018-10-08 DIAGNOSIS — G971 Other reaction to spinal and lumbar puncture: Secondary | ICD-10-CM

## 2018-10-08 MED ORDER — IOPAMIDOL (ISOVUE-M 200) INJECTION 41%: 1.0000 mL | Freq: Once | INTRAMUSCULAR | Status: DC

## 2018-10-08 NOTE — Telephone Encounter (Signed)
Left message to call [id number] to reschedule appointment

## 2018-10-08 NOTE — Discharge Instructions (Signed)

## 2018-10-09 ENCOUNTER — Ambulatory Visit: Payer: 59 | Admitting: Family Medicine

## 2018-10-09 LAB — CSF CELL COUNT WITH DIFFERENTIAL
RBC Count, CSF: 0 cells/uL (ref 0–10)
WBC, CSF: 0 cells/uL (ref 0–5)

## 2018-10-09 LAB — CSF CULTURE W GRAM STAIN
GRAM STAIN:: NONE SEEN
MICRO NUMBER:: 849216
Result:: NO GROWTH
SPECIMEN QUALITY:: ADEQUATE

## 2018-10-10 ENCOUNTER — Other Ambulatory Visit: Payer: Self-pay | Admitting: Neurology

## 2018-10-10 MED ORDER — PREDNISONE 5 MG PO TABS: ORAL_TABLET | ORAL | 0 refills | Status: DC

## 2018-10-21 ENCOUNTER — Encounter: Payer: Self-pay | Admitting: Family Medicine

## 2018-10-22 ENCOUNTER — Ambulatory Visit (INDEPENDENT_AMBULATORY_CARE_PROVIDER_SITE_OTHER): Payer: 59 | Admitting: Obstetrics & Gynecology

## 2018-10-22 ENCOUNTER — Other Ambulatory Visit: Payer: Self-pay

## 2018-10-22 ENCOUNTER — Other Ambulatory Visit: Payer: Self-pay | Admitting: Obstetrics & Gynecology

## 2018-10-22 ENCOUNTER — Encounter: Payer: Self-pay | Admitting: Obstetrics & Gynecology

## 2018-10-22 VITALS — BP 115/77 | HR 76

## 2018-10-22 DIAGNOSIS — Z113 Encounter for screening for infections with a predominantly sexual mode of transmission: Secondary | ICD-10-CM

## 2018-10-22 DIAGNOSIS — N898 Other specified noninflammatory disorders of vagina: Secondary | ICD-10-CM

## 2018-10-22 DIAGNOSIS — N760 Acute vaginitis: Secondary | ICD-10-CM

## 2018-10-22 DIAGNOSIS — B3731 Acute candidiasis of vulva and vagina: Secondary | ICD-10-CM

## 2018-10-22 DIAGNOSIS — N951 Menopausal and female climacteric states: Secondary | ICD-10-CM

## 2018-10-22 DIAGNOSIS — B9689 Other specified bacterial agents as the cause of diseases classified elsewhere: Secondary | ICD-10-CM | POA: Diagnosis not present

## 2018-10-22 DIAGNOSIS — B373 Candidiasis of vulva and vagina: Secondary | ICD-10-CM | POA: Diagnosis not present

## 2018-10-22 DIAGNOSIS — D242 Benign neoplasm of left breast: Secondary | ICD-10-CM

## 2018-10-22 MED ORDER — EST ESTROGENS-METHYLTEST 1.25-2.5 MG PO TABS: 1.0000 | ORAL_TABLET | Freq: Every day | ORAL | 5 refills | Status: DC

## 2018-10-22 NOTE — Progress Notes (Signed)
GYNECOLOGY OFFICE VISIT NOTE  History:   Marie Rios is a 38 y.o. F s/p TVH/BSO for benign indications here today to establish care and for evaluation of abnormal vaginal discharge.  Reports having some brown discharge periodically associated with vaginal dryness; also frequent UTIs.  Was on estrogen-testosterone pills in past while under past gynecologist's care in [place], has not taken in about two years.  Also reports decreased libido. History of left breast fibroadenoma with negative evaluation, needed follow up diagnostic mammogram as per her records. She denies any current abnormal vaginal bleeding, pelvic pain or other concerns.    Past Medical History:  Diagnosis Date  . Anxiety   . Asthma   . Breast cyst, left 2019  . Depression   . Fibromyalgia   . History of stomach ulcers   . Lumbar herniated disc   . Migraine   . Seizures ([place])     Past Surgical History:  Procedure Laterality Date  . APPENDECTOMY  1996  . CHOLECYSTECTOMY  2003  . LAPAROSCOPIC OOPHERECTOMY Right 2006  . LAPAROSCOPIC OOPHERECTOMY Left 2007  . pevlic surgery  [id number]   Plevic mass  . TUBAL LIGATION  2003  . VAGINAL HYSTERECTOMY  2005    The following portions of the patient's history were reviewed and updated as appropriate: allergies, current medications, past family history, past medical history, past social history, past surgical history and problem list.    Review of Systems:  Pertinent items noted in HPI and remainder of comprehensive ROS otherwise negative.  Physical Exam:  BP 115/77   Pulse 76  CONSTITUTIONAL: Well-developed, well-nourished female in no acute distress.  HEENT:  Normocephalic, atraumatic. External right and left ear normal. No scleral icterus.  NECK: Normal range of motion, supple, no masses noted on observation SKIN: No rash noted. Not diaphoretic. No erythema. No pallor. MUSCULOSKELETAL: Normal range of motion. No edema noted. NEUROLOGIC: Alert and oriented to  person, place, and time. Normal muscle tone coordination. No cranial nerve deficit noted. PSYCHIATRIC: Normal mood and affect. Normal behavior. Normal judgment and thought content. CARDIOVASCULAR: Normal heart rate noted RESPIRATORY: Effort and breath sounds normal, no problems with respiration noted ABDOMEN: No masses noted. No other overt distention noted.   PELVIC: Normal appearing external genitalia; normal appearing vaginal mucosa and cuff. Mild atrophy noted. Good urethral support.  White, thin discharge noted, testing sample obtained. No abnormal masses palpated.     Assessment and Plan:    1. Fibroadenoma of left breast Breast imaging ordered, will follow up results and manage accordingly. - MM DIAG BREAST TOMO BILATERAL; Future - US BREAST LTD UNI LEFT INC AXILLA; Future  2. Vaginal discharge - Cervicovaginal ancillary only( [place]) done, will follow up results and manage accordingly.  3. Vasomotor symptoms due to menopause Patient desired hormone replacement therapy that she was taking in [place], this was prescribed. Aware of side effect profile.  I am hopeful this will help with her symptoms.  Will follow up in 2 months.  - estrogens-methylTEST (ESTRATEST) 1.25-2.5 MG tablet; Take 1 tablet by mouth daily.  Dispense: 30 tablet; Refill: 5  Routine preventative health maintenance measures emphasized. Please refer to After Visit Summary for other counseling recommendations.   Return in about 2 months (around 12/22/2018) for Followup.    Total face-to-face time with patient: 20 minutes.  Over 50% of encounter was spent on counseling and coordination of care.   Verita Schneiders, MD, [place] for [facility], [place]

## 2018-10-22 NOTE — Patient Instructions (Signed)
Return to clinic for any scheduled appointments or for any gynecologic concerns as needed.   

## 2018-10-24 LAB — CERVICOVAGINAL ANCILLARY ONLY
Bacterial Vaginitis (gardnerella): POSITIVE — AB
Candida Glabrata: NEGATIVE
Candida Vaginitis: POSITIVE — AB
Molecular Disclaimer: NEGATIVE
Molecular Disclaimer: NEGATIVE
Molecular Disclaimer: NEGATIVE
Molecular Disclaimer: NORMAL
Trichomonas: NEGATIVE

## 2018-10-25 ENCOUNTER — Encounter: Payer: Self-pay | Admitting: Family Medicine

## 2018-10-25 LAB — CERVICOVAGINAL ANCILLARY ONLY
Chlamydia: NEGATIVE
Neisseria Gonorrhea: NEGATIVE

## 2018-10-26 MED ORDER — METRONIDAZOLE 500 MG PO TABS: 500.0000 mg | ORAL_TABLET | Freq: Two times a day (BID) | ORAL | 0 refills | Status: DC

## 2018-10-26 MED ORDER — FLUCONAZOLE 150 MG PO TABS: 150.0000 mg | ORAL_TABLET | Freq: Once | ORAL | 3 refills | Status: AC

## 2018-10-26 NOTE — Addendum Note (Signed)
Addended by: Verita Schneiders A on: 10/26/2018 04:13 PM   Modules accepted: Orders

## 2018-10-29 ENCOUNTER — Ambulatory Visit (INDEPENDENT_AMBULATORY_CARE_PROVIDER_SITE_OTHER): Payer: 59 | Admitting: Family Medicine

## 2018-10-29 ENCOUNTER — Other Ambulatory Visit: Payer: Self-pay

## 2018-10-29 ENCOUNTER — Encounter: Payer: Self-pay | Admitting: Family Medicine

## 2018-10-29 DIAGNOSIS — G43109 Migraine with aura, not intractable, without status migrainosus: Secondary | ICD-10-CM

## 2018-10-29 NOTE — Progress Notes (Signed)
Chief Complaint  Patient presents with  . Follow-up    complete FMLA    Subjective: Patient is a 38 y.o. female here for completion of a form. Due to COVID-19 pandemic, we are interacting via web portal for an electronic face-to-face visit. I verified patient's ID using 2 identifiers. Patient agreed to proceed with visit via this method. Patient is in a parked car, I am at office. Patient and I are present for visit.   Patient here to have FMLA completed regarding migraines with aura.  She is currently following with neurology and is on Emgality monthly.  She says it is somewhat helpful but she is still having 8-9 headaches per month lasting 1-2 days per episode.  She had a lumbar puncture on 9/4 and things got worse.  She did need a blood patch.  No current headaches.  She does get auras.  ROS: Neuro: As noted in HPI  Past Medical History:  Diagnosis Date  . Anxiety   . Asthma   . Breast cyst, left 2019  . Depression   . Fibromyalgia   . History of stomach ulcers   . Lumbar herniated disc   . Migraine   . Seizures (HCC)     Objective: No conversational dyspnea Age appropriate judgment and insight Nml affect and mood  Assessment and Plan: Migraine with aura and without status migrainosus, not intractable  Forms completed, will make a copy and fax in addition to mailing her original. The patient voiced understanding and agreement to the plan.  [place], DO 10/29/18  1:39 PM

## 2018-11-01 ENCOUNTER — Ambulatory Visit
Admission: RE | Admit: 2018-11-01 | Discharge: 2018-11-01 | Disposition: A | Discharge status: Home / Self Care | Payer: 59 | Source: Ambulatory Visit | Attending: Obstetrics & Gynecology | Admitting: Obstetrics & Gynecology

## 2018-11-01 ENCOUNTER — Other Ambulatory Visit: Payer: Self-pay

## 2018-11-01 DIAGNOSIS — D242 Benign neoplasm of left breast: Secondary | ICD-10-CM

## 2018-11-06 ENCOUNTER — Encounter: Payer: Self-pay | Admitting: Family Medicine

## 2018-11-11 ENCOUNTER — Encounter: Payer: Self-pay | Admitting: Family Medicine

## 2018-11-11 ENCOUNTER — Ambulatory Visit (INDEPENDENT_AMBULATORY_CARE_PROVIDER_SITE_OTHER): Payer: 59 | Admitting: Family Medicine

## 2018-11-11 DIAGNOSIS — M542 Cervicalgia: Secondary | ICD-10-CM | POA: Diagnosis not present

## 2018-11-11 MED ORDER — PREDNISONE 5 MG PO TABS: ORAL_TABLET | ORAL | 0 refills | Status: DC

## 2018-11-11 NOTE — Progress Notes (Signed)
She isn't lightheaded.    She has prev spinal tap noted along with a history of migraines.    Neck and shoulder pain.  Pain with neck ROM.  Pain on R side, no L shoulder pain.  Normal ROM to the L, more pain turning to the R.  She has neck pain but not a stiff neck.    Woke up with sx 2 days ago.  No help with headache medicine at that point.  Pain radiates to R hand, with numbness and tingling.  No L sided paresthesia.    She has baseline R leg paresthesias but that is an old finding.   Recent MRI w/o CVA.  No FCNAVD.  This isn't her typical migraine sx.    Meds, vitals, and allergies reviewed.   ROS: Per HPI unless specifically indicated in ROS section   nad ncat Neck supple, not stiff, but pain with range of motion, especially looking to the right.  Right posterior paraspinal muscles tender without rash. R arm paresthesia but intact strength and DTR BUE.  rrr ctab Gait normal.

## 2018-11-11 NOTE — Patient Instructions (Signed)
Likely muscle spasm and an irritated nerve in your neck.  Prednisone with food.  Update me if not better.  Use the muscle relaxer at night.  Take care.  Glad to see you.

## 2018-11-13 DIAGNOSIS — M542 Cervicalgia: Secondary | ICD-10-CM | POA: Insufficient documentation

## 2018-11-13 NOTE — Assessment & Plan Note (Signed)
Woke up with symptoms, without trauma.  Right-sided neck pain with pain radiating down the right arm.  Presumed radicular source.  Discussed with patient about options.  Reasonable to use prednisone in the meantime along with tizanidine at night.  Routine cautions on both medications given.  Imaging is not likely to change the plan at this point.  She agrees with plan.  She will update me as needed.

## 2018-11-14 ENCOUNTER — Telehealth: Payer: Self-pay

## 2018-11-14 DIAGNOSIS — M792 Neuralgia and neuritis, unspecified: Secondary | ICD-10-CM

## 2018-11-14 MED ORDER — GABAPENTIN 100 MG PO CAPS: 100.0000 mg | ORAL_CAPSULE | Freq: Three times a day (TID) | ORAL | 0 refills | Status: DC | PRN

## 2018-11-14 NOTE — Telephone Encounter (Signed)
D/w pt.   Reasonable to try gabapentin.  rx sent.  Patient aware.  Routine cautions d/w pt.  Would continue prednisone for now.  She agrees.  She can update me as needed.  Routed to PCP as FYI.
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
## 2018-11-15 NOTE — Telephone Encounter (Signed)
Noted  

## 2018-11-18 ENCOUNTER — Other Ambulatory Visit: Payer: Self-pay | Admitting: Family Medicine

## 2018-11-18 ENCOUNTER — Encounter: Payer: Self-pay | Admitting: Family Medicine

## 2018-11-18 ENCOUNTER — Ambulatory Visit (INDEPENDENT_AMBULATORY_CARE_PROVIDER_SITE_OTHER)
Admission: RE | Admit: 2018-11-18 | Discharge: 2018-11-18 | Disposition: A | Discharge status: Home / Self Care | Payer: 59 | Source: Ambulatory Visit | Attending: Family Medicine | Admitting: Family Medicine

## 2018-11-18 ENCOUNTER — Other Ambulatory Visit: Payer: Self-pay

## 2018-11-18 ENCOUNTER — Ambulatory Visit
Admission: RE | Admit: 2018-11-18 | Discharge: 2018-11-18 | Disposition: A | Discharge status: Home / Self Care | Payer: 59 | Source: Ambulatory Visit | Attending: Family Medicine | Admitting: Family Medicine

## 2018-11-18 DIAGNOSIS — M792 Neuralgia and neuritis, unspecified: Secondary | ICD-10-CM

## 2018-11-18 DIAGNOSIS — M542 Cervicalgia: Secondary | ICD-10-CM

## 2018-11-18 MED ORDER — HYDROCODONE-ACETAMINOPHEN 5-325 MG PO TABS: 0.5000 | ORAL_TABLET | Freq: Four times a day (QID) | ORAL | 0 refills | Status: DC | PRN

## 2018-11-18 NOTE — Telephone Encounter (Signed)
Given that she has pain radiating to her R hand, I don't think this is a primary shoulder issue.  Would check C spine films, I put in the order.  I would get that done then go from there.  We can see about med options after that.  She may end up needing PT vs referral if her pain still persists.

## 2018-11-18 NOTE — Addendum Note (Signed)
Addended by: Tonia Ghent on: 11/18/2018 08:40 AM   Modules accepted: Orders

## 2018-11-18 NOTE — Telephone Encounter (Signed)
Pt said gabapentin made pt feel drowsy and irritable. Pt finished prednisone on 11/16/18 and pt sees no improvement. Pain is more in rt side of neck into rt shoulder and pt does not see improvement after finishing prednisone. [facility].not resting at night due to pain. Pain level now is 7. Please advise.

## 2018-11-18 NOTE — Telephone Encounter (Signed)
Patient advised.

## 2018-11-30 ENCOUNTER — Other Ambulatory Visit: Payer: Self-pay

## 2018-11-30 ENCOUNTER — Ambulatory Visit
Admission: RE | Admit: 2018-11-30 | Discharge: 2018-11-30 | Disposition: A | Discharge status: Home / Self Care | Payer: 59 | Source: Ambulatory Visit | Attending: Family Medicine | Admitting: Family Medicine

## 2018-11-30 DIAGNOSIS — M542 Cervicalgia: Secondary | ICD-10-CM

## 2018-12-02 ENCOUNTER — Other Ambulatory Visit: Payer: Self-pay | Admitting: Family Medicine

## 2018-12-02 DIAGNOSIS — M792 Neuralgia and neuritis, unspecified: Secondary | ICD-10-CM

## 2018-12-12 ENCOUNTER — Encounter: Payer: Self-pay | Admitting: Obstetrics & Gynecology

## 2018-12-12 ENCOUNTER — Telehealth (INDEPENDENT_AMBULATORY_CARE_PROVIDER_SITE_OTHER): Payer: 59 | Admitting: Obstetrics & Gynecology

## 2018-12-12 ENCOUNTER — Other Ambulatory Visit: Payer: Self-pay

## 2018-12-12 DIAGNOSIS — R6882 Decreased libido: Secondary | ICD-10-CM | POA: Insufficient documentation

## 2018-12-12 DIAGNOSIS — R5382 Chronic fatigue, unspecified: Secondary | ICD-10-CM | POA: Diagnosis not present

## 2018-12-12 DIAGNOSIS — N951 Menopausal and female climacteric states: Secondary | ICD-10-CM | POA: Diagnosis not present

## 2018-12-12 MED ORDER — EST ESTROGENS-METHYLTEST 1.25-2.5 MG PO TABS: 1.0000 | ORAL_TABLET | Freq: Every day | ORAL | 5 refills | Status: DC

## 2018-12-12 NOTE — Progress Notes (Signed)
I connected with  [name] on 12/12/18 at  1:15 PM EST by telephone and verified that I am speaking with the correct person using two identifiers.   I discussed the limitations, risks, security and privacy concerns of performing an evaluation and management service by telephone and the availability of in person appointments. I also discussed with the patient that there may be a patient responsible charge related to this service. The patient expressed understanding and agreed to proceed.  [place], CMA 12/12/2018  1:10 PM

## 2018-12-12 NOTE — Progress Notes (Signed)
TELEHEALTH GYNECOLOGY VIRTUAL VIDEO VISIT ENCOUNTER NOTE  Provider location: [facility] at [facility]   I connected with Marie Rios on 12/12/18 at  1:15 PM EST by MyChart Video Encounter at home and verified that I am speaking with the correct person using two identifiers.   I discussed the limitations, risks, security and privacy concerns of performing an evaluation and management service virtually and the availability of in person appointments. I also discussed with the patient that there may be a patient responsible charge related to this service. The patient expressed understanding and agreed to proceed.   History:  Marie Rios is a 38 y.o. female s/p TVH/BSO for benign indications being followed up today after initiation of hormone therapy for treatment of menopausal vasomotor symptoms and decreased libido.  Reports marked improvement with hot flashes and vaginal dryness, minimal change with her libido. Still feels very tired all the time. She denies any abnormal vaginal discharge, bleeding, pelvic pain or other concerns.       Past Medical History:  Diagnosis Date  . Anxiety   . Asthma   . Breast cyst, left 2019  . Depression   . Fibromyalgia   . History of stomach ulcers   . Lumbar herniated disc   . Migraine   . Seizures ([place])    Past Surgical History:  Procedure Laterality Date  . APPENDECTOMY  1996  . CHOLECYSTECTOMY  2003  . LAPAROSCOPIC OOPHERECTOMY Right 2006  . LAPAROSCOPIC OOPHERECTOMY Left 2007  . pevlic surgery  [id number]   Plevic mass  . TUBAL LIGATION  2003  . VAGINAL HYSTERECTOMY  2005   The following portions of the patient's history were reviewed and updated as appropriate: allergies, current medications, past family history, past medical history, past social history, past surgical history and problem list.   Health Maintenance:  Normal mammogram on 11/01/2018   Review of Systems:  Pertinent items noted in HPI and remainder  of comprehensive ROS otherwise negative.  Physical Exam:   General:  Alert, oriented and cooperative. Patient appears to be in no acute distress.  Mental Status: Normal mood and affect. Normal behavior. Normal judgment and thought content.   Respiratory: Normal respiratory effort, no problems with respiration noted  Rest of physical exam deferred due to type of encounter     Assessment and Plan:     1. Vasomotor symptoms due to menopause 2. Decreased libido Reassured by improvement of vasomotor symptoms. Explained that libido is more complex, and is multifactorial.  Recommended consultation with sex therapists covered by her plan, to see if they have any recommendations.  Do not recommend Addyi/flibarserin as this is FDA-approved for premenopasusal women currently and I am concerned about the fatigue/dizziness side effect that may be exacerbated with taking this at same time as her hormonal therapy (concomitant use can lead to high levels of flibanserin).  - estrogens-methylTEST (ESTRATEST) 1.25-2.5 MG tablet; Take 1 tablet by mouth daily.  Dispense: 30 tablet; Refill: 5  3. Chronic fatigue Advised to follow up with her PCP for detailed evaluation, to rule in/out other etiologies.      I discussed the assessment and treatment plan with the patient. The patient was provided an opportunity to ask questions and all were answered. The patient agreed with the plan and demonstrated an understanding of the instructions.   The patient was advised to call back or seek an in-person evaluation/go to the ED if the symptoms worsen or if the condition  fails to improve as anticipated.  I provided 25 minutes of face-to-face time during this encounter.   Verita Schneiders, MD [facility], [place]

## 2019-01-13 ENCOUNTER — Telehealth: Payer: Self-pay

## 2019-01-13 NOTE — Telephone Encounter (Signed)
Pending approval for Emgality 120 mg Key: [id number]  I will update once a decision has been made.

## 2019-01-15 NOTE — Telephone Encounter (Signed)
Case number [id number], requesting authorization of Emgality 120mg /ml, use as directed (1 per month), has been cancelled for the following reason: Refill too soon. Please have the pharmacy refill on or after 01/01/2021Reviewed by: DMIT, RN

## 2019-01-22 ENCOUNTER — Other Ambulatory Visit: Payer: Self-pay

## 2019-01-22 ENCOUNTER — Other Ambulatory Visit (HOSPITAL_COMMUNITY): Payer: Self-pay | Admitting: Neurosurgery

## 2019-01-22 ENCOUNTER — Ambulatory Visit (HOSPITAL_COMMUNITY)
Admission: RE | Admit: 2019-01-22 | Discharge: 2019-01-22 | Disposition: A | Discharge status: Home / Self Care | Payer: 59 | Source: Ambulatory Visit | Attending: Neurosurgery | Admitting: Neurosurgery

## 2019-01-22 DIAGNOSIS — M7989 Other specified soft tissue disorders: Secondary | ICD-10-CM

## 2019-01-22 DIAGNOSIS — M79606 Pain in leg, unspecified: Secondary | ICD-10-CM

## 2019-01-22 NOTE — Progress Notes (Signed)
Lower extremity venous has been completed.   Preliminary results in [facility].   Abram Sander 01/22/2019 4:45 PM

## 2019-02-17 ENCOUNTER — Other Ambulatory Visit: Payer: Self-pay | Admitting: Neurology

## 2019-02-18 ENCOUNTER — Encounter: Payer: Self-pay | Admitting: Family Medicine

## 2019-02-20 ENCOUNTER — Telehealth: Payer: Self-pay

## 2019-02-20 NOTE — Telephone Encounter (Signed)
I have contacted the patient via mychart about this matter.

## 2019-02-20 NOTE — Telephone Encounter (Signed)
Pending approval for Emgality 120 mg Key: [id number] ICD 10 code: [id number] PA Case ID: [id number]   I will update once decision has been made.

## 2019-02-20 NOTE — Telephone Encounter (Signed)
Pt called wanting to know the update on this authorization. Please advise.

## 2019-02-24 NOTE — Telephone Encounter (Addendum)
Emgality has been approved through 02-20-2020. Patient has been made aware via mychart message.

## 2019-02-26 ENCOUNTER — Encounter: Payer: Self-pay | Admitting: Family Medicine

## 2019-03-21 ENCOUNTER — Encounter: Payer: Self-pay | Admitting: Family Medicine

## 2019-04-17 ENCOUNTER — Encounter: Payer: Self-pay | Admitting: Family Medicine

## 2019-04-17 ENCOUNTER — Other Ambulatory Visit: Payer: Self-pay | Admitting: *Deleted

## 2019-04-17 DIAGNOSIS — N951 Menopausal and female climacteric states: Secondary | ICD-10-CM

## 2019-04-17 MED ORDER — EST ESTROGENS-METHYLTEST 1.25-2.5 MG PO TABS: 1.0000 | ORAL_TABLET | Freq: Every day | ORAL | 5 refills | Status: DC

## 2019-04-17 MED ORDER — EST ESTROGENS-METHYLTEST 1.25-2.5 MG PO TABS: 1.0000 | ORAL_TABLET | Freq: Every day | ORAL | 3 refills | Status: DC

## 2019-04-17 NOTE — Progress Notes (Signed)
RX printed, changed and resent

## 2019-04-21 MED ORDER — RIZATRIPTAN BENZOATE 10 MG PO TABS: 10.0000 mg | ORAL_TABLET | ORAL | 1 refills | Status: DC | PRN

## 2019-04-30 MED ORDER — RIZATRIPTAN BENZOATE 10 MG PO TABS: 10.0000 mg | ORAL_TABLET | ORAL | 1 refills | Status: DC | PRN

## 2019-04-30 NOTE — Addendum Note (Signed)
Addended by: Brandon Melnick on: 04/30/2019 11:53 AM   Modules accepted: Orders

## 2019-07-30 ENCOUNTER — Other Ambulatory Visit: Payer: 59

## 2019-09-17 NOTE — Progress Notes (Addendum)
PATIENT: Marie Rios DOB: 1980-08-10  REASON FOR VISIT: follow up HISTORY FROM: patient  Chief Complaint  Patient presents with  . Follow-up    rm 1 here for  f/u on migraines.     HISTORY OF PRESENT ILLNESS: Today 09/18/19 Marie Rios is a 39 y.o. female here today for follow up for migraines and OSA.  She reports that she is doing fairly well today.  She does continue to have regular headaches.  She feels that she has some sort of mild headache almost every day.  She feels that Emgality has helped but is not working as well as it used to.  She has at least 4 migraines every month, sometimes more.  Rizatriptan does help to abort migraine.  She is using her CPAP every night.  She is doing very well with CPAP therapy and has been for the past year.  She was last seen by ophthalmology in December 2020.  Eye exam was normal.  She has a history of IIH.  She reports psychotic side effects with topiramate and Zonegran.  She is very hesitant to consider Diamox.  She denies any visual changes.  No blurred vision or vision loss.  She does occasionally see spots in her vision with a headache.  She continues to have concerns of excessive fatigue.  She admits that she does not drink enough water.  She does not exercise.  Her last child has just recently moved into college.  She is working full-time in the Systems [employer].  Compliance report dated 08/18/2019 through 09/16/2019 reveals that she used CPAP 29 of the last 30 days for compliance of 97%.  She is CPAP greater than 4 hours 29 of the past 30 days for compliance of 97%.  Average usage on days used was 7 hours and 12 minutes.  Residual AHI was 1.3 on 5 to 11 cm of water and an EPR of 3.  There was no leak noted in her mask.  HISTORY: (copied from my note on 09/24/2018)  Marie Rios is a 40 y.o. female here today for follow up of migraines. She reports that headaches occur nearly daily. Migraines are improved but continue to  occur regularly. She continues Emgality monthly. She has also started CPAP therapy with excellent compliance. She has not noticed much improvement since starting CPAP. She did have MRI that showed concerns of mildly enlarged sella turnica that could be incidental or related to IIH. She has never been treated in the past for IIH. She has taken topiramate and Zonegran in the past with reports of psychotic events and worsening anger. She reports last eye exam was about a year ago and normal. She denies ringing in ears, vision changes, or loss of vision. Headaches are tension type headaches and occur often in the morning hours.   HISTORY: (copied from my note on 04/08/2018)  Marie Rios a 39 y.o.femalehere today for follow up for migrainesand dizziness (chronic). She was seenon 3/2byPCPfor worsening migraines and an episode of what she feels was syncope. She reports that she fell and hit her head after standing to go to the kitchen. Her husband came to her aid after hearing her fall. She was sluggish following but no worrisome findings.No seizure like activity, incontinence, or biting tongue.She was given migraine cocktail of Torodol, Phenergan and Benadryl in office. On 3/4 she contacted PCP with reports of another syncopal episode(after standing up)and no improvement of headache. She was given steroid  burst. She did not feel that this helped her headache but did improve dizziness. She reports getting dizzy with standing or turning her head too fast. She is not drinking water regularly. She is taking Emgality monthly as prescribed. She uses Maxalt, Zofran and tizanidine as needed for abortive therapy. She reports that this combination usually helps but has not this past week. She admits that she does not drink much fluids throughout the day.  She is being evaluated by Dr Rexene Alberts for possible sleep apnea and has upcoming sleep study pending insurance approval.   She has questionable seizure  activity when angry or stressed. She reports last event was 06/2017.She has not taken Depakote in 2 years.  HISTORY: (copied from Dr Roseanna Rainbow on 01/15/2018)  Interval update for migraines (seziures may be non-epileptic events). Started Topiramatebut could not tolerate. Also started Ajovy (has latex allergycan't take aimovis) but insurance required [place] to try Emgality which was approved. Emgalityishelping with migraines. Sent in zofran for acute management. She was on Depakote in the past,seizure-like eventshappen when angry and stressed. MRI of the brain was ordered but not completed. CBC and CMP were normal.   Having nausea after dose of Emgality. Takes Zofran prior to dose and this helps. Has 5 headaches per month/ 3-4 migraines per month. This is a 50% improvement.  Couldn't tolerate Trokendi nor Zonegran due to anger and "psychotic episodes."   No seizure like activity since starting Emgality. Last episode was 06/2017.   Still snoring but does not feel it is as bad. 1-2 mornings she wakes up with headaches. She wakes daily with dry mouth. Having daytime sleepiness. Was advised sleep study  Interval history 09/19/2017: Has not seen patient in approx 2 years. Migraines worsened last year in the setting of stress. She has not taken Depakote in 4 months, she was tired of it. She had reported seizure in June of this year, discussed no driving restrictions. She stopped taking her Depakote. Over the last year she has 15 headache days a month and 8 are migrainous. Migraines are She has light sensitivity, sounds sensitivity, ears ringing, dizziness, vertigo, locations in the frontal area occ in the right occipital. Feels stabbing, throbbing. It can last up to 2 days. Unilateral migraines, pulsating and pounding and throbbing, She gets nausea no vomiting, She has light sensitivity, sounds sensitivity, ears ringing, dizziness, vertigo, locations in the frontal area occ in the right occipital.  Feels stabbing, throbbing. It can last up to 2 days and be severe. She gets nausea no vomiting. She has untreated sleep apnea. She takes excedrin every day. She has positional headaches and also vision blurriness and vision changes. Untreated sleep apnea with severe sleep apnea.  - requested records from previous sleep doctor, pending  - reviewed sleep report and data, AHI in 2011 to a max of 121 in REM sleep, was titrated on 10cm in 2011  Interval history 12/01/2015: Patient is doing well. One migraine a month. Tolerating Depakote well. No more events of alteration of consciousness. The maxalt works with the zofran, she has not tried the [place]. It takes several hours for the maxalt to work, she has to take it twice. Suggest taking the maxalt with the zofran and cambia. She had hysterectomy.   ZTI:[id number] H Rios a 39 y.o.femalehere as a referral from Dr. Joana Reamer Migraines and seizures in the setting of stress and anxiety.migraines Started 10 years ago. She has the migraines twice a month. She has light sensitivity, sounds sensitivity, ears ringing, dizziness, vertigo,  locations in the frontal area occ in the right occipital. Feels stabbing, throbbing. It can last up to 2 days. She gets nausea no vomiting. She has diarrhea sometimes. She is on Depakote but just recently restarted, she was successfully treated with Depakote in the past but was feeling better and stopped it. She has had a hysterectomy. She has been on Depakote off and on for 20 years. Also on Dilantin. She has a history of seizures as well. The seizures always happen when she gets stressed or with anger or anxiety , she passes out and jerks and shakes and wakes up confused. Workup has been negative. EEGs have been normal. Last time she had a seizure was on June 6th. She was seeing a neurologist. She was on Topiramate 200mg  twice a day. She was seeing a doctor in [place] and the Topiramate was too high. She was angry earlier  this month, she was frstrated, she "passed out". She says she got dizzy, vertigo, she came to with a cloth over her head and something cold over her neck. It lasted one minute. Husband says she will jerk than shake back and forth. Only when she stresses. This was the first one in a year. The seizures are always with stress or emotion that triggers it. She was on Depakote twice a day for many years. No other focal neurologic deficits or complaints.  Reviewed notes, labs and imaging from outside physicians, which showed: Patient was seen at [facility] for evaluation of vertigo, symptoms started in July 2012 with elevated Dilantin levels in the emergency room. Stress exacerbate her symptoms. She has participated and vestibular rehabilitation. The rehabilitation and improved her dizziness been no change in nausea. She reported associated seizures and migraines and associated sensitivity to light. On examination tests of ocular motor function revealed normal saccades, pursuit and optokinetic eye movements. There was no spontaneous nystagmus or gaze evoked nystagmus, rotary chair testing revealed normal vestibular ocular reflex gain, phase and symmetry at all frequencies testing indicating affective vestibular control. Dix-Hallpike's tests were negative for posterior semicircular canal BPPV, roll tests were negative for horizontal semicircular canal BPPV, caloric stimulation showed symmetric vestibular responses. Diagnosis was normal peripheral and central vestibular functions.   REVIEW OF SYSTEMS: Out of a complete 14 system review of symptoms, the patient complains only of the following symptoms, headaches, fatigue and all other reviewed systems are negative.  ALLERGIES: Allergies  Allergen Reactions  . Gabapentin     Intolerant.   . Keflex [Cephalexin]     Reflux  . Latex Other (See Comments)    Rash  . Ciprofloxacin Rash    HOME MEDICATIONS: Outpatient Medications Prior to Visit  Medication Sig Dispense  Refill  . azelastine (ASTELIN) 0.1 % nasal spray Place 2 sprays into both nostrils 2 (two) times daily. Use in each nostril as directed 30 mL 0  . Bismuth Subsalicylate (PEPTO-BISMOL PO) Take 1 tablet by mouth as needed.    Marland Kitchen EMGALITY 120 MG/ML SOAJ INJECT 120 MG INTO THE SKIN EVERY 30 (THIRTY) DAYS. 1 mL 10  . estrogens-methylTEST (ESTRATEST) 1.25-2.5 MG tablet Take 1 tablet by mouth daily. 30 tablet 5  . ondansetron (ZOFRAN-ODT) 4 MG disintegrating tablet Take 1 tablet (4 mg total) by mouth every 8 (eight) hours as needed for nausea or vomiting. 20 tablet 11  . rizatriptan (MAXALT) 10 MG tablet Take 1 tablet (10 mg total) by mouth as needed for migraine. May repeat in 2 hours if needed. Max 2 doses in 24  hours. Appointment needed. 10 tablet 1  . Ubrogepant (UBRELVY) 50 MG TABS Take 50 mg by mouth every 2 (two) hours as needed (migraine). Max of 100 mg (2 tablets) in 24 hours. 10 tablet 1   No facility-administered medications prior to visit.    PAST MEDICAL HISTORY: Past Medical History:  Diagnosis Date  . Anxiety   . Asthma   . Breast cyst, left 2019  . Depression   . Fibromyalgia   . History of stomach ulcers   . Lumbar herniated disc   . Migraine   . Seizures ([place])     PAST SURGICAL HISTORY: Past Surgical History:  Procedure Laterality Date  . APPENDECTOMY  1996  . CHOLECYSTECTOMY  2003  . LAPAROSCOPIC OOPHERECTOMY Right 2006  . LAPAROSCOPIC OOPHERECTOMY Left 2007  . pevlic surgery  1610   Plevic mass  . TUBAL LIGATION  2003  . VAGINAL HYSTERECTOMY  2005    FAMILY HISTORY: Family History  Problem Relation Age of Onset  . Heart attack Father        nov 2018  . Diabetes Other   . Cancer Other   . Heart disease Other   . Stroke Other   . Anemia Other   . Fibromyalgia Other   . Seizures Neg Hx   . Migraines Neg Hx     SOCIAL HISTORY: Social History   Socioeconomic History  . Marital status: Married    Spouse name: Aaron Edelman  . Number of children: 3  . Years  of education: 9  . Highest education level: Not on file  Occupational History  . Occupation: [employer]    Comment: left dec 2018  . 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 Smoking status: Former Smoker    Packs/day: 0.50    Quit date: 2018    Years since quitting: 3.6  . Smokeless tobacco: Never Used  . Tobacco comment: black & mild  Vaping Use  . Vaping Use: Former  Substance and Sexual Activity  . Alcohol use: Not Currently    Comment: was social, no longer drinking   . Drug use: Not Currently    Comment: Marijuana- ocassionally (07/21/15)  . Sexual activity: Not on file  Other Topics Concern  . Not on file  Social History Narrative   Lives: with spouse and 2 of her children    Caffeine use: 1 cup of tea daily   Right handed   Social Determinants of Health   Financial Resource Strain:   . Difficulty of Paying Living Expenses: Not on file  Food Insecurity:   . Worried About Charity fundraiser in the Last Year: Not on file  . Ran Out of Food in the Last Year: Not on file  Transportation Needs:   . Lack of Transportation (Medical): Not on file  . Lack of Transportation (Non-Medical): Not on file  Physical Activity:   . Days of Exercise per Week: Not on file  . Minutes of Exercise per Session: Not on file  Stress:   . Feeling of Stress : Not on file  Social Connections:   . Frequency of Communication with Friends and Family: Not on file  . Frequency of Social Gatherings with Friends and Family: Not on file  . Attends Religious Services: Not on file  . Active Member of Clubs or Organizations: Not on file  . Attends Archivist Meetings:  Not on file  . Marital Status: Not on file  Intimate Partner Violence:   . Fear of Current or Ex-Partner: Not on file  . Emotionally Abused: Not on file  . Physically Abused: Not on file  . Sexually Abused: Not on file      PHYSICAL EXAM  Vitals:     09/18/19 1532  BP: 109/69  Pulse: 73  Weight: 209 lb (94.8 kg)  Height: 5\' 4"  (1.626 m)   Body mass index is 35.87 kg/m.  Generalized: Well developed, in no acute distress  Cardiology: normal rate and rhythm, no murmur noted Respiratory: clear to auscultation bilaterally  Neurological examination  Mentation: Alert oriented to time, place, history taking. Follows all commands speech and language fluent Cranial nerve II-XII: Pupils were equal round reactive to light. Extraocular movements were full, visual field were full  Motor: The motor testing reveals 5 over 5 strength of all 4 extremities. Good symmetric motor tone is noted throughout.  Sensory: Sensory testing is intact to soft touch on all 4 extremities. No evidence of extinction is noted.  Coordination: Cerebellar testing reveals good finger-nose-finger and heel-to-shin bilaterally.  Gait and station: Gait is normal.   DIAGNOSTIC DATA (LABS, IMAGING, TESTING) - I reviewed patient records, labs, notes, testing and imaging myself where available.  No flowsheet data found.   Lab Results  Component Value Date   WBC 4.8 09/19/2017   HGB 12.7 09/19/2017   HCT 39.1 09/19/2017   MCV 82 09/19/2017   PLT 217 09/19/2017      Component Value Date/Time   NA 143 09/19/2017 0943   K 4.4 09/19/2017 0943   CL 105 09/19/2017 0943   CO2 24 09/19/2017 0943   GLUCOSE 80 09/19/2017 0943   BUN 10 09/19/2017 0943   CREATININE 0.66 09/19/2017 0943   CALCIUM 9.2 09/19/2017 0943   PROT 7.2 09/19/2017 0943   ALBUMIN 4.3 09/19/2017 0943   AST 17 09/19/2017 0943   ALT 15 09/19/2017 0943   ALKPHOS 81 09/19/2017 0943   BILITOT 0.4 09/19/2017 0943   GFRNONAA 113 09/19/2017 0943   GFRAA 131 09/19/2017 0943   No results found for: CHOL, HDL, LDLCALC, LDLDIRECT, TRIG, CHOLHDL No results found for: HGBA1C No results found for: VITAMINB12 No results found for: TSH     ASSESSMENT AND PLAN 39 y.o. year old female  has a past medical  history of Anxiety, Asthma, Breast cyst, left (2019), Depression, Fibromyalgia, History of stomach ulcers, Lumbar herniated disc, Migraine, and Seizures ([place]). here with     ICD-10-CM   1. Migraine without aura and without status migrainosus, not intractable  G43.009 Fremanezumab-vfrm (AJOVY) 225 MG/1.5ML SOAJ    DISCONTINUED: Fremanezumab-vfrm (AJOVY) 225 MG/1.5ML SOAJ  2. OSA (obstructive sleep apnea)  G47.33 For home use only DME continuous positive airway pressure (CPAP)  3. Chronic fatigue  R53.82 Vitamin B12    Vitamin D, 25-hydroxy    Shelton Silvas feels that, overall, she is doing fairly well.  She does wish that we can get a little better control of her headaches.  Emgality has seemed to help a little bit over the past year.  She would like to try Ajovy.  We will stop Emgality and try Ajovy every 30 days.  She will continue rizatriptan for abortive therapy.  We have discussed her concerns of chronic fatigue.  I will check her vitamin B12 and vitamin D levels per her request.  She reports taking an over-the-counter B12 supplement and felt better but she  has since discontinued the supplement.  We have discussed healthy lifestyle habits.  I have encouraged her to increase her water intake.  I have also encouraged her to incorporate some sort of exercise every day.  Stress management reviewed.  She will continue to use CPAP nightly and for greater than 4 hours each night.  I have recommended a 6 to 77-month follow-up, pending response to Ajovy.  She will follow-up with her ophthalmologist in December.  She will let me know of any new vision changes.  She verbalizes understanding and agreement with this plan.   Orders Placed This Encounter  Procedures  . For home use only DME continuous positive airway pressure (CPAP)    Supplies    Order Specific Question:   Length of Need    Answer:   Lifetime    Order Specific Question:   Patient has OSA or probable OSA    Answer:   Yes    Order Specific  Question:   Is the patient currently using CPAP in the home    Answer:   Yes    Order Specific Question:   Settings    Answer:   Other see comments    Order Specific Question:   CPAP supplies needed    Answer:   Mask, headgear, cushions, filters, heated tubing and water chamber  . Vitamin B12  . Vitamin D, 25-hydroxy     Meds ordered this encounter  Medications  . DISCONTD: Fremanezumab-vfrm (AJOVY) 225 MG/1.5ML SOAJ    Sig: Inject 225 mg into the skin every 30 (thirty) days.    Dispense:  1.5 mL    Refill:  11    Order Specific Question:   Supervising Provider    Answer:   Melvenia Beam [id number]  . DISCONTD: rizatriptan (MAXALT) 10 MG tablet    Sig: Take 1 tablet (10 mg total) by mouth as needed for migraine. May repeat in 2 hours if needed. Max 2 doses in 24 hours. Appointment needed.    Dispense:  10 tablet    Refill:  1    Have pt call to schedule follow up.    Order Specific Question:   Supervising Provider    Answer:   Melvenia Beam [id number]  . Fremanezumab-vfrm (AJOVY) 225 MG/1.5ML SOAJ    Sig: Inject 225 mg into the skin every 30 (thirty) days.    Dispense:  1.5 mL    Refill:  11    Order Specific Question:   Supervising Provider    Answer:   Melvenia Beam [id number]  . rizatriptan (MAXALT) 10 MG tablet    Sig: Take 1 tablet (10 mg total) by mouth as needed for migraine. May repeat in 2 hours if needed. Max 2 doses in 24 hours. Appointment needed.    Dispense:  10 tablet    Refill:  1    Have pt call to schedule follow up.    Order Specific Question:   Supervising Provider    Answer:   Melvenia Beam [[id number]]      I spent 30 minutes with the patient. 50% of this time was spent counseling and educating patient on plan of care and medications.     Debbora Presto, FNP-C 09/18/2019, 3:57 PM [facility] [address] [place], [place] [address] [phone]  Made any corrections needed, and agree with history, physical, neuro  exam,assessment and plan as stated.     Sarina Ill, MD [facility]

## 2019-09-18 ENCOUNTER — Telehealth: Payer: Self-pay | Admitting: Family Medicine

## 2019-09-18 ENCOUNTER — Other Ambulatory Visit: Payer: Self-pay | Admitting: Obstetrics & Gynecology

## 2019-09-18 ENCOUNTER — Encounter: Payer: Self-pay | Admitting: Family Medicine

## 2019-09-18 ENCOUNTER — Ambulatory Visit: Payer: 59 | Admitting: Family Medicine

## 2019-09-18 ENCOUNTER — Other Ambulatory Visit: Payer: Self-pay | Admitting: Family Medicine

## 2019-09-18 VITALS — BP 109/69 | HR 73 | Ht 64.0 in | Wt 209.0 lb

## 2019-09-18 DIAGNOSIS — R5382 Chronic fatigue, unspecified: Secondary | ICD-10-CM | POA: Diagnosis not present

## 2019-09-18 DIAGNOSIS — G43009 Migraine without aura, not intractable, without status migrainosus: Secondary | ICD-10-CM

## 2019-09-18 DIAGNOSIS — G4733 Obstructive sleep apnea (adult) (pediatric): Secondary | ICD-10-CM | POA: Diagnosis not present

## 2019-09-18 DIAGNOSIS — G43711 Chronic migraine without aura, intractable, with status migrainosus: Secondary | ICD-10-CM

## 2019-09-18 DIAGNOSIS — Z1231 Encounter for screening mammogram for malignant neoplasm of breast: Secondary | ICD-10-CM

## 2019-09-18 MED ORDER — RIZATRIPTAN BENZOATE 10 MG PO TABS: 10.0000 mg | ORAL_TABLET | ORAL | 1 refills | Status: DC | PRN

## 2019-09-18 MED ORDER — AJOVY 225 MG/1.5ML ~~LOC~~ SOAJ: 225.0000 mg | SUBCUTANEOUS | 11 refills | Status: DC

## 2019-09-18 NOTE — Telephone Encounter (Signed)
Received PA request for Ajovy. PA was submitted via [email or web address]. Key is [id number]. Per CMM, PA will be approved with 72 hrs.

## 2019-09-18 NOTE — Patient Instructions (Signed)
You have been on Emgality for over a year. Let's try Ajovy. Continue rizatriptan for abortive therapy.   We will check b12 and vitamin d per your request   Try to increase water intake. I recommend 50-60 ounces. Try to add in some exercise daily.   Follow up with ophthalmology in 12/2018. Follow up with me in 6-12 months   Migraine Headache A migraine headache is a very strong throbbing pain on one side or both sides of your head. This type of headache can also cause other symptoms. It can last from 4 hours to 3 days. Talk with your doctor about what things may bring on (trigger) this condition. What are the causes? The exact cause of this condition is not known. This condition may be triggered or caused by:  Drinking alcohol.  Smoking.  Taking medicines, such as: ? Medicine used to treat chest pain (nitroglycerin). ? Birth control pills. ? Estrogen. ? Some blood pressure medicines.  Eating or drinking certain products.  Doing physical activity. Other things that may trigger a migraine headache include:  Having a menstrual period.  Pregnancy.  Hunger.  Stress.  Not getting enough sleep or getting too much sleep.  Weather changes.  Tiredness (fatigue). What increases the risk?  Being 71-77 years old.  Being female.  Having a family history of migraine headaches.  Being Caucasian.  Having depression or anxiety.  Being very overweight. What are the signs or symptoms?  A throbbing pain. This pain may: ? Happen in any area of the head, such as on one side or both sides. ? Make it hard to do daily activities. ? Get worse with physical activity. ? Get worse around bright lights or loud noises.  Other symptoms may include: ? Feeling sick to your stomach (nauseous). ? Vomiting. ? Dizziness. ? Being sensitive to bright lights, loud noises, or smells.  Before you get a migraine headache, you may get warning signs (an aura). An aura may include: ? Seeing  flashing lights or having blind spots. ? Seeing bright spots, halos, or zigzag lines. ? Having tunnel vision or blurred vision. ? Having numbness or a tingling feeling. ? Having trouble talking. ? Having weak muscles.  Some people have symptoms after a migraine headache (postdromal phase), such as: ? Tiredness. ? Trouble thinking (concentrating). How is this treated?  Taking medicines that: ? Relieve pain. ? Relieve the feeling of being sick to your stomach. ? Prevent migraine headaches.  Treatment may also include: ? Having acupuncture. ? Avoiding foods that bring on migraine headaches. ? Learning ways to control your body functions (biofeedback). ? Therapy to help you know and deal with negative thoughts (cognitive behavioral therapy). Follow these instructions at home: Medicines  Take over-the-counter and prescription medicines only as told by your doctor.  Ask your doctor if the medicine prescribed to you: ? Requires you to avoid driving or using heavy machinery. ? Can cause trouble pooping (constipation). You may need to take these steps to prevent or treat trouble pooping:  Drink enough fluid to keep your pee (urine) pale yellow.  Take over-the-counter or prescription medicines.  Eat foods that are high in fiber. These include beans, whole grains, and fresh fruits and vegetables.  Limit foods that are high in fat and sugar. These include fried or sweet foods. Lifestyle  Do not drink alcohol.  Do not use any products that contain nicotine or tobacco, such as cigarettes, e-cigarettes, and chewing tobacco. If you need help quitting, ask your  doctor.  Get at least 8 hours of sleep every night.  Limit and deal with stress. General instructions      Keep a journal to find out what may bring on your migraine headaches. For example, write down: ? What you eat and drink. ? How much sleep you get. ? Any change in what you eat or drink. ? Any change in your  medicines.  If you have a migraine headache: ? Avoid things that make your symptoms worse, such as bright lights. ? It may help to lie down in a dark, quiet room. ? Do not drive or use heavy machinery. ? Ask your doctor what activities are safe for you.  Keep all follow-up visits as told by your doctor. This is important. Contact a doctor if:  You get a migraine headache that is different or worse than others you have had.  You have more than 15 headache days in one month. Get help right away if:  Your migraine headache gets very bad.  Your migraine headache lasts longer than 72 hours.  You have a fever.  You have a stiff neck.  You have trouble seeing.  Your muscles feel weak or like you cannot control them.  You start to lose your balance a lot.  You start to have trouble walking.  You pass out (faint).  You have a seizure. Summary  A migraine headache is a very strong throbbing pain on one side or both sides of your head. These headaches can also cause other symptoms.  This condition may be treated with medicines and changes to your lifestyle.  Keep a journal to find out what may bring on your migraine headaches.  Contact a doctor if you get a migraine headache that is different or worse than others you have had.  Contact your doctor if you have more than 15 headache days in a month. This information is not intended to replace advice given to you by your health care provider. Make sure you discuss any questions you have with your health care provider. Document Revised: 05/10/2018 Document Reviewed: 02/28/2018 Elsevier Patient Education  [place].

## 2019-09-19 LAB — VITAMIN B12: Vitamin B-12: 761 pg/mL (ref 232–1245)

## 2019-09-19 LAB — VITAMIN D 25 HYDROXY (VIT D DEFICIENCY, FRACTURES): Vit D, 25-Hydroxy: 8.1 ng/mL — ABNORMAL LOW (ref 30.0–100.0)

## 2019-09-22 ENCOUNTER — Other Ambulatory Visit: Payer: Self-pay | Admitting: Family Medicine

## 2019-09-22 ENCOUNTER — Encounter: Payer: Self-pay | Admitting: Family Medicine

## 2019-09-22 MED ORDER — VITAMIN D (ERGOCALCIFEROL) 1.25 MG (50000 UNIT) PO CAPS: 50000.0000 [IU] | ORAL_CAPSULE | ORAL | 0 refills | Status: DC

## 2019-09-23 ENCOUNTER — Telehealth: Payer: Self-pay | Admitting: *Deleted

## 2019-09-23 NOTE — Telephone Encounter (Signed)
I have spoken with the patient. She verbalized understanding of the lab findings. She will start the prescribed supplement and follow up with her PCP.

## 2019-09-23 NOTE — Telephone Encounter (Signed)
-----   Message from Debbora Presto, NP sent at 09/22/2019 12:48 PM EDT ----- Please let her know that her B12 was normal. Her vitamin D is very low. I am calling in vitamin D rx. She will take 1 capsule every week for 12 weeks. I would like her to have PCP follow up and recheck labs in 3 months.

## 2019-09-24 MED ORDER — EMGALITY 120 MG/ML ~~LOC~~ SOAJ: SUBCUTANEOUS | 11 refills | Status: DC

## 2019-09-24 NOTE — Telephone Encounter (Signed)
Ajovy has been DENIED.  This request was denied because you did not meet the following clinical requirements:  The requested medication and/or diagnosis are not a covered benefit and excluded from coverage in accordance with the terms and conditions of your plan benefit. Therefore, the request has been administratively denied.  The requested medication and/or diagnosis are not a covered benefit and are excluded from coverage in accordance with the terms and conditions of your plan benefit. Therefore, this request has been administratively denied. Reviewed by: [id number], RPh  Appeals: Standard Fax: [phone]     Expedited Fax: [phone]

## 2019-09-24 NOTE — Telephone Encounter (Signed)
I attempted to contact the patient twice. No answer. Message picked up stating the wireless caller is not available and to try again later. No option to leave voicemail.   I called [facility] at [phone] and spoke to [place]. She checked on the Ajovy rx. The Access Card would not work. She will need to continue the Emgality. Refills sent in for the patient.  They will provide this information to the patient. If she calls our office back, we can also let her know.

## 2019-09-24 NOTE — Addendum Note (Signed)
Addended by: Noberto Retort C on: 09/24/2019 11:47 AM   Modules accepted: Orders

## 2019-09-24 NOTE — Telephone Encounter (Signed)
We can let patient know that Marie Rios has been denied. She can try access card to see if it will work. If not, we may have to continue Emgality.

## 2019-10-15 ENCOUNTER — Encounter: Payer: Self-pay | Admitting: Family Medicine

## 2019-10-15 NOTE — Telephone Encounter (Signed)
From our note: Sharyn Lull RN:  09-18-19 I attempted to contact the patient twice. No answer. Message picked up stating the wireless caller is not available and to try again later. No option to leave voicemail.   I called [facility] at [phone] and spoke to [place]. She checked on the Ajovy rx. The Access Card would not work. She will need to continue the Emgality. Refills sent in for the patient.  They will provide this information to the patient. If she calls our office back, we can also let her know.

## 2019-10-31 ENCOUNTER — Other Ambulatory Visit: Payer: Self-pay

## 2019-10-31 ENCOUNTER — Ambulatory Visit (INDEPENDENT_AMBULATORY_CARE_PROVIDER_SITE_OTHER): Payer: 59 | Admitting: Family Medicine

## 2019-10-31 ENCOUNTER — Encounter: Payer: Self-pay | Admitting: Family Medicine

## 2019-10-31 VITALS — BP 110/70 | HR 78 | Temp 98.5°F | Ht 64.0 in | Wt 229.5 lb

## 2019-10-31 DIAGNOSIS — R5383 Other fatigue: Secondary | ICD-10-CM

## 2019-10-31 DIAGNOSIS — Z1159 Encounter for screening for other viral diseases: Secondary | ICD-10-CM | POA: Diagnosis not present

## 2019-10-31 DIAGNOSIS — Z0000 Encounter for general adult medical examination without abnormal findings: Secondary | ICD-10-CM

## 2019-10-31 NOTE — Patient Instructions (Addendum)
Give us 2-3 business days to get the results of your labs back.   Keep the diet clean and stay active.  Let us know if you need anything. 

## 2019-10-31 NOTE — Progress Notes (Signed)
Chief Complaint  Patient presents with  . Annual Exam     Well Woman Marie Rios is here for a complete physical.   Her last physical was >1 year ago.  Current diet: in general, a "poor" diet. Current exercise: some walking. Fatigue out of ordinary? Yes; low Vit D tx Seatbelt? Yes   Health Maintenance Pap/HPV- Yes Tetanus- Yes HIV screening- Yes Hep C screening- No   Past Medical History:  Diagnosis Date  . Anxiety   . Asthma   . Breast cyst, left 2019  . Depression   . Fibromyalgia   . History of stomach ulcers   . Lumbar herniated disc   . Migraine   . Seizures ([facility])     Past Surgical History:  Procedure Laterality Date  . APPENDECTOMY  1996  . CHOLECYSTECTOMY  2003  . LAPAROSCOPIC OOPHERECTOMY Right 2006  . LAPAROSCOPIC OOPHERECTOMY Left 2007  . pevlic surgery  6195   Plevic mass  . TUBAL LIGATION  2003  . VAGINAL HYSTERECTOMY  2005     Medications  Current Outpatient Medications on File Prior to Visit  Medication Sig Dispense Refill  . Bismuth Subsalicylate (PEPTO-BISMOL PO) Take 1 tablet by mouth as needed.    Marland Kitchen estrogens-methylTEST (ESTRATEST) 1.25-2.5 MG tablet Take 1 tablet by mouth daily. 30 tablet 5  . Galcanezumab-gnlm (EMGALITY) 120 MG/ML SOAJ INJECT 120 MG INTO THE SKIN EVERY 30 (THIRTY) DAYS. 1 mL 11  . ondansetron (ZOFRAN-ODT) 4 MG disintegrating tablet Take 1 tablet (4 mg total) by mouth every 8 (eight) hours as needed for nausea or vomiting. 20 tablet 11  . rizatriptan (MAXALT) 10 MG tablet Take 1 tablet (10 mg total) by mouth as needed for migraine. May repeat in 2 hours if needed. Max 2 doses in 24 hours. Appointment needed. 10 tablet 1  . Vitamin D, Ergocalciferol, (DRISDOL) 1.25 MG (50000 UNIT) CAPS capsule Take 1 capsule (50,000 Units total) by mouth every 7 (seven) days. 12 capsule 0   Allergies Allergies  Allergen Reactions  . Gabapentin     Intolerant.   . Keflex [Cephalexin]     Reflux  . Latex Other (See Comments)    Rash   . Ciprofloxacin Rash    Review of Systems: Constitutional:  no unexpected weight changes Eye:  no recent significant change in vision Ear/Nose/Mouth/Throat:  Ears:  no tinnitus or vertigo and no recent change in hearing Nose/Mouth/Throat:  no complaints of nasal congestion, no sore throat Cardiovascular: no chest pain Respiratory:  no cough and no shortness of breath Gastrointestinal:  no abdominal pain, no change in bowel habits GU:  Female: negative for dysuria or pelvic pain Musculoskeletal/Extremities:  no pain of the joints Integumentary (Skin/Breast):  no abnormal skin lesions reported Neurologic:  no headaches Endocrine:  denies fatigue Hematologic/Lymphatic:  No areas of easy bleeding  Exam BP 110/70 (BP Location: Left Arm, Patient Position: Sitting, Cuff Size: Large)   Pulse 78   Temp 98.5 F (36.9 C) (Oral)   Ht 5\' 4"  (1.626 m)   Wt 229 lb 8 oz (104.1 kg)   SpO2 97%   BMI 39.39 kg/m  General:  well developed, well nourished, in no apparent distress Skin:  no significant moles, warts, or growths Head:  no masses, lesions, or tenderness Eyes:  pupils equal and round, sclera anicteric without injection Ears:  canals without lesions, TMs shiny without retraction, no obvious effusion, no erythema Nose:  nares patent, septum midline, mucosa normal, and no drainage or  sinus tenderness Throat/Pharynx:  lips and gingiva without lesion; tongue and uvula midline; non-inflamed pharynx; no exudates or postnasal drainage Neck: neck supple without adenopathy, thyromegaly, or masses Lungs:  clear to auscultation, breath sounds equal bilaterally, no respiratory distress Cardio:  regular rate and rhythm, no bruits, no LE edema Abdomen:  abdomen soft, nontender; bowel sounds normal; no masses or organomegaly Genital: Defer to GYN Musculoskeletal:  symmetrical muscle groups noted without atrophy or deformity Extremities:  no clubbing, cyanosis, or edema, no deformities, no skin  discoloration Neuro:  gait normal; deep tendon reflexes normal and symmetric Psych: well oriented with normal range of affect and appropriate judgment/insight  Assessment and Plan  Well adult exam - Plan: Comprehensive metabolic panel, Lipid panel  Fatigue, unspecified type - Plan: CBC  Encounter for hepatitis C screening test for low risk patient - Plan: Hepatitis C antibody   Well 39 y.o. female. Counseled on diet and exercise. Other orders as above. Getting flu shot at work.  Follow up in 1 yr or prn. The patient voiced understanding and agreement to the plan.  [place], DO 10/31/19 3:00 PM

## 2019-11-03 LAB — CBC
HCT: 43.1 % (ref 35.0–45.0)
Hemoglobin: 14.4 g/dL (ref 11.7–15.5)
MCH: 28.4 pg (ref 27.0–33.0)
MCHC: 33.4 g/dL (ref 32.0–36.0)
MCV: 85 fL (ref 80.0–100.0)
MPV: 13.7 fL — ABNORMAL HIGH (ref 7.5–12.5)
Platelets: 219 10*3/uL (ref 140–400)
RBC: 5.07 10*6/uL (ref 3.80–5.10)
RDW: 13.1 % (ref 11.0–15.0)
WBC: 6.1 10*3/uL (ref 3.8–10.8)

## 2019-11-03 LAB — COMPREHENSIVE METABOLIC PANEL
AG Ratio: 1.5 (calc) (ref 1.0–2.5)
ALT: 18 U/L (ref 6–29)
AST: 17 U/L (ref 10–30)
Albumin: 4.5 g/dL (ref 3.6–5.1)
Alkaline phosphatase (APISO): 73 U/L (ref 31–125)
BUN: 10 mg/dL (ref 7–25)
CO2: 25 mmol/L (ref 20–32)
Calcium: 9.1 mg/dL (ref 8.6–10.2)
Chloride: 103 mmol/L (ref 98–110)
Creat: 0.73 mg/dL (ref 0.50–1.10)
Globulin: 3.1 g/dL (calc) (ref 1.9–3.7)
Glucose, Bld: 80 mg/dL (ref 65–99)
Potassium: 4.2 mmol/L (ref 3.5–5.3)
Sodium: 139 mmol/L (ref 135–146)
Total Bilirubin: 0.8 mg/dL (ref 0.2–1.2)
Total Protein: 7.6 g/dL (ref 6.1–8.1)

## 2019-11-03 LAB — LIPID PANEL
Cholesterol: 166 mg/dL (ref <200)
HDL: 35 mg/dL — ABNORMAL LOW (ref >=50)
LDL Cholesterol (Calc): 116 mg/dL (calc) — ABNORMAL HIGH
Non-HDL Cholesterol (Calc): 131 mg/dL (calc) — ABNORMAL HIGH (ref <130)
Total CHOL/HDL Ratio: 4.7 (calc) (ref <5.0)
Triglycerides: 48 mg/dL (ref <150)

## 2019-11-03 LAB — HEPATITIS C ANTIBODY
Hepatitis C Ab: NONREACTIVE
SIGNAL TO CUT-OFF: 0.01 (ref <1.00)

## 2019-11-07 ENCOUNTER — Ambulatory Visit: Payer: 59

## 2019-11-17 ENCOUNTER — Other Ambulatory Visit: Payer: Self-pay | Admitting: Obstetrics & Gynecology

## 2019-11-17 DIAGNOSIS — N951 Menopausal and female climacteric states: Secondary | ICD-10-CM

## 2019-11-18 MED ORDER — EST ESTROGENS-METHYLTEST 1.25-2.5 MG PO TABS: 1.0000 | ORAL_TABLET | Freq: Every day | ORAL | 4 refills | Status: DC

## 2019-11-24 ENCOUNTER — Other Ambulatory Visit: Payer: Self-pay | Admitting: Neurology

## 2019-12-07 ENCOUNTER — Encounter: Payer: Self-pay | Admitting: *Deleted

## 2019-12-07 ENCOUNTER — Emergency Department: Payer: 59

## 2019-12-07 ENCOUNTER — Other Ambulatory Visit: Payer: Self-pay

## 2019-12-07 ENCOUNTER — Inpatient Hospital Stay
Admission: EM | Admit: 2019-12-07 | Discharge: 2019-12-11 | DRG: 202 | Disposition: A | Discharge status: Home / Self Care | Payer: 59 | Attending: Internal Medicine | Admitting: Internal Medicine

## 2019-12-07 DIAGNOSIS — J9600 Acute respiratory failure, unspecified whether with hypoxia or hypercapnia: Secondary | ICD-10-CM | POA: Diagnosis not present

## 2019-12-07 DIAGNOSIS — G4733 Obstructive sleep apnea (adult) (pediatric): Secondary | ICD-10-CM | POA: Diagnosis present

## 2019-12-07 DIAGNOSIS — F32A Depression, unspecified: Secondary | ICD-10-CM | POA: Diagnosis present

## 2019-12-07 DIAGNOSIS — J206 Acute bronchitis due to rhinovirus: Principal | ICD-10-CM | POA: Diagnosis present

## 2019-12-07 DIAGNOSIS — R0602 Shortness of breath: Secondary | ICD-10-CM | POA: Diagnosis present

## 2019-12-07 DIAGNOSIS — J9601 Acute respiratory failure with hypoxia: Secondary | ICD-10-CM

## 2019-12-07 DIAGNOSIS — Z833 Family history of diabetes mellitus: Secondary | ICD-10-CM

## 2019-12-07 DIAGNOSIS — Z91040 Latex allergy status: Secondary | ICD-10-CM

## 2019-12-07 DIAGNOSIS — Z90710 Acquired absence of both cervix and uterus: Secondary | ICD-10-CM

## 2019-12-07 DIAGNOSIS — Z823 Family history of stroke: Secondary | ICD-10-CM

## 2019-12-07 DIAGNOSIS — M797 Fibromyalgia: Secondary | ICD-10-CM | POA: Diagnosis present

## 2019-12-07 DIAGNOSIS — B9710 Unspecified enterovirus as the cause of diseases classified elsewhere: Secondary | ICD-10-CM | POA: Diagnosis present

## 2019-12-07 DIAGNOSIS — Z8711 Personal history of peptic ulcer disease: Secondary | ICD-10-CM

## 2019-12-07 DIAGNOSIS — Z79890 Hormone replacement therapy (postmenopausal): Secondary | ICD-10-CM

## 2019-12-07 DIAGNOSIS — E876 Hypokalemia: Secondary | ICD-10-CM | POA: Diagnosis present

## 2019-12-07 DIAGNOSIS — Z20822 Contact with and (suspected) exposure to covid-19: Secondary | ICD-10-CM | POA: Diagnosis present

## 2019-12-07 DIAGNOSIS — J9811 Atelectasis: Secondary | ICD-10-CM | POA: Diagnosis present

## 2019-12-07 DIAGNOSIS — Z888 Allergy status to other drugs, medicaments and biological substances status: Secondary | ICD-10-CM

## 2019-12-07 DIAGNOSIS — R0600 Dyspnea, unspecified: Secondary | ICD-10-CM

## 2019-12-07 DIAGNOSIS — G43909 Migraine, unspecified, not intractable, without status migrainosus: Secondary | ICD-10-CM | POA: Diagnosis present

## 2019-12-07 DIAGNOSIS — R0609 Other forms of dyspnea: Secondary | ICD-10-CM

## 2019-12-07 DIAGNOSIS — Z8249 Family history of ischemic heart disease and other diseases of the circulatory system: Secondary | ICD-10-CM

## 2019-12-07 DIAGNOSIS — Z79899 Other long term (current) drug therapy: Secondary | ICD-10-CM

## 2019-12-07 DIAGNOSIS — Z6839 Body mass index (BMI) 39.0-39.9, adult: Secondary | ICD-10-CM

## 2019-12-07 DIAGNOSIS — Z90722 Acquired absence of ovaries, bilateral: Secondary | ICD-10-CM

## 2019-12-07 DIAGNOSIS — F419 Anxiety disorder, unspecified: Secondary | ICD-10-CM | POA: Diagnosis present

## 2019-12-07 DIAGNOSIS — Z9049 Acquired absence of other specified parts of digestive tract: Secondary | ICD-10-CM

## 2019-12-07 DIAGNOSIS — J45901 Unspecified asthma with (acute) exacerbation: Secondary | ICD-10-CM | POA: Diagnosis present

## 2019-12-07 DIAGNOSIS — Z881 Allergy status to other antibiotic agents status: Secondary | ICD-10-CM

## 2019-12-07 DIAGNOSIS — Z87891 Personal history of nicotine dependence: Secondary | ICD-10-CM

## 2019-12-07 LAB — CBC
HCT: 40.6 % (ref 36.0–46.0)
Hemoglobin: 13.5 g/dL (ref 12.0–15.0)
MCH: 27.9 pg (ref 26.0–34.0)
MCHC: 33.3 g/dL (ref 30.0–36.0)
MCV: 83.9 fL (ref 80.0–100.0)
Platelets: 276 10*3/uL (ref 150–400)
RBC: 4.84 MIL/uL (ref 3.87–5.11)
RDW: 12.7 % (ref 11.5–15.5)
WBC: 9.7 10*3/uL (ref 4.0–10.5)
nRBC: 0 % (ref 0.0–0.2)

## 2019-12-07 LAB — BASIC METABOLIC PANEL
Anion gap: 11 (ref 5–15)
BUN: 13 mg/dL (ref 6–20)
CO2: 26 mmol/L (ref 22–32)
Calcium: 8.7 mg/dL — ABNORMAL LOW (ref 8.9–10.3)
Chloride: 101 mmol/L (ref 98–111)
Creatinine, Ser: 0.85 mg/dL (ref 0.44–1.00)
GFR, Estimated: >60 mL/min (ref >60)
Glucose, Bld: 105 mg/dL — ABNORMAL HIGH (ref 70–99)
Potassium: 3.3 mmol/L — ABNORMAL LOW (ref 3.5–5.1)
Sodium: 138 mmol/L (ref 135–145)

## 2019-12-07 LAB — TROPONIN I (HIGH SENSITIVITY): Troponin I (High Sensitivity): 3 ng/L (ref <18)

## 2019-12-07 MED ORDER — PREDNISONE 10 MG (21) PO TBPK: ORAL_TABLET | ORAL | 0 refills | Status: DC

## 2019-12-07 MED ORDER — ALBUTEROL SULFATE HFA 108 (90 BASE) MCG/ACT IN AERS: 2.0000 | INHALATION_SPRAY | Freq: Four times a day (QID) | RESPIRATORY_TRACT | 2 refills | Status: DC | PRN

## 2019-12-07 MED ORDER — IPRATROPIUM-ALBUTEROL 0.5-2.5 (3) MG/3ML IN SOLN
3.0000 mL | Freq: Once | RESPIRATORY_TRACT | Status: AC
  Administered 2019-12-07: 3 mL via RESPIRATORY_TRACT
  Filled 2019-12-07: qty 3

## 2019-12-07 MED ORDER — IOHEXOL 350 MG/ML SOLN
100.0000 mL | Freq: Once | INTRAVENOUS | Status: AC | PRN
  Administered 2019-12-07: 100 mL via INTRAVENOUS
  Filled 2019-12-07: qty 100

## 2019-12-07 MED ORDER — DEXAMETHASONE SODIUM PHOSPHATE 10 MG/ML IJ SOLN
10.0000 mg | Freq: Once | INTRAMUSCULAR | Status: AC
  Administered 2019-12-07: 10 mg via INTRAVENOUS
  Filled 2019-12-07: qty 1

## 2019-12-07 MED ORDER — AZITHROMYCIN 250 MG PO TABS: ORAL_TABLET | ORAL | 0 refills | Status: DC

## 2019-12-07 NOTE — ED Notes (Signed)
Patient reports short of breath and cough for approximately 1 week that got progressively worse.  Patient extremely short of breath with any type of exertion.

## 2019-12-07 NOTE — ED Notes (Signed)
Patient ambulated down hallway with pulse oxi monitoring, O2 sats drop to 89% on way back to treatment room.

## 2019-12-07 NOTE — ED Provider Notes (Signed)
[facility] Emergency Department Provider Note  ____________________________________________   First MD Initiated Contact with Patient 12/07/19 2055     (approximate)  I have reviewed the triage vital signs and the nursing notes.   HISTORY  Chief Complaint Shortness of Breath and Cough    HPI Marie Rios is a 39 y.o. female presents emergency department complaining of shortness of breath along with cough for 1 week.  States she is progressively gotten worse.  She was given a 3-day dose of prednisone that helped.  She had a negative Covid test x2.  Told her she had bronchitis.  Patient states that she has more difficulty when she is trying to talk or walk.  She states she always have to stop to take a breath.  Patient is on hormone replacement therapy and did have her Covid vaccine at the beginning of October.    Past Medical History:  Diagnosis Date  . Anxiety   . Asthma   . Breast cyst, left 2019  . Depression   . Fibromyalgia   . History of stomach ulcers   . Lumbar herniated disc   . Migraine   . Seizures [facility])     Patient Active Problem List   Diagnosis Date Noted  . Chronic fatigue 12/12/2018  . Vasomotor symptoms due to menopause 12/12/2018  . Decreased libido 12/12/2018  . Neck pain 11/13/2018  . Chronic migraine without aura, with intractable migraine, so stated, with status migrainosus 04/08/2018  . Chronic migraine without aura without status migrainosus, not intractable 01/15/2018  . Sleep apnea 09/19/2017  . Migraine 07/22/2015    Past Surgical History:  Procedure Laterality Date  . APPENDECTOMY  1996  . CHOLECYSTECTOMY  2003  . LAPAROSCOPIC OOPHERECTOMY Right 2006  . LAPAROSCOPIC OOPHERECTOMY Left 2007  . pevlic surgery  4268   Plevic mass  . TUBAL LIGATION  2003  . VAGINAL HYSTERECTOMY  2005    Prior to Admission medications   Medication Sig Start Date End Date Taking? Authorizing Provider  albuterol (VENTOLIN  HFA) 108 (90 Base) MCG/ACT inhaler Inhale 2 puffs into the lungs every 6 (six) hours as needed for wheezing or shortness of breath. 12/07/19   Raksha Wolfgang, Linden Dolin, PA-C  azithromycin (ZITHROMAX Z-PAK) 250 MG tablet 2 pills today then 1 pill a day for 4 days 12/07/19   Caryn Section Linden Dolin, PA-C  Bismuth Subsalicylate (PEPTO-BISMOL PO) Take 1 tablet by mouth as needed.    [provider]  estrogens-methylTEST (ESTRATEST) 1.25-2.5 MG tablet Take 1 tablet by mouth daily. 11/18/19   Anyanwu, Sallyanne Havers, MD  Galcanezumab-gnlm (EMGALITY) 120 MG/ML SOAJ INJECT 120 MG INTO THE SKIN EVERY 30 (THIRTY) DAYS. 09/24/19   Lomax, Amy, NP  ondansetron (ZOFRAN-ODT) 4 MG disintegrating tablet Take 1 tablet (4 mg total) by mouth every 8 (eight) hours as needed for nausea or vomiting. 01/15/18   Melvenia Beam, MD  predniSONE (STERAPRED UNI-PAK 21 TAB) 10 MG (21) TBPK tablet Take 6 pills on day one then decrease by 1 pill each day 12/07/19   Versie Starks, PA-C  rizatriptan (MAXALT) 10 MG tablet Take 1 tablet (10 mg total) by mouth as needed for migraine. May repeat in 2 hours if needed. Max 2 doses in 24 hours. Appointment needed. 09/18/19   Lomax, Amy, NP  Vitamin D, Ergocalciferol, (DRISDOL) 1.25 MG (50000 UNIT) CAPS capsule Take 1 capsule (50,000 Units total) by mouth every 7 (seven) days. 09/22/19   Debbora Presto, NP  Allergies Gabapentin, Keflex [cephalexin], Latex, and Ciprofloxacin  Family History  Problem Relation Age of Onset  . Heart attack Father        nov 2018  . Diabetes Other   . Cancer Other   . Heart disease Other   . Stroke Other   . Anemia Other   . Fibromyalgia Other   . Seizures Neg Hx   . Migraines Neg Hx     Social History Social History   Tobacco Use  . Smoking status: Former Smoker    Packs/day: 0.50    Quit date: 2018    Years since quitting: 3.8  . Smokeless tobacco: Never Used  . Tobacco comment: black & mild  Vaping Use  . Vaping Use: Former  Substance Use Topics  .  Alcohol use: Not Currently    Comment: was social, no longer drinking   . Drug use: Not Currently    Comment: Marijuana- ocassionally (07/21/15)    Review of Systems  Constitutional: No fever/chills Eyes: No visual changes. ENT: No sore throat. Respiratory: Positive for cough and shortness of breath Cardiovascular: Denies chest pain Gastrointestinal: Denies abdominal pain Genitourinary: Negative for dysuria. Musculoskeletal: Negative for back pain. Skin: Negative for rash. Psychiatric: no mood changes,     ____________________________________________   PHYSICAL EXAM:  VITAL SIGNS: ED Triage Vitals  Enc Vitals Group     BP 12/07/19 1930 (!) 130/98     Pulse Rate 12/07/19 1930 93     Resp 12/07/19 1930 20     Temp 12/07/19 1930 98.1 F (36.7 C)     Temp Source 12/07/19 1930 Oral     SpO2 12/07/19 1930 100 %     Weight 12/07/19 1931 228 lb (103.4 kg)     Height 12/07/19 1931 5\' 4"  (1.626 m)     Head Circumference --      Peak Flow --      Pain Score 12/07/19 1930 8     Pain Loc --      Pain Edu? --      Excl. in [place]? --     Constitutional: Alert and oriented. Well appearing and in no acute distress. Eyes: Conjunctivae are normal.  Head: Atraumatic. Nose: No congestion/rhinnorhea. Mouth/Throat: Mucous membranes are moist.   Neck:  supple no lymphadenopathy noted Cardiovascular: Normal rate, regular rhythm. Heart sounds are normal Respiratory: Normal respiratory effort.  No retractions, lungs c t a patient cannot talk full sentences, she is having to stop and take deep breaths, no abnormal lung sounds noted GU: deferred Musculoskeletal: FROM all extremities, warm and well perfused Neurologic:  Normal speech and language.  Skin:  Skin is warm, dry and intact. No rash noted. Psychiatric: Mood and affect are normal. Speech and behavior are normal.  ____________________________________________   LABS (all labs ordered are listed, but only abnormal results are  displayed)  Labs Reviewed  BASIC METABOLIC PANEL - Abnormal; Notable for the following components:      Result Value   Potassium 3.3 (*)    Glucose, Bld 105 (*)    Calcium 8.7 (*)    All other components within normal limits  RESPIRATORY PANEL BY RT PCR (FLU A&B, COVID)  CBC  BRAIN NATRIURETIC PEPTIDE  TROPONIN I (HIGH SENSITIVITY)   ____________________________________________   ____________________________________________  RADIOLOGY  Chest x-ray is normal CTA for PE  ____________________________________________   PROCEDURES  Procedure(s) performed: No  Procedures    ____________________________________________   INITIAL IMPRESSION / ASSESSMENT AND PLAN /  ED COURSE  Pertinent labs & imaging results that were available during my care of the patient were reviewed by me and considered in my medical decision making (see chart for details).   Patient is 39 year old female presents with shortness of breath.  See HPI.  Physical exam shows patient is unable to finish full sentences due to difficulty breathing.  DDx: Acute bronchitis, MI, PE, Covid  Basic metabolic panel has potassium 3.3 but is otherwise normal, CBC is normal, troponin is normal. Chest x-ray was normal, I did review it myself  I do not feel that the patient has Covid as she has had 2 - test.  She is afebrile.  Due to the hormone replacement therapy and recent Covid vaccine I do have concerns that she could have a PE.    CTA for PE was ordered  ----------------------------------------- 11:48 PM on 12/07/2019 -----------------------------------------  Patient had initial relief with the Decadron and DuoNeb.  However once patient got up to walk to the bathroom and back she became very short of breath.  CTA was reviewed by me.  Radiologist read states no PE.  Had a O2 saturation of 89% upon ambulation.  I did discuss this with Dr. Karma Greaser.  He will be assuming care at this time.  Patient will be  moved to the major side to room 9 for further evaluation and admission.  I also told the patient not to use the medications have been sent to the pharmacy unless approved by the admitting physician upon discharge.    Marie Rios was evaluated in Emergency Department on 12/07/2019 for the symptoms described in the history of present illness. She was evaluated in the context of the global COVID-19 pandemic, which necessitated consideration that the patient might be at risk for infection with the SARS-CoV-2 virus that causes COVID-19. Institutional protocols and algorithms that pertain to the evaluation of patients at risk for COVID-19 are in a state of rapid change based on information released by regulatory bodies including the CDC and federal and state organizations. These policies and algorithms were followed during the patient's care in the ED.    As part of my medical decision making, I reviewed the following data within the [facility] notes reviewed and incorporated, Labs reviewed , EKG interpreted NSR and see physician read, Old chart reviewed, Patient signed out to Dr. Karma Greaser, Radiograph reviewed , Evaluated by EM attending , Notes from prior ED visits and [place] Controlled Substance Database  ____________________________________________   FINAL CLINICAL IMPRESSION(S) / ED DIAGNOSES  Final diagnoses:  Shortness of breath      NEW MEDICATIONS STARTED DURING THIS VISIT:  New Prescriptions   ALBUTEROL (VENTOLIN HFA) 108 (90 BASE) MCG/ACT INHALER    Inhale 2 puffs into the lungs every 6 (six) hours as needed for wheezing or shortness of breath.   AZITHROMYCIN (ZITHROMAX Z-PAK) 250 MG TABLET    2 pills today then 1 pill a day for 4 days   PREDNISONE (STERAPRED UNI-PAK 21 TAB) 10 MG (21) TBPK TABLET    Take 6 pills on day one then decrease by 1 pill each day     Note:  This document was prepared using Dragon voice recognition software and may include  unintentional dictation errors.    Versie Starks, PA-C 12/07/19 2351    Hinda Kehr, MD 12/08/19 [phone]

## 2019-12-07 NOTE — ED Provider Notes (Signed)
-----------------------------------------   11:57 PM on 12/07/2019 -----------------------------------------  Assuming care from Ashok Cordia.  In short, Marie Rios is a 39 y.o. female with a chief complaint of dyspnea, particularly on exertion.  Refer to the original H&P for additional details.  The patient is not wheezing but becomes acutely and severely short of breath with any amount of ambulation.  She is unable to speak in full sentences.  She reports that she has had 2 negative Covid tests within the last week.  She is not having any pain but has a generally reassuring work-up.  However with a small amount of ambulation she dropped quickly to 89% on room air.  A new Covid test is pending.  BNP is pending given the possibility of acute onset heart failure but I think it is unlikely that the BNP will be negative.  However she may benefit from an echocardiogram which we cannot obtain at night.  Given her acute respiratory failure with hypoxemia, I contacted the hospitalist service for admission and spoke by phone with Dr. Sidney Ace.  He will admit the patient.  He is currently on 2 L of oxygen by nasal cannula.   Hinda Kehr, MD 12/08/19 [phone]

## 2019-12-07 NOTE — ED Notes (Signed)
20 G IV  Left AC placed by CT, Right ac IV removed by CT

## 2019-12-07 NOTE — ED Notes (Addendum)
Patient up to bathroom.  Patient became extremely short of breath with ambulating, PA aware.

## 2019-12-07 NOTE — ED Notes (Signed)
Returned to room.

## 2019-12-07 NOTE — ED Notes (Signed)
Patient transported to CT 

## 2019-12-07 NOTE — ED Triage Notes (Addendum)
Pt to ED reporting SOB, cough, sore throat and congestion that has worsened since a week ago. Pt has tested negative for COVID twice, dx with bronchitis and placed on prednisone for three days but has not felt relief. No fevers at home.   Pt having trouble talking in complete sentences in triage without increased WOB.

## 2019-12-07 NOTE — Discharge Instructions (Signed)
Follow-up with your regular doctor if not improving in 1 to 2 days.  Return emergency department if worsening.  Use your medications as prescribed.  Take the steroids in the early part of your day.  Use the inhaler every 4-6 hours as needed.  Zithromax is for infection.

## 2019-12-08 ENCOUNTER — Telehealth: Payer: Self-pay | Admitting: Family Medicine

## 2019-12-08 ENCOUNTER — Inpatient Hospital Stay (HOSPITAL_COMMUNITY)
Admit: 2019-12-08 | Discharge: 2019-12-08 | Disposition: A | Discharge status: Home / Self Care | Payer: 59 | Attending: Family Medicine | Admitting: Family Medicine

## 2019-12-08 DIAGNOSIS — F32A Depression, unspecified: Secondary | ICD-10-CM | POA: Diagnosis present

## 2019-12-08 DIAGNOSIS — Z720 Tobacco use: Secondary | ICD-10-CM

## 2019-12-08 DIAGNOSIS — G4733 Obstructive sleep apnea (adult) (pediatric): Secondary | ICD-10-CM | POA: Diagnosis present

## 2019-12-08 DIAGNOSIS — E876 Hypokalemia: Secondary | ICD-10-CM | POA: Diagnosis present

## 2019-12-08 DIAGNOSIS — Z8669 Personal history of other diseases of the nervous system and sense organs: Secondary | ICD-10-CM | POA: Diagnosis not present

## 2019-12-08 DIAGNOSIS — J40 Bronchitis, not specified as acute or chronic: Secondary | ICD-10-CM

## 2019-12-08 DIAGNOSIS — J9601 Acute respiratory failure with hypoxia: Secondary | ICD-10-CM | POA: Diagnosis present

## 2019-12-08 DIAGNOSIS — J206 Acute bronchitis due to rhinovirus: Secondary | ICD-10-CM | POA: Diagnosis present

## 2019-12-08 DIAGNOSIS — Z20822 Contact with and (suspected) exposure to covid-19: Secondary | ICD-10-CM | POA: Diagnosis present

## 2019-12-08 DIAGNOSIS — Z9049 Acquired absence of other specified parts of digestive tract: Secondary | ICD-10-CM | POA: Diagnosis not present

## 2019-12-08 DIAGNOSIS — R0602 Shortness of breath: Secondary | ICD-10-CM | POA: Diagnosis present

## 2019-12-08 DIAGNOSIS — Z91040 Latex allergy status: Secondary | ICD-10-CM | POA: Diagnosis not present

## 2019-12-08 DIAGNOSIS — Z79899 Other long term (current) drug therapy: Secondary | ICD-10-CM | POA: Diagnosis not present

## 2019-12-08 DIAGNOSIS — Z881 Allergy status to other antibiotic agents status: Secondary | ICD-10-CM | POA: Diagnosis not present

## 2019-12-08 DIAGNOSIS — B9710 Unspecified enterovirus as the cause of diseases classified elsewhere: Secondary | ICD-10-CM | POA: Diagnosis present

## 2019-12-08 DIAGNOSIS — Z6839 Body mass index (BMI) 39.0-39.9, adult: Secondary | ICD-10-CM | POA: Diagnosis not present

## 2019-12-08 DIAGNOSIS — Z833 Family history of diabetes mellitus: Secondary | ICD-10-CM | POA: Diagnosis not present

## 2019-12-08 DIAGNOSIS — J9811 Atelectasis: Secondary | ICD-10-CM | POA: Diagnosis present

## 2019-12-08 DIAGNOSIS — Z823 Family history of stroke: Secondary | ICD-10-CM | POA: Diagnosis not present

## 2019-12-08 DIAGNOSIS — J45901 Unspecified asthma with (acute) exacerbation: Secondary | ICD-10-CM | POA: Diagnosis present

## 2019-12-08 DIAGNOSIS — Z87891 Personal history of nicotine dependence: Secondary | ICD-10-CM | POA: Diagnosis not present

## 2019-12-08 DIAGNOSIS — Z90710 Acquired absence of both cervix and uterus: Secondary | ICD-10-CM | POA: Diagnosis not present

## 2019-12-08 DIAGNOSIS — M797 Fibromyalgia: Secondary | ICD-10-CM | POA: Diagnosis present

## 2019-12-08 DIAGNOSIS — Z888 Allergy status to other drugs, medicaments and biological substances status: Secondary | ICD-10-CM | POA: Diagnosis not present

## 2019-12-08 DIAGNOSIS — J9600 Acute respiratory failure, unspecified whether with hypoxia or hypercapnia: Secondary | ICD-10-CM | POA: Diagnosis present

## 2019-12-08 DIAGNOSIS — Z90722 Acquired absence of ovaries, bilateral: Secondary | ICD-10-CM | POA: Diagnosis not present

## 2019-12-08 DIAGNOSIS — Z8249 Family history of ischemic heart disease and other diseases of the circulatory system: Secondary | ICD-10-CM | POA: Diagnosis not present

## 2019-12-08 DIAGNOSIS — G43909 Migraine, unspecified, not intractable, without status migrainosus: Secondary | ICD-10-CM | POA: Diagnosis present

## 2019-12-08 LAB — CBC
HCT: 38.2 % (ref 36.0–46.0)
Hemoglobin: 12.9 g/dL (ref 12.0–15.0)
MCH: 28 pg (ref 26.0–34.0)
MCHC: 33.8 g/dL (ref 30.0–36.0)
MCV: 82.9 fL (ref 80.0–100.0)
Platelets: 266 10*3/uL (ref 150–400)
RBC: 4.61 MIL/uL (ref 3.87–5.11)
RDW: 12.7 % (ref 11.5–15.5)
WBC: 10.8 10*3/uL — ABNORMAL HIGH (ref 4.0–10.5)
nRBC: 0 % (ref 0.0–0.2)

## 2019-12-08 LAB — BASIC METABOLIC PANEL
Anion gap: 9 (ref 5–15)
BUN: 12 mg/dL (ref 6–20)
CO2: 25 mmol/L (ref 22–32)
Calcium: 8.6 mg/dL — ABNORMAL LOW (ref 8.9–10.3)
Chloride: 101 mmol/L (ref 98–111)
Creatinine, Ser: 0.66 mg/dL (ref 0.44–1.00)
GFR, Estimated: >60 mL/min (ref >60)
Glucose, Bld: 160 mg/dL — ABNORMAL HIGH (ref 70–99)
Potassium: 3.9 mmol/L (ref 3.5–5.1)
Sodium: 135 mmol/L (ref 135–145)

## 2019-12-08 LAB — RESPIRATORY PANEL BY RT PCR (FLU A&B, COVID)
Influenza A by PCR: NEGATIVE
Influenza B by PCR: NEGATIVE
SARS Coronavirus 2 by RT PCR: NEGATIVE

## 2019-12-08 LAB — HIV ANTIBODY (ROUTINE TESTING W REFLEX): HIV Screen 4th Generation wRfx: NONREACTIVE

## 2019-12-08 LAB — BRAIN NATRIURETIC PEPTIDE: B Natriuretic Peptide: 9.5 pg/mL (ref 0.0–100.0)

## 2019-12-08 MED ORDER — ENOXAPARIN SODIUM 40 MG/0.4ML ~~LOC~~ SOLN
40.0000 mg | SUBCUTANEOUS | Status: DC
  Filled 2019-12-08: qty 0.4

## 2019-12-08 MED ORDER — GUAIFENESIN ER 600 MG PO TB12
600.0000 mg | ORAL_TABLET | Freq: Two times a day (BID) | ORAL | Status: DC
  Administered 2019-12-08 – 2019-12-11 (×6): 600 mg via ORAL
  Filled 2019-12-08 (×6): qty 1

## 2019-12-08 MED ORDER — IPRATROPIUM-ALBUTEROL 0.5-2.5 (3) MG/3ML IN SOLN
3.0000 mL | Freq: Four times a day (QID) | RESPIRATORY_TRACT | Status: DC
  Administered 2019-12-08 – 2019-12-11 (×12): 3 mL via RESPIRATORY_TRACT
  Filled 2019-12-08 (×15): qty 3

## 2019-12-08 MED ORDER — VITAMIN D3 25 MCG (1000 UNIT) PO TABS
3000.0000 [IU] | ORAL_TABLET | Freq: Every day | ORAL | Status: DC
  Administered 2019-12-09 – 2019-12-11 (×3): 3000 [IU] via ORAL
  Filled 2019-12-08 (×3): qty 3

## 2019-12-08 MED ORDER — PREDNISONE 20 MG PO TABS
40.0000 mg | ORAL_TABLET | Freq: Every day | ORAL | Status: DC
  Administered 2019-12-08 – 2019-12-09 (×2): 40 mg via ORAL
  Filled 2019-12-08 (×2): qty 2

## 2019-12-08 MED ORDER — HYDROCOD POLST-CPM POLST ER 10-8 MG/5ML PO SUER
5.0000 mL | Freq: Two times a day (BID) | ORAL | Status: DC | PRN
  Administered 2019-12-08 – 2019-12-10 (×5): 5 mL via ORAL
  Filled 2019-12-08 (×5): qty 5

## 2019-12-08 MED ORDER — TRAZODONE HCL 50 MG PO TABS: 25.0000 mg | ORAL_TABLET | Freq: Every evening | ORAL | Status: DC | PRN

## 2019-12-08 MED ORDER — POTASSIUM CHLORIDE 20 MEQ PO PACK
40.0000 meq | PACK | Freq: Once | ORAL | Status: DC
  Filled 2019-12-08: qty 2

## 2019-12-08 MED ORDER — ENOXAPARIN SODIUM 60 MG/0.6ML ~~LOC~~ SOLN
0.5000 mg/kg | SUBCUTANEOUS | Status: DC
  Administered 2019-12-09: 52.5 mg via SUBCUTANEOUS
  Filled 2019-12-08 (×2): qty 0.6

## 2019-12-08 MED ORDER — ONDANSETRON 4 MG PO TBDP: 4.0000 mg | ORAL_TABLET | Freq: Three times a day (TID) | ORAL | Status: DC | PRN

## 2019-12-08 MED ORDER — ONDANSETRON HCL 4 MG PO TABS: 4.0000 mg | ORAL_TABLET | Freq: Four times a day (QID) | ORAL | Status: DC | PRN

## 2019-12-08 MED ORDER — SUMATRIPTAN SUCCINATE 50 MG PO TABS
50.0000 mg | ORAL_TABLET | ORAL | Status: DC | PRN
  Administered 2019-12-08 – 2019-12-10 (×5): 50 mg via ORAL
  Filled 2019-12-08 (×7): qty 1

## 2019-12-08 MED ORDER — ONDANSETRON HCL 4 MG/2ML IJ SOLN: 4.0000 mg | Freq: Four times a day (QID) | INTRAMUSCULAR | Status: DC | PRN

## 2019-12-08 MED ORDER — VITAMIN D (ERGOCALCIFEROL) 1.25 MG (50000 UNIT) PO CAPS: 50000.0000 [IU] | ORAL_CAPSULE | ORAL | Status: DC

## 2019-12-08 MED ORDER — SODIUM CHLORIDE 0.9 % IV SOLN: INTRAVENOUS | Status: DC

## 2019-12-08 MED ORDER — EST ESTROGENS-METHYLTEST 1.25-2.5 MG PO TABS: 1.0000 | ORAL_TABLET | Freq: Every day | ORAL | Status: DC

## 2019-12-08 MED ORDER — ALBUTEROL SULFATE (2.5 MG/3ML) 0.083% IN NEBU
2.5000 mg | INHALATION_SOLUTION | RESPIRATORY_TRACT | Status: DC | PRN
  Administered 2019-12-10 – 2019-12-11 (×3): 2.5 mg via RESPIRATORY_TRACT
  Filled 2019-12-08 (×2): qty 3

## 2019-12-08 MED ORDER — ACETAMINOPHEN 650 MG RE SUPP: 650.0000 mg | Freq: Four times a day (QID) | RECTAL | Status: DC | PRN

## 2019-12-08 MED ORDER — BISMUTH SUBSALICYLATE 262 MG/15ML PO SUSP
15.0000 mL | ORAL | Status: DC | PRN
  Filled 2019-12-08: qty 118

## 2019-12-08 MED ORDER — MAGNESIUM HYDROXIDE 400 MG/5ML PO SUSP: 30.0000 mL | Freq: Every day | ORAL | Status: DC | PRN

## 2019-12-08 MED ORDER — SODIUM CHLORIDE 0.9 % IV SOLN
500.0000 mg | INTRAVENOUS | Status: DC
  Administered 2019-12-08 – 2019-12-10 (×3): 500 mg via INTRAVENOUS
  Filled 2019-12-08 (×4): qty 500

## 2019-12-08 MED ORDER — ACETAMINOPHEN 325 MG PO TABS
650.0000 mg | ORAL_TABLET | Freq: Four times a day (QID) | ORAL | Status: DC | PRN
  Administered 2019-12-08: 650 mg via ORAL
  Filled 2019-12-08: qty 2

## 2019-12-08 NOTE — ED Notes (Signed)
Pt woke and is requesting to go to the bathroom. Pt able to ambulate by herself but reports slight dizziness upon standing.

## 2019-12-08 NOTE — Telephone Encounter (Signed)
Patients [employer] paperwork came thru fax this morning  Paper work was put into [employer] bin up front to be filled out

## 2019-12-08 NOTE — ED Notes (Signed)
Pt coughing and requesting cough medication

## 2019-12-08 NOTE — Progress Notes (Signed)
PHARMACIST - PHYSICIAN COMMUNICATION  CONCERNING:  Enoxaparin (Lovenox) for DVT Prophylaxis    RECOMMENDATION: Patient was prescribed enoxaprin 40mg  q24 hours for VTE prophylaxis.   Filed Weights   12/07/19 1931  Weight: 103.4 kg (228 lb)    Body mass index is 39.14 kg/m.  Estimated Creatinine Clearance: 110.6 mL/min (by C-G formula based on SCr of 0.66 mg/dL).   Based on [place] patient is candidate for enoxaparin 0.5mg /kg TBW SQ every 24 hours based on BMI being >30.  DESCRIPTION: Pharmacy has adjusted enoxaparin dose per [facility] policy.  Patient is now receiving enoxaparin 52.5 mg every every 24 hours   Pernell Dupre, PharmD, BCPS Clinical Pharmacist 12/08/2019 9:30 AM

## 2019-12-08 NOTE — ED Notes (Addendum)
Pt ambulatory to restroom on RA. Oxygen saturation did not drop below 90% but pt continues to have notable and reported SOB with exertion. Pt resting in bed and back to 95% on RA.

## 2019-12-08 NOTE — ED Notes (Signed)
Pt ambulatory to restroom

## 2019-12-08 NOTE — ED Notes (Signed)
Dr. Mansy at bedside. 

## 2019-12-08 NOTE — Progress Notes (Signed)
PROGRESS NOTE    [name]  TDV:[id number] DOB: 11/10/1980 DOA: 12/07/2019 PCP: Shelda Pal, DO   Chief Complaint  Patient presents with  . Shortness of Breath  . Cough    Brief Narrative:  Marie Rios  is Marie Rios 39 y.o. African-American female with Abb Gobert known history of asthma, anxiety/depression, fibromyalgia, seizure and migraine, presented to the emergency room with the onset of worsening dyspnea and cough over the last week.  She was given Imanni Burdine treat the dose of steroid therapy with prednisone that helped.  She had Devan Babino negative COVID-19 test twice.  Dyspnea has been worsening on ambulation or talking.  She was vaccinated against COVID-19 in the beginning of October.  She coughs up yellow sputum.  She admits to chest pain only with cough.  No headache or dizziness or blurred vision.  She has been having mild rhinorrhea and nasal congestion.  She had sore throat on Monday Wednesday and currently her throat is just itchy.  No earache.  Upon presentation to the emergency room, blood pressure was 130/98 with otherwise normal vital signs.  Pulse oximetry however dropped to 89% on room air on ambulation. Labs revealed hypokalemia of 3.3 and her BNP was only 9.5 with high-sensitivity troponin I of 3 and normal CBC.  COVID-19 PCR and influenza antigens are currently pending.  Chest x-ray showed mild airway thickening that can be seen in bronchitis with no consolidative airspace disease.  Chest CTA showed no evidence for PE.  It revealed mild atelectasis without acute intrathoracic process.  The patient was given duo nebs and 10 mg of IV Decadron.  She will be admitted to [facility] medically monitored bed for further evaluation and management.  Assessment & Plan:   Active Problems:   Acute respiratory failure (HCC)   Acute hypoxemic respiratory failure (HCC)   Shortness of breath   1.  Acute hypoxic respiratory failure.  This could be secondary to acute bronchitis with bronchospasm and  subsequent asthma exacerbation. - CT PE protocol without PE, mild atelectasis -- scheduled and prn nebs -- continue steroids -We will obtain Rital Cavey 2D echo since she has been having dyspnea on exertion. -CPAP will be utilized as needed given her history of obstructive sleep apnea. -Chest CTA was negative for PE and pneumonia as well as CHF and her BNP was normal.  2.  Migraine headaches. -We will continue her triptan's therapy.  DVT prophylaxis: lovenox Code Status: full  Family Communication: none at bedside Disposition:   Status is: Inpatient  Remains inpatient appropriate because:Inpatient level of care appropriate due to severity of illness   Dispo: The patient is from: Home              Anticipated d/c is to: Home              Anticipated d/c date is: 1 day              Patient currently is not medically stable to d/c.       Consultants:   none  Procedures:  Echo pending  Antimicrobials:  Anti-infectives (From admission, onward)   Start     Dose/Rate Route Frequency Ordered Stop   12/08/19 0230  azithromycin (ZITHROMAX) 500 mg in sodium chloride 0.9 % 250 mL IVPB        500 mg 250 mL/hr over 60 Minutes Intravenous Every 24 hours 12/08/19 0220     12/07/19 0000  azithromycin (ZITHROMAX Z-PAK) 250 MG tablet  12/07/19 2325           Subjective: C/o sick since Sunday Given steroids, initially better, felt worse when they were stopped Hx asthma, but none since 18-19 yo  Objective: Vitals:   12/08/19 1030 12/08/19 1235 12/08/19 1633 12/08/19 1643  BP: (!) 112/55 (!) 153/88 108/62   Pulse: 77 84 80   Resp:  18 17   Temp:  98.5 F (36.9 C) 98 F (36.7 C)   TempSrc:  Oral Oral   SpO2: 97% 100% 97% 98%  Weight:      Height:        Intake/Output Summary (Last 24 hours) at 12/08/2019 1957 Last data filed at 12/08/2019 1500 Gross per 24 hour  Intake 1144.16 ml  Output --  Net 1144.16 ml   Filed Weights   12/07/19 1931  Weight: 103.4 kg     Examination:  General exam: Appears calm and comfortable  Respiratory system: Clear to auscultation. Respiratory effort normal. Cardiovascular system: S1 & S2 heard, RRR. Gastrointestinal system: Abdomen is nondistended, soft and nontender. Central nervous system: Alert and oriented. No focal neurological deficits. Extremities: no LEE Skin: No rashes, lesions or ulcers Psychiatry: Judgement and insight appear normal. Mood & affect appropriate.     Data Reviewed: I have personally reviewed following labs and imaging studies  CBC: Recent Labs  Lab 12/07/19 1941 12/08/19 0528  WBC 9.7 10.8*  HGB 13.5 12.9  HCT 40.6 38.2  MCV 83.9 82.9  PLT 276 161    Basic Metabolic Panel: Recent Labs  Lab 12/07/19 1941 12/08/19 0528  NA 138 135  K 3.3* 3.9  CL 101 101  CO2 26 25  GLUCOSE 105* 160*  BUN 13 12  CREATININE 0.85 0.66  CALCIUM 8.7* 8.6*    GFR: Estimated Creatinine Clearance: 110.6 mL/min (by C-G formula based on SCr of 0.66 mg/dL).  Liver Function Tests: No results for input(s): AST, ALT, ALKPHOS, BILITOT, PROT, ALBUMIN in the last 168 hours.  CBG: No results for input(s): GLUCAP in the last 168 hours.   Recent Results (from the past 240 hour(s))  Respiratory Panel by RT PCR (Flu Tationna Fullard&B, Covid) - Nasopharyngeal Swab     Status: None   Collection Time: 12/07/19 11:47 PM   Specimen: Nasopharyngeal Swab  Result Value Ref Range Status   SARS Coronavirus 2 by RT PCR NEGATIVE NEGATIVE Final    Comment: (NOTE) SARS-CoV-2 target nucleic acids are NOT DETECTED.  The SARS-CoV-2 RNA is generally detectable in upper respiratoy specimens during the acute phase of infection. The lowest concentration of SARS-CoV-2 viral copies this assay can detect is 131 copies/mL. Antonio Woodhams negative result does not preclude SARS-Cov-2 infection and should not be used as the sole basis for treatment or other patient management decisions. Adia Crammer negative result may occur with  improper specimen  collection/handling, submission of specimen other than nasopharyngeal swab, presence of viral mutation(s) within the areas targeted by this assay, and inadequate number of viral copies (<131 copies/mL). Vittorio Mohs negative result must be combined with clinical observations, patient history, and epidemiological information. The expected result is Negative.  Fact Sheet for Patients:  PinkCheek.be  Fact Sheet for Healthcare Providers:  GravelBags.it  This test is no t yet approved or cleared by the [place] FDA and  has been authorized for detection and/or diagnosis of SARS-CoV-2 by FDA under an Emergency Use Authorization (EUA). This EUA will remain  in effect (meaning this test can be used) for the duration of the COVID-19 declaration  under Section 564(b)(1) of the Act, 21 U.S.C. section 360bbb-3(b)(1), unless the authorization is terminated or revoked sooner.     Influenza Jagar Lua by PCR NEGATIVE NEGATIVE Final   Influenza B by PCR NEGATIVE NEGATIVE Final    Comment: (NOTE) The Xpert Xpress SARS-CoV-2/FLU/RSV assay is intended as an aid in  the diagnosis of influenza from Nasopharyngeal swab specimens and  should not be used as Rama Mcclintock sole basis for treatment. Nasal washings and  aspirates are unacceptable for Xpert Xpress SARS-CoV-2/FLU/RSV  testing.  Fact Sheet for Patients: PinkCheek.be  Fact Sheet for Healthcare Providers: GravelBags.it  This test is not yet approved or cleared by the [place] FDA and  has been authorized for detection and/or diagnosis of SARS-CoV-2 by  FDA under an Emergency Use Authorization (EUA). This EUA will remain  in effect (meaning this test can be used) for the duration of the  Covid-19 declaration under Section 564(b)(1) of the Act, 21  U.S.C. section 360bbb-3(b)(1), unless the authorization is  terminated or revoked. Performed at [facility], [address], [place], [place] [address]          Radiology Studies: DG Chest 2 View  Result Date: 12/07/2019 CLINICAL DATA:  Shortness of breath cough, sore throat and congestion, worsening for the past week, negative COVID-19 testing x2, clinical diagnosis of bronchitis. EXAM: CHEST - 2 VIEW COMPARISON:  None. FINDINGS: Some mild airways thickening may be present compatible with Neshia Mckenzie bronchitis. No focal consolidative process. No pneumothorax or effusion. No convincing features of edema. The cardiomediastinal contours are unremarkable. No acute osseous or soft tissue abnormality. IMPRESSION: Mild airways thickening, can be seen in the setting of bronchitis. No consolidative airspace disease. Electronically Signed   By: Lovena Le M.D.   On: 12/07/2019 20:01   CT Angio Chest PE W and/or Wo Contrast  Result Date: 12/07/2019 CLINICAL DATA:  Shortness of breath, cough, sore throat and congestion EXAM: CT ANGIOGRAPHY CHEST WITH CONTRAST TECHNIQUE: Multidetector CT imaging of the chest was performed using the standard protocol during bolus administration of intravenous contrast. Multiplanar CT image reconstructions and MIPs were obtained to evaluate the vascular anatomy. CONTRAST:  11mL OMNIPAQUE IOHEXOL 350 MG/ML SOLN COMPARISON:  Chest radiograph 12/07/2019 FINDINGS: Cardiovascular: Satisfactory opacification the pulmonary arteries to the segmental level. No pulmonary artery filling defects are identified. Central pulmonary arteries are normal caliber. The aortic root is suboptimally assessed given cardiac pulsation artifact. The aorta is normal caliber. No acute luminal abnormality of the imaged aorta. Antonae Zbikowski small ductus bump is Libero Puthoff benign anatomic variant (series 11, image 66). No periaortic stranding or hemorrhage. Normal 3 vessel branching of the aortic arch. Proximal great vessels are unremarkable. Mediastinum/Nodes: No mediastinal fluid or gas. Normal thyroid gland and thoracic inlet.  No acute abnormality of the trachea or esophagus. No worrisome mediastinal, hilar or axillary adenopathy. Lungs/Pleura: No consolidation, features of edema, pneumothorax, or effusion. Dependent atelectatic changes are present likely accentuated by imaging during exhalation for the angiographic technique. No suspicious pulmonary nodules or masses. Upper Abdomen: No acute abnormalities present in the visualized portions of the upper abdomen. Contrast material within the collecting system likely from the tracking bolus. Post cholecystectomy. Few subcentimeter hypoattenuating foci present in the anterior left lobe liver (7/69, 71) too small to fully characterize on CT imaging but statistically likely benign in the absence of malignancy or known risk factor. Musculoskeletal: No acute osseous abnormality or suspicious osseous lesion. Multilevel degenerative changes are present in the imaged portions of the spine.  No worrisome chest wall soft tissue abnormality is seen. Review of the MIP images confirms the above findings. IMPRESSION: 1. No evidence of pulmonary embolism. 2. Mild atelectasis without other acute intrathoracic process. 3. Patient noted discomfort at the IV access site following injection. Patient was assessed by the CT technologist with findings relayed to the interpreting physician. Please see description below. CONTRAST EXTRAVASATION CONSULTATION: Type of contrast:  Omnipaque 350 Site of extravasation: Left are Estimated volume of extravasation: <20 ml Area of extravasation scanned with CT? no PATIENT'S SIGNS AND SYMPTOMS Patient was assessed by CT technologist staff given remote site of operations. Skin blistering/ulceration: no Decrease capillary refill: no Change in skin color: no Decreased motor function or severe tightness: no Decreased pulses distal to site of extravasation: no Altered sensation: no Increasing pain or signs of increased swelling during observation: no TREATMENT Limb elevation: yes  Heat pads applied: yes Plastic surgery consulted? no DOCUMENTATION AND FOLLOW-UP Patient's questions answered? yes Patient care team nursing staff informed. These results and discussion of extravasation were called by telephone at the time of report submission on 12/07/2019 at 10:25 pm to provider [facility] , who verbally acknowledged these results. Electronically Signed   By: Lovena Le M.D.   On: 12/07/2019 22:27        Scheduled Meds: . [START ON 12/09/2019] enoxaparin (LOVENOX) injection  0.5 mg/kg Subcutaneous Q24H  . guaiFENesin  600 mg Oral BID  . ipratropium-albuterol  3 mL Nebulization QID  . potassium chloride  40 mEq Oral Once  . predniSONE  40 mg Oral Q breakfast  . Vitamin D (Ergocalciferol)  50,000 Units Oral Q7 days   Continuous Infusions: . sodium chloride 100 mL/hr at 12/08/19 1939  . azithromycin Stopped (12/08/19 0631)     LOS: 0 days    Time spent: over 48 min    Fayrene Helper, MD [facility]   To contact the attending provider between 7A-7P or the covering provider during after hours 7P-7A, please log into the web site [email or web address] and access using universal [place] password for that web site. If you do not have the password, please call the hospital operator.  12/08/2019, 7:57 PM

## 2019-12-08 NOTE — ED Notes (Signed)
Pt provided cola as requested

## 2019-12-08 NOTE — H&P (Signed)
[place]   PATIENT NAME: Marie Rios    MR#:  [id number]  DATE OF BIRTH:  18-Mar-1980  DATE OF ADMISSION:  12/07/2019  PRIMARY CARE PHYSICIAN: Shelda Pal, DO   REQUESTING/REFERRING PHYSICIAN: Hinda Kehr, MD  CHIEF COMPLAINT:   Chief Complaint  Patient presents with  . Shortness of Breath  . Cough    HISTORY OF PRESENT ILLNESS:  Marie Rios  is a 39 y.o. African-American female with a known history of asthma, anxiety/depression, fibromyalgia, seizure and migraine, presented to the emergency room with the onset of worsening dyspnea and cough over the last week.  She was given a treat the dose of steroid therapy with prednisone that helped.  She had a negative COVID-19 test twice.  Dyspnea has been worsening on ambulation or talking.  She was vaccinated against COVID-19 in the beginning of October.  She coughs up yellow sputum.  She admits to chest pain only with cough.  No headache or dizziness or blurred vision.  She has been having mild rhinorrhea and nasal congestion.  She had sore throat on Monday Wednesday and currently her throat is just itchy.  No earache.  Upon presentation to the emergency room, blood pressure was 130/98 with otherwise normal vital signs.  Pulse oximetry however dropped to 89% on room air on ambulation. Labs revealed hypokalemia of 3.3 and her BNP was only 9.5 with high-sensitivity troponin I of 3 and normal CBC.  COVID-19 PCR and influenza antigens are currently pending.  Chest x-ray showed mild airway thickening that can be seen in bronchitis with no consolidative airspace disease.  Chest CTA showed no evidence for PE.  It revealed mild atelectasis without acute intrathoracic process.  The patient was given duo nebs and 10 mg of IV Decadron.  She will be admitted to a medically monitored bed for further evaluation and management. PAST MEDICAL HISTORY:   Past Medical History:  Diagnosis Date  . Anxiety   . Asthma   . Breast  cyst, left 2019  . Depression   . Fibromyalgia   . History of stomach ulcers   . Lumbar herniated disc   . Migraine   . Seizures ([place])     PAST SURGICAL HISTORY:   Past Surgical History:  Procedure Laterality Date  . APPENDECTOMY  1996  . CHOLECYSTECTOMY  2003  . LAPAROSCOPIC OOPHERECTOMY Right 2006  . LAPAROSCOPIC OOPHERECTOMY Left 2007  . pevlic surgery  5009   Plevic mass  . TUBAL LIGATION  2003  . VAGINAL HYSTERECTOMY  2005    SOCIAL HISTORY:   Social History   Tobacco Use  . Smoking status: Former Smoker    Packs/day: 0.50    Quit date: 2018    Years since quitting: 3.8  . Smokeless tobacco: Never Used  . Tobacco comment: black & mild  Substance Use Topics  . Alcohol use: Not Currently    Comment: was social, no longer drinking     FAMILY HISTORY:   Family History  Problem Relation Age of Onset  . Heart attack Father        nov 2018  . Diabetes Other   . Cancer Other   . Heart disease Other   . Stroke Other   . Anemia Other   . Fibromyalgia Other   . Seizures Neg Hx   . Migraines Neg Hx     DRUG ALLERGIES:   Allergies  Allergen Reactions  . Gabapentin     Intolerant.   Marland Kitchen  Keflex [Cephalexin]     Reflux  . Latex Other (See Comments)    Rash  . Ciprofloxacin Rash    REVIEW OF SYSTEMS:   ROS As per history of present illness. All pertinent systems were reviewed above. Constitutional, HEENT, cardiovascular, respiratory, GI, GU, musculoskeletal, neuro, psychiatric, endocrine, integumentary and hematologic systems were reviewed and are otherwise negative/unremarkable except for positive findings mentioned above in the HPI.   MEDICATIONS AT HOME:   Prior to Admission medications   Medication Sig Start Date End Date Taking? Authorizing Provider  albuterol (VENTOLIN HFA) 108 (90 Base) MCG/ACT inhaler Inhale 2 puffs into the lungs every 6 (six) hours as needed for wheezing or shortness of breath. 12/07/19   Fisher, Linden Dolin, PA-C  azithromycin  (ZITHROMAX Z-PAK) 250 MG tablet 2 pills today then 1 pill a day for 4 days 12/07/19   Caryn Section Linden Dolin, PA-C  Bismuth Subsalicylate (PEPTO-BISMOL PO) Take 1 tablet by mouth as needed.    [provider]  estrogens-methylTEST (ESTRATEST) 1.25-2.5 MG tablet Take 1 tablet by mouth daily. 11/18/19   Anyanwu, Sallyanne Havers, MD  Galcanezumab-gnlm (EMGALITY) 120 MG/ML SOAJ INJECT 120 MG INTO THE SKIN EVERY 30 (THIRTY) DAYS. 09/24/19   Lomax, Amy, NP  ondansetron (ZOFRAN-ODT) 4 MG disintegrating tablet Take 1 tablet (4 mg total) by mouth every 8 (eight) hours as needed for nausea or vomiting. 01/15/18   Melvenia Beam, MD  predniSONE (STERAPRED UNI-PAK 21 TAB) 10 MG (21) TBPK tablet Take 6 pills on day one then decrease by 1 pill each day 12/07/19   Versie Starks, PA-C  rizatriptan (MAXALT) 10 MG tablet Take 1 tablet (10 mg total) by mouth as needed for migraine. May repeat in 2 hours if needed. Max 2 doses in 24 hours. Appointment needed. 09/18/19   Lomax, Amy, NP  Vitamin D, Ergocalciferol, (DRISDOL) 1.25 MG (50000 UNIT) CAPS capsule Take 1 capsule (50,000 Units total) by mouth every 7 (seven) days. 09/22/19   Lomax, Amy, NP      VITAL SIGNS:  Blood pressure 123/87, pulse 79, temperature 98.1 F (36.7 C), temperature source Oral, resp. rate 20, height 5\' 4"  (1.626 m), weight 103.4 kg, SpO2 92 %.  PHYSICAL EXAMINATION:  Physical Exam  GENERAL:  39 y.o.-year-old African-American female patient lying in the bed with no acute distress.  EYES: Pupils equal, round, reactive to light and accommodation. No scleral icterus. Extraocular muscles intact.  HEENT: Head atraumatic, normocephalic. Oropharynx and nasopharynx clear.  NECK:  Supple, no jugular venous distention. No thyroid enlargement, no tenderness.  LUNGS: Mildly diffuse residual and expiratory wheezes and occasional rhonchi with diminished expiratory airflow and harsh vesicular breathing. CARDIOVASCULAR: Regular rate and rhythm, S1, S2 normal. No  murmurs, rubs, or gallops.  ABDOMEN: Soft, nondistended, nontender. Bowel sounds present. No organomegaly or mass.  EXTREMITIES: No pedal edema, cyanosis, or clubbing.  NEUROLOGIC: Cranial nerves II through XII are intact. Muscle strength 5/5 in all extremities. Sensation intact. Gait not checked.  PSYCHIATRIC: The patient is alert and oriented x 3.  Normal affect and good eye contact. SKIN: No obvious rash, lesion, or ulcer.   LABORATORY PANEL:   CBC Recent Labs  Lab 12/07/19 1941  WBC 9.7  HGB 13.5  HCT 40.6  PLT 276   ------------------------------------------------------------------------------------------------------------------  Chemistries  Recent Labs  Lab 12/07/19 1941  NA 138  K 3.3*  CL 101  CO2 26  GLUCOSE 105*  BUN 13  CREATININE 0.85  CALCIUM 8.7*   ------------------------------------------------------------------------------------------------------------------  Cardiac Enzymes No results for input(s): TROPONINI in the last 168 hours. ------------------------------------------------------------------------------------------------------------------  RADIOLOGY:  DG Chest 2 View  Result Date: 12/07/2019 CLINICAL DATA:  Shortness of breath cough, sore throat and congestion, worsening for the past week, negative COVID-19 testing x2, clinical diagnosis of bronchitis. EXAM: CHEST - 2 VIEW COMPARISON:  None. FINDINGS: Some mild airways thickening may be present compatible with a bronchitis. No focal consolidative process. No pneumothorax or effusion. No convincing features of edema. The cardiomediastinal contours are unremarkable. No acute osseous or soft tissue abnormality. IMPRESSION: Mild airways thickening, can be seen in the setting of bronchitis. No consolidative airspace disease. Electronically Signed   By: Lovena Le M.D.   On: 12/07/2019 20:01   CT Angio Chest PE W and/or Wo Contrast  Result Date: 12/07/2019 CLINICAL DATA:  Shortness of breath, cough,  sore throat and congestion EXAM: CT ANGIOGRAPHY CHEST WITH CONTRAST TECHNIQUE: Multidetector CT imaging of the chest was performed using the standard protocol during bolus administration of intravenous contrast. Multiplanar CT image reconstructions and MIPs were obtained to evaluate the vascular anatomy. CONTRAST:  177mL OMNIPAQUE IOHEXOL 350 MG/ML SOLN COMPARISON:  Chest radiograph 12/07/2019 FINDINGS: Cardiovascular: Satisfactory opacification the pulmonary arteries to the segmental level. No pulmonary artery filling defects are identified. Central pulmonary arteries are normal caliber. The aortic root is suboptimally assessed given cardiac pulsation artifact. The aorta is normal caliber. No acute luminal abnormality of the imaged aorta. A small ductus bump is a benign anatomic variant (series 11, image 66). No periaortic stranding or hemorrhage. Normal 3 vessel branching of the aortic arch. Proximal great vessels are unremarkable. Mediastinum/Nodes: No mediastinal fluid or gas. Normal thyroid gland and thoracic inlet. No acute abnormality of the trachea or esophagus. No worrisome mediastinal, hilar or axillary adenopathy. Lungs/Pleura: No consolidation, features of edema, pneumothorax, or effusion. Dependent atelectatic changes are present likely accentuated by imaging during exhalation for the angiographic technique. No suspicious pulmonary nodules or masses. Upper Abdomen: No acute abnormalities present in the visualized portions of the upper abdomen. Contrast material within the collecting system likely from the tracking bolus. Post cholecystectomy. Few subcentimeter hypoattenuating foci present in the anterior left lobe liver (7/69, 71) too small to fully characterize on CT imaging but statistically likely benign in the absence of malignancy or known risk factor. Musculoskeletal: No acute osseous abnormality or suspicious osseous lesion. Multilevel degenerative changes are present in the imaged portions of  the spine. No worrisome chest wall soft tissue abnormality is seen. Review of the MIP images confirms the above findings. IMPRESSION: 1. No evidence of pulmonary embolism. 2. Mild atelectasis without other acute intrathoracic process. 3. Patient noted discomfort at the IV access site following injection. Patient was assessed by the CT technologist with findings relayed to the interpreting physician. Please see description below. CONTRAST EXTRAVASATION CONSULTATION: Type of contrast:  Omnipaque 350 Site of extravasation: Left are Estimated volume of extravasation: <20 ml Area of extravasation scanned with CT? no PATIENT'S SIGNS AND SYMPTOMS Patient was assessed by CT technologist staff given remote site of operations. Skin blistering/ulceration: no Decrease capillary refill: no Change in skin color: no Decreased motor function or severe tightness: no Decreased pulses distal to site of extravasation: no Altered sensation: no Increasing pain or signs of increased swelling during observation: no TREATMENT Limb elevation: yes Heat pads applied: yes Plastic surgery consulted? no DOCUMENTATION AND FOLLOW-UP Patient's questions answered? yes Patient care team nursing staff informed. These results and discussion of extravasation were called by telephone at the  time of report submission on 12/07/2019 at 10:25 pm to provider [facility] , who verbally acknowledged these results. Electronically Signed   By: Lovena Le M.D.   On: 12/07/2019 22:27      IMPRESSION AND PLAN:   1.  Acute hypoxic respiratory failure.  This could be secondary to acute bronchitis with bronchospasm and subsequent asthma exacerbation. -The patient be admitted to a medical monitored bed. -O2 protocol will be followed. -We will continue bronchodilator therapy with DuoNebs scheduled and as needed basis. -We will continue steroid therapy with IV Solu-Medrol. -We will obtain a 2D echo since she has been having dyspnea on exertion. -CPAP will be  utilized as needed given her history of obstructive sleep apnea. -Chest CTA was negative for PE and pneumonia as well as CHF and her BNP was normal.  2.  Migraine headaches. -We will continue her triptan's therapy.  3.  DVT prophylaxis. -Subcutaneous Lovenox   All the records are reviewed and case discussed with ED provider. The plan of care was discussed in details with the patient (and family). I answered all questions. The patient agreed to proceed with the above mentioned plan. Further management will depend upon hospital course.   CODE STATUS: Full code  Status is: Inpatient  Remains inpatient appropriate because:Ongoing diagnostic testing needed not appropriate for outpatient work up, Unsafe d/c plan, IV treatments appropriate due to intensity of illness or inability to take PO and Inpatient level of care appropriate due to severity of illness   Dispo: The patient is from: Home              Anticipated d/c is to: Home              Anticipated d/c date is: 2 days              Patient currently is not medically stable to d/c.   TOTAL TIME TAKING CARE OF THIS PATIENT: 55 minutes.    Christel Mormon M.D on 12/08/2019 at 1:24 AM  [facility]   From 7 PM-7 AM, contact night-coverage [email or web address]  CC: Primary care physician; Shelda Pal, DO

## 2019-12-08 NOTE — ED Notes (Signed)
Husband at bedside. Dr. Karma Greaser to bedside for final update.

## 2019-12-08 NOTE — Telephone Encounter (Signed)
PCP is aware through a my chart message

## 2019-12-08 NOTE — ED Notes (Signed)
Pt resting in bed with lights dimmed and call bell in reach. NAD noted at this time.  

## 2019-12-09 ENCOUNTER — Inpatient Hospital Stay: Payer: 59

## 2019-12-09 LAB — CBC WITH DIFFERENTIAL/PLATELET
Abs Immature Granulocytes: 0.05 10*3/uL (ref 0.00–0.07)
Basophils Absolute: 0 10*3/uL (ref 0.0–0.1)
Basophils Relative: 0 %
Eosinophils Absolute: 0 10*3/uL (ref 0.0–0.5)
Eosinophils Relative: 0 %
HCT: 36.9 % (ref 36.0–46.0)
Hemoglobin: 12.6 g/dL (ref 12.0–15.0)
Immature Granulocytes: 1 %
Lymphocytes Relative: 21 %
Lymphs Abs: 2.2 10*3/uL (ref 0.7–4.0)
MCH: 28.3 pg (ref 26.0–34.0)
MCHC: 34.1 g/dL (ref 30.0–36.0)
MCV: 82.9 fL (ref 80.0–100.0)
Monocytes Absolute: 0.6 10*3/uL (ref 0.1–1.0)
Monocytes Relative: 5 %
Neutro Abs: 7.7 10*3/uL (ref 1.7–7.7)
Neutrophils Relative %: 73 %
Platelets: 255 10*3/uL (ref 150–400)
RBC: 4.45 MIL/uL (ref 3.87–5.11)
RDW: 13.1 % (ref 11.5–15.5)
WBC: 10.6 10*3/uL — ABNORMAL HIGH (ref 4.0–10.5)
nRBC: 0 % (ref 0.0–0.2)

## 2019-12-09 LAB — COMPREHENSIVE METABOLIC PANEL
ALT: 29 U/L (ref 0–44)
AST: 17 U/L (ref 15–41)
Albumin: 3.3 g/dL — ABNORMAL LOW (ref 3.5–5.0)
Alkaline Phosphatase: 48 U/L (ref 38–126)
Anion gap: 7 (ref 5–15)
BUN: 10 mg/dL (ref 6–20)
CO2: 24 mmol/L (ref 22–32)
Calcium: 8.3 mg/dL — ABNORMAL LOW (ref 8.9–10.3)
Chloride: 109 mmol/L (ref 98–111)
Creatinine, Ser: 0.57 mg/dL (ref 0.44–1.00)
GFR, Estimated: >60 mL/min (ref >60)
Glucose, Bld: 103 mg/dL — ABNORMAL HIGH (ref 70–99)
Potassium: 4 mmol/L (ref 3.5–5.1)
Sodium: 140 mmol/L (ref 135–145)
Total Bilirubin: 0.7 mg/dL (ref 0.3–1.2)
Total Protein: 6.7 g/dL (ref 6.5–8.1)

## 2019-12-09 LAB — ECHOCARDIOGRAM COMPLETE
Height: 64 in
S' Lateral: 2.43 cm
Weight: 3648 oz

## 2019-12-09 LAB — RESPIRATORY PANEL BY PCR

## 2019-12-09 LAB — TSH: TSH: 0.664 u[IU]/mL (ref 0.350–4.500)

## 2019-12-09 LAB — MAGNESIUM: Magnesium: 2.1 mg/dL (ref 1.7–2.4)

## 2019-12-09 LAB — PHOSPHORUS: Phosphorus: 2.7 mg/dL (ref 2.5–4.6)

## 2019-12-09 MED ORDER — METHYLPREDNISOLONE SODIUM SUCC 40 MG IJ SOLR
40.0000 mg | Freq: Two times a day (BID) | INTRAMUSCULAR | Status: DC
  Administered 2019-12-09: 40 mg via INTRAVENOUS
  Filled 2019-12-09: qty 1

## 2019-12-09 MED ORDER — METHYLPREDNISOLONE SODIUM SUCC 40 MG IJ SOLR
40.0000 mg | Freq: Four times a day (QID) | INTRAMUSCULAR | Status: DC
  Administered 2019-12-09 – 2019-12-10 (×3): 40 mg via INTRAVENOUS
  Filled 2019-12-09 (×3): qty 1

## 2019-12-09 NOTE — Plan of Care (Signed)
On arrival to room, pt was alert with no complaints. Instructed to walk with sat monitor. Pt walked approximately 31ft with acute onset of SOB with desat to 94% from 100% with noted paleness and lightheadedness. Returned to bed with increased tiredness.

## 2019-12-09 NOTE — Progress Notes (Signed)
Pt's nurse, Patrice stopped VAST RN in hallway and asked that I look at pt's PIV in her left hand; she stated the site did not hurt with Solu-medrol administration, but hurt with NS flush. VAST RN assessed site which looked unremarkable. However, with flushing of NS pt yelped and pulled hand back. Reported findings to [facility] via [employer] and requested she dc PIV and place consult for new IV placement as pt reported multiple sticks this am for current IV in hand. Advised this RN will return as soon as able to assess for new IV placement.

## 2019-12-09 NOTE — Plan of Care (Signed)

## 2019-12-09 NOTE — Progress Notes (Signed)
PROGRESS NOTE    [name]  HOZ:[id number] DOB: 01/14/1981 DOA: 12/07/2019 PCP: Shelda Pal, DO   Chief Complaint  Patient presents with   Shortness of Breath   Cough    Brief Narrative:  Marie Rios  is Marie Rios 39 y.o. African-American female with Korrina Zern known history of asthma, anxiety/depression, fibromyalgia, seizure and migraine, who presented to the ED with worsening SOB and cough over the past week.  She initially improved with steroid therapy, but worsened again after she completed this so she presented to the hospital.  She's currently being treated for an asthma exacerbation with steroids and scheduled and prn nebs.   Assessment & Plan:   Active Problems:   Acute respiratory failure (HCC)   Acute respiratory failure with hypoxemia (HCC)   Shortness of breath   1.  Acute hypoxic respiratory failure.  This could be secondary to acute bronchitis with bronchospasm and subsequent asthma exacerbation. - CT PE protocol without PE, mild atelectasis  -- dyspnea on exertion seems out of proportion to physical exam findings and workup to this point, will continue to monitor closely, trial IV steroids - w/u further as indicated -- CXR 11/9 without active disease -- will follow LE [place] due to above -- also, follow RVP (add on) - negative covid/flu -- scheduled and prn nebs -- continue steroids -- TSH wnl  - echo with normal EF, indeterminate diastolic parameters (see report) -CPAP will be utilized as needed given her history of obstructive sleep apnea. -Chest CTA was negative for PE and pneumonia as well as CHF and her BNP was normal.  2.  Migraine headaches. -We will continue her triptan's therapy.  DVT prophylaxis: lovenox Code Status: full  Family Communication: none at bedside Disposition:   Status is: Inpatient  Remains inpatient appropriate because:Inpatient level of care appropriate due to severity of illness   Dispo: The patient is from: Home               Anticipated d/c is to: Home              Anticipated d/c date is: 1 day              Patient currently is not medically stable to d/c.       Consultants:   none  Procedures:  Echo IMPRESSIONS    1. Left ventricular ejection fraction, by estimation, is 65 to 70%. The  left ventricle has normal function. Left ventricular endocardial border  not optimally defined to evaluate regional wall motion. Left ventricular  diastolic parameters are  indeterminate.  2. Right ventricular systolic function is normal. The right ventricular  size is mildly enlarged. Tricuspid regurgitation signal is inadequate for  assessing PA pressure.  3. The mitral valve is normal in structure. No evidence of mitral valve  regurgitation. No evidence of mitral stenosis.  4. The aortic valve was not well visualized. Aortic valve regurgitation  is not visualized. No aortic stenosis is present.   Antimicrobials:  Anti-infectives (From admission, onward)   Start     Dose/Rate Route Frequency Ordered Stop   12/08/19 0230  azithromycin (ZITHROMAX) 500 mg in sodium chloride 0.9 % 250 mL IVPB        500 mg 250 mL/hr over 60 Minutes Intravenous Every 24 hours 12/08/19 0220     12/07/19 0000  azithromycin (ZITHROMAX Z-PAK) 250 MG tablet           12/07/19 2325  Subjective: Continued SOB with exertion, nebs help   Objective: Vitals:   12/09/19 0021 12/09/19 0727 12/09/19 0750 12/09/19 1139  BP: (!) 105/52 128/66  123/81  Pulse: 85 65  75  Resp: 18 18  20   Temp: 97.8 F (36.6 C) 97.8 F (36.6 C)  97.7 F (36.5 C)  TempSrc: Oral Oral  Oral  SpO2: 97% 100% 100% 100%  Weight:      Height:        Intake/Output Summary (Last 24 hours) at 12/09/2019 1358 Last data filed at 12/09/2019 1006 Gross per 24 hour  Intake 1411.67 ml  Output --  Net 1411.67 ml   Filed Weights   12/07/19 1931  Weight: 103.4 kg    Examination:  General: No acute distress. Cardiovascular: Heart sounds  show Teodor Prater regular rate, and rhythm Lungs: Clear to auscultation bilaterally Abdomen: Soft, nontender, nondistended Neurological: Alert and oriented 3. Moves all extremities 4. Cranial nerves II through XII grossly intact. Skin: Warm and dry. No rashes or lesions. Extremities: No clubbing or cyanosis. No edema.   Data Reviewed: I have personally reviewed following labs and imaging studies  CBC: Recent Labs  Lab 12/07/19 1941 12/08/19 0528 12/09/19 0609  WBC 9.7 10.8* 10.6*  NEUTROABS  --   --  7.7  HGB 13.5 12.9 12.6  HCT 40.6 38.2 36.9  MCV 83.9 82.9 82.9  PLT 276 266 093    Basic Metabolic Panel: Recent Labs  Lab 12/07/19 1941 12/08/19 0528 12/09/19 0609  NA 138 135 140  K 3.3* 3.9 4.0  CL 101 101 109  CO2 26 25 24   GLUCOSE 105* 160* 103*  BUN 13 12 10   CREATININE 0.85 0.66 0.57  CALCIUM 8.7* 8.6* 8.3*  MG  --   --  2.1  PHOS  --   --  2.7    GFR: Estimated Creatinine Clearance: 110.6 mL/min (by C-G formula based on SCr of 0.57 mg/dL).  Liver Function Tests: Recent Labs  Lab 12/09/19 0609  AST 17  ALT 29  ALKPHOS 48  BILITOT 0.7  PROT 6.7  ALBUMIN 3.3*    CBG: No results for input(s): GLUCAP in the last 168 hours.   Recent Results (from the past 240 hour(s))  Respiratory Panel by RT PCR (Flu Brevyn Ring&B, Covid) - Nasopharyngeal Swab     Status: None   Collection Time: 12/07/19 11:47 PM   Specimen: Nasopharyngeal Swab  Result Value Ref Range Status   SARS Coronavirus 2 by RT PCR NEGATIVE NEGATIVE Final    Comment: (NOTE) SARS-CoV-2 target nucleic acids are NOT DETECTED.  The SARS-CoV-2 RNA is generally detectable in upper respiratoy specimens during the acute phase of infection. The lowest concentration of SARS-CoV-2 viral copies this assay can detect is 131 copies/mL. Butch Otterson negative result does not preclude SARS-Cov-2 infection and should not be used as the sole basis for treatment or other patient management decisions. Graceson Nichelson negative result may occur with    improper specimen collection/handling, submission of specimen other than nasopharyngeal swab, presence of viral mutation(s) within the areas targeted by this assay, and inadequate number of viral copies (<131 copies/mL). Fiza Nation negative result must be combined with clinical observations, patient history, and epidemiological information. The expected result is Negative.  Fact Sheet for Patients:  PinkCheek.be  Fact Sheet for Healthcare Providers:  GravelBags.it  This test is no t yet approved or cleared by the [place] FDA and  has been authorized for detection and/or diagnosis of SARS-CoV-2 by FDA under an  Emergency Use Authorization (EUA). This EUA will remain  in effect (meaning this test can be used) for the duration of the COVID-19 declaration under Section 564(b)(1) of the Act, 21 U.S.C. section 360bbb-3(b)(1), unless the authorization is terminated or revoked sooner.     Influenza Monee Dembeck by PCR NEGATIVE NEGATIVE Final   Influenza B by PCR NEGATIVE NEGATIVE Final    Comment: (NOTE) The Xpert Xpress SARS-CoV-2/FLU/RSV assay is intended as an aid in  the diagnosis of influenza from Nasopharyngeal swab specimens and  should not be used as Alaney Witter sole basis for treatment. Nasal washings and  aspirates are unacceptable for Xpert Xpress SARS-CoV-2/FLU/RSV  testing.  Fact Sheet for Patients: PinkCheek.be  Fact Sheet for Healthcare Providers: GravelBags.it  This test is not yet approved or cleared by the [place] FDA and  has been authorized for detection and/or diagnosis of SARS-CoV-2 by  FDA under an Emergency Use Authorization (EUA). This EUA will remain  in effect (meaning this test can be used) for the duration of the  Covid-19 declaration under Section 564(b)(1) of the Act, 21  U.S.C. section 360bbb-3(b)(1), unless the authorization is  terminated or  revoked. Performed at [facility], [address], [place], [place] [address]          Radiology Studies: DG Chest 2 View  Result Date: 12/07/2019 CLINICAL DATA:  Shortness of breath cough, sore throat and congestion, worsening for the past week, negative COVID-19 testing x2, clinical diagnosis of bronchitis. EXAM: CHEST - 2 VIEW COMPARISON:  None. FINDINGS: Some mild airways thickening may be present compatible with Anaalicia Reimann bronchitis. No focal consolidative process. No pneumothorax or effusion. No convincing features of edema. The cardiomediastinal contours are unremarkable. No acute osseous or soft tissue abnormality. IMPRESSION: Mild airways thickening, can be seen in the setting of bronchitis. No consolidative airspace disease. Electronically Signed   By: Lovena Le M.D.   On: 12/07/2019 20:01   CT Angio Chest PE W and/or Wo Contrast  Result Date: 12/07/2019 CLINICAL DATA:  Shortness of breath, cough, sore throat and congestion EXAM: CT ANGIOGRAPHY CHEST WITH CONTRAST TECHNIQUE: Multidetector CT imaging of the chest was performed using the standard protocol during bolus administration of intravenous contrast. Multiplanar CT image reconstructions and MIPs were obtained to evaluate the vascular anatomy. CONTRAST:  190mL OMNIPAQUE IOHEXOL 350 MG/ML SOLN COMPARISON:  Chest radiograph 12/07/2019 FINDINGS: Cardiovascular: Satisfactory opacification the pulmonary arteries to the segmental level. No pulmonary artery filling defects are identified. Central pulmonary arteries are normal caliber. The aortic root is suboptimally assessed given cardiac pulsation artifact. The aorta is normal caliber. No acute luminal abnormality of the imaged aorta. Rease Wence small ductus bump is Waymon Laser benign anatomic variant (series 11, image 66). No periaortic stranding or hemorrhage. Normal 3 vessel branching of the aortic arch. Proximal great vessels are unremarkable. Mediastinum/Nodes: No mediastinal fluid or gas. Normal  thyroid gland and thoracic inlet. No acute abnormality of the trachea or esophagus. No worrisome mediastinal, hilar or axillary adenopathy. Lungs/Pleura: No consolidation, features of edema, pneumothorax, or effusion. Dependent atelectatic changes are present likely accentuated by imaging during exhalation for the angiographic technique. No suspicious pulmonary nodules or masses. Upper Abdomen: No acute abnormalities present in the visualized portions of the upper abdomen. Contrast material within the collecting system likely from the tracking bolus. Post cholecystectomy. Few subcentimeter hypoattenuating foci present in the anterior left lobe liver (7/69, 71) too small to fully characterize on CT imaging but statistically likely benign in the absence of malignancy or  known risk factor. Musculoskeletal: No acute osseous abnormality or suspicious osseous lesion. Multilevel degenerative changes are present in the imaged portions of the spine. No worrisome chest wall soft tissue abnormality is seen. Review of the MIP images confirms the above findings. IMPRESSION: 1. No evidence of pulmonary embolism. 2. Mild atelectasis without other acute intrathoracic process. 3. Patient noted discomfort at the IV access site following injection. Patient was assessed by the CT technologist with findings relayed to the interpreting physician. Please see description below. CONTRAST EXTRAVASATION CONSULTATION: Type of contrast:  Omnipaque 350 Site of extravasation: Left are Estimated volume of extravasation: <20 ml Area of extravasation scanned with CT? no PATIENT'S SIGNS AND SYMPTOMS Patient was assessed by CT technologist staff given remote site of operations. Skin blistering/ulceration: no Decrease capillary refill: no Change in skin color: no Decreased motor function or severe tightness: no Decreased pulses distal to site of extravasation: no Altered sensation: no Increasing pain or signs of increased swelling during observation:  no TREATMENT Limb elevation: yes Heat pads applied: yes Plastic surgery consulted? no DOCUMENTATION AND FOLLOW-UP Patient's questions answered? yes Patient care team nursing staff informed. These results and discussion of extravasation were called by telephone at the time of report submission on 12/07/2019 at 10:25 pm to provider [facility] , who verbally acknowledged these results. Electronically Signed   By: Lovena Le M.D.   On: 12/07/2019 22:27   DG Chest Port 1 View  Result Date: 12/09/2019 CLINICAL DATA:  Short of breath EXAM: PORTABLE CHEST 1 VIEW COMPARISON:  12/07/2019 FINDINGS: The heart size and mediastinal contours are within normal limits. Both lungs are clear. The visualized skeletal structures are unremarkable. IMPRESSION: No active disease. Electronically Signed   By: Franchot Gallo M.D.   On: 12/09/2019 07:56   ECHOCARDIOGRAM COMPLETE  Result Date: 12/09/2019    ECHOCARDIOGRAM REPORT   Patient Name:   KHAMANI DANIELY Date of Exam: 12/08/2019 Medical Rec #:  [id number]        Height:       64.0 in Accession #:    [id number]       Weight:       228.0 lb Date of Birth:  [birth year]       BSA:          2.068 m Patient Age:    38 years         BP:           108/62 mmHg Patient Gender: F                HR:           93 bpm. Exam Location:  [facility] Procedure: 2D Echo Indications:     Dyspnea 786.09 / R06.00  History:         Patient has no prior history of Echocardiogram examinations.                  Signs/Symptoms:Shortness of Breath.  Sonographer:     Avanell Shackleton Referring Phys:  [id number] Arvella Merles MANSY Diagnosing Phys: Nelva Bush MD IMPRESSIONS  1. Left ventricular ejection fraction, by estimation, is 65 to 70%. The left ventricle has normal function. Left ventricular endocardial border not optimally defined to evaluate regional wall motion. Left ventricular diastolic parameters are indeterminate.  2. Right ventricular systolic function is normal. The right ventricular size is mildly  enlarged. Tricuspid regurgitation signal is inadequate for assessing PA pressure.  3. The mitral valve is normal in structure. No  evidence of mitral valve regurgitation. No evidence of mitral stenosis.  4. The aortic valve was not well visualized. Aortic valve regurgitation is not visualized. No aortic stenosis is present. FINDINGS  Left Ventricle: Left ventricular ejection fraction, by estimation, is 65 to 70%. The left ventricle has normal function. Left ventricular endocardial border not optimally defined to evaluate regional wall motion. The left ventricular internal cavity size was normal in size. There is no left ventricular hypertrophy. Left ventricular diastolic parameters are indeterminate. Right Ventricle: The right ventricular size is mildly enlarged. No increase in right ventricular wall thickness. Right ventricular systolic function is normal. Tricuspid regurgitation signal is inadequate for assessing PA pressure. Left Atrium: Left atrial size was normal in size. Right Atrium: Right atrial size was normal in size. Pericardium: The pericardium was not well visualized. Mitral Valve: The mitral valve is normal in structure. No evidence of mitral valve regurgitation. No evidence of mitral valve stenosis. Tricuspid Valve: The tricuspid valve is normal in structure. Tricuspid valve regurgitation is trivial. Aortic Valve: The aortic valve was not well visualized. Aortic valve regurgitation is not visualized. No aortic stenosis is present. Pulmonic Valve: The pulmonic valve was grossly normal. Pulmonic valve regurgitation is trivial. No evidence of pulmonic stenosis. Aorta: The aortic root is normal in size and structure. Pulmonary Artery: The pulmonary artery is not well seen. Venous: The inferior vena cava was not well visualized. IAS/Shunts: The interatrial septum was not well visualized.  LEFT VENTRICLE PLAX 2D LVIDd:         3.92 cm  Diastology LVIDs:         2.43 cm  LV e' medial:  8.49 cm/s LV PW:          0.91 cm  LV e' lateral: 11.00 cm/s LV IVS:        0.66 cm LVOT diam:     1.90 cm LVOT Area:     2.84 cm  RIGHT VENTRICLE         IVC TAPSE (M-mode): 2.8 cm  IVC diam: 1.73 cm LEFT ATRIUM             Index       RIGHT ATRIUM           Index LA diam:        3.90 cm 1.89 cm/m  RA Area:     13.50 cm LA Vol (A2C):   65.8 ml 31.81 ml/m RA Volume:   32.90 ml  15.91 ml/m LA Vol (A4C):   47.4 ml 22.92 ml/m LA Biplane Vol: 57.0 ml 27.56 ml/m   AORTA Ao Root diam: 3.00 cm  SHUNTS Systemic Diam: 1.90 cm Nelva Bush MD Electronically signed by Nelva Bush MD Signature Date/Time: 12/09/2019/8:46:09 AM    Final         Scheduled Meds:  cholecalciferol  3,000 Units Oral Daily   enoxaparin (LOVENOX) injection  0.5 mg/kg Subcutaneous Q24H   estrogens-methylTEST  1 tablet Oral Daily   guaiFENesin  600 mg Oral BID   ipratropium-albuterol  3 mL Nebulization QID   methylPREDNISolone (SOLU-MEDROL) injection  40 mg Intravenous Q12H   potassium chloride  40 mEq Oral Once   Vitamin D (Ergocalciferol)  50,000 Units Oral Q7 days   Continuous Infusions:  azithromycin 250 mL/hr at 12/09/19 0432     LOS: 1 day    Time spent: over 30 min    Fayrene Helper, MD [facility]   To contact the attending provider between 7A-7P or the covering  provider during after hours 7P-7A, please log into the web site [email or web address] and access using universal [place] password for that web site. If you do not have the password, please call the hospital operator.  12/09/2019, 1:58 PM

## 2019-12-10 MED ORDER — PREDNISONE 20 MG PO TABS
40.0000 mg | ORAL_TABLET | Freq: Every day | ORAL | Status: DC
  Administered 2019-12-10 – 2019-12-11 (×2): 40 mg via ORAL
  Filled 2019-12-10 (×2): qty 2

## 2019-12-10 MED ORDER — PREDNISONE 20 MG PO TABS: 40.0000 mg | ORAL_TABLET | Freq: Every day | ORAL | Status: DC

## 2019-12-10 NOTE — Progress Notes (Addendum)
PROGRESS NOTE    Marie Rios  OXB:[id number] DOB: [birth year] DOA: 12/07/2019 PCP: Shelda Pal, DO   Brief Narrative:  BridgettPooleis a39 y.o.African-American femalewith a known history of asthma, anxiety/depression, fibromyalgia, seizure and migraine, who presented to the ED with worsening SOB and cough over the past week.  She initially improved with steroid therapy, but worsened again after she completed this so she presented to the hospital.  She's currently being treated for an asthma exacerbation with steroids and scheduled and prn nebs.   Assessment & Plan:  Acute hypoxic respiratory failure. This could be secondary to acute bronchitis with bronchospasm and subsequent asthma exacerbation. - CT PE protocol without PE, mild atelectasis  - CXR 11/9 without active disease, COVID-19 and flu: Negative. -Respiratory panel positive for rhinovirus.  DC azithromycin. -Doppler ultrasound of bilateral lower extremity: Negative for acute findings. -scheduled and prn nebs -Initial troponin negative, BMP: WNL. -Changed IV steroids to p.o. prednisone 40 mg daily today. -TSH: WNL, echo with normal EF, indeterminate diastolic parameters -Vital signs stable, afebrile, currently on room air.  Leukocytosis: Likely in the setting of steroids. -Patient is afebrile.  Chest x-ray negative for pneumonia.  COVID-19 negative. -Repeat CBC tomorrow a.m.    Migraine headaches. -We will continue her triptan's therapy.  Depression/anxiety: Continue home dose of trazodone  Morbid obesity with BMI of 39: Diet modification/exercise and weight loss recommended.  OSA: Continue CPAP  DVT prophylaxis: Lovenox/SCD Code Status: Full code Family Communication: None present at bedside.  Plan of care discussed with patient in length and she verbalized understanding and agreed with it. Disposition Plan: Home likely tomorrow  Consultants:  None  Procedures:   Echo  CTA  chest  Antimicrobials:   Azithromycin  Status is: Inpatient  Dispo: The patient is from: Home              Anticipated d/c is to: Home              Anticipated d/c date is: 12/11/2019              Patient currently not medically stable for the discharge.   Subjective: Patient seen and examined.  Sitting comfortably on the bed.  Tells me that she continues to have chest tightness however her symptoms has improved but not resolved completely.  Denies wheezing, leg swelling, orthopnea, PND, fever or chills.  He seems very stressed out/anxious.  Tells me that she is not comfortable going home today.  Objective: Vitals:   12/09/19 2348 12/10/19 0408 12/10/19 0740 12/10/19 1048  BP: 125/60 125/70 (!) 114/59   Pulse: 68 64 78   Resp: 18 18 16    Temp: 98 F (36.7 C) 97.8 F (36.6 C) 98 F (36.7 C)   TempSrc: Oral Oral Oral   SpO2: 97% 94% 100% 98%  Weight:      Height:        Intake/Output Summary (Last 24 hours) at 12/10/2019 1228 Last data filed at 12/09/2019 1400 Gross per 24 hour  Intake 480 ml  Output --  Net 480 ml   Filed Weights   12/07/19 1931  Weight: 103.4 kg    Examination:  General exam: Appears calm and comfortable  Respiratory system: Clear to auscultation. Respiratory effort normal. Cardiovascular system: S1 & S2 heard, RRR. No JVD, murmurs, rubs, gallops or clicks. No pedal edema. Gastrointestinal system: Abdomen is nondistended, soft and nontender. No organomegaly or masses felt. Normal bowel sounds heard. Central nervous system: Alert and oriented. No  focal neurological deficits. Extremities: Symmetric 5 x 5 power. Skin: No rashes, lesions or ulcers Psychiatry: Judgement and insight appear normal. Mood & affect appropriate.    Data Reviewed: I have personally reviewed following labs and imaging studies  CBC: Recent Labs  Lab 12/07/19 1941 12/08/19 0528 12/09/19 0609  WBC 9.7 10.8* 10.6*  NEUTROABS  --   --  7.7  HGB 13.5 12.9 12.6  HCT  40.6 38.2 36.9  MCV 83.9 82.9 82.9  PLT 276 266 704   Basic Metabolic Panel: Recent Labs  Lab 12/07/19 1941 12/08/19 0528 12/09/19 0609  NA 138 135 140  K 3.3* 3.9 4.0  CL 101 101 109  CO2 26 25 24   GLUCOSE 105* 160* 103*  BUN 13 12 10   CREATININE 0.85 0.66 0.57  CALCIUM 8.7* 8.6* 8.3*  MG  --   --  2.1  PHOS  --   --  2.7   GFR: Estimated Creatinine Clearance: 110.6 mL/min (by C-G formula based on SCr of 0.57 mg/dL). Liver Function Tests: Recent Labs  Lab 12/09/19 0609  AST 17  ALT 29  ALKPHOS 48  BILITOT 0.7  PROT 6.7  ALBUMIN 3.3*   No results for input(s): LIPASE, AMYLASE in the last 168 hours. No results for input(s): AMMONIA in the last 168 hours. Coagulation Profile: No results for input(s): INR, PROTIME in the last 168 hours. Cardiac Enzymes: No results for input(s): CKTOTAL, CKMB, CKMBINDEX, TROPONINI in the last 168 hours. BNP (last 3 results) No results for input(s): PROBNP in the last 8760 hours. HbA1C: No results for input(s): HGBA1C in the last 72 hours. CBG: No results for input(s): GLUCAP in the last 168 hours. Lipid Profile: No results for input(s): CHOL, HDL, LDLCALC, TRIG, CHOLHDL, LDLDIRECT in the last 72 hours. Thyroid Function Tests: Recent Labs    12/09/19 0609  TSH 0.664   Anemia Panel: No results for input(s): VITAMINB12, FOLATE, FERRITIN, TIBC, IRON, RETICCTPCT in the last 72 hours. Sepsis Labs: No results for input(s): PROCALCITON, LATICACIDVEN in the last 168 hours.  Recent Results (from the past 240 hour(s))  Respiratory Panel by RT PCR (Flu A&B, Covid) - Nasopharyngeal Swab     Status: None   Collection Time: 12/07/19 11:47 PM   Specimen: Nasopharyngeal Swab  Result Value Ref Range Status   SARS Coronavirus 2 by RT PCR NEGATIVE NEGATIVE Final    Comment: (NOTE) SARS-CoV-2 target nucleic acids are NOT DETECTED.  The SARS-CoV-2 RNA is generally detectable in upper respiratoy specimens during the acute phase of infection.  The lowest concentration of SARS-CoV-2 viral copies this assay can detect is 131 copies/mL. A negative result does not preclude SARS-Cov-2 infection and should not be used as the sole basis for treatment or other patient management decisions. A negative result may occur with  improper specimen collection/handling, submission of specimen other than nasopharyngeal swab, presence of viral mutation(s) within the areas targeted by this assay, and inadequate number of viral copies (<131 copies/mL). A negative result must be combined with clinical observations, patient history, and epidemiological information. The expected result is Negative.  Fact Sheet for Patients:  PinkCheek.be  Fact Sheet for Healthcare Providers:  GravelBags.it  This test is no t yet approved or cleared by the [place] FDA and  has been authorized for detection and/or diagnosis of SARS-CoV-2 by FDA under an Emergency Use Authorization (EUA). This EUA will remain  in effect (meaning this test can be used) for the duration of the COVID-19 declaration under  Section 564(b)(1) of the Act, 21 U.S.C. section 360bbb-3(b)(1), unless the authorization is terminated or revoked sooner.     Influenza A by PCR NEGATIVE NEGATIVE Final   Influenza B by PCR NEGATIVE NEGATIVE Final    Comment: (NOTE) The Xpert Xpress SARS-CoV-2/FLU/RSV assay is intended as an aid in  the diagnosis of influenza from Nasopharyngeal swab specimens and  should not be used as a sole basis for treatment. Nasal washings and  aspirates are unacceptable for Xpert Xpress SARS-CoV-2/FLU/RSV  testing.  Fact Sheet for Patients: PinkCheek.be  Fact Sheet for Healthcare Providers: GravelBags.it  This test is not yet approved or cleared by the [place] FDA and  has been authorized for detection and/or diagnosis of SARS-CoV-2 by  FDA  under an Emergency Use Authorization (EUA). This EUA will remain  in effect (meaning this test can be used) for the duration of the  Covid-19 declaration under Section 564(b)(1) of the Act, 21  U.S.C. section 360bbb-3(b)(1), unless the authorization is  terminated or revoked. Performed at [facility], [address] [place], [place] [address]   Respiratory Panel by PCR     Status: Abnormal   Collection Time: 12/09/19  3:20 PM   Specimen: Nasopharyngeal Swab; Respiratory  Result Value Ref Range Status   Adenovirus NOT DETECTED NOT DETECTED Final   Coronavirus 229E NOT DETECTED NOT DETECTED Final    Comment: (NOTE) The Coronavirus on the Respiratory Panel, DOES NOT test for the novel  Coronavirus (2019 nCoV)    Coronavirus HKU1 NOT DETECTED NOT DETECTED Final   Coronavirus NL63 NOT DETECTED NOT DETECTED Final   Coronavirus OC43 NOT DETECTED NOT DETECTED Final   Metapneumovirus NOT DETECTED NOT DETECTED Final   Rhinovirus / Enterovirus DETECTED (A) NOT DETECTED Final   Influenza A NOT DETECTED NOT DETECTED Final   Influenza B NOT DETECTED NOT DETECTED Final   Parainfluenza Virus 1 NOT DETECTED NOT DETECTED Final   Parainfluenza Virus 2 NOT DETECTED NOT DETECTED Final   Parainfluenza Virus 3 NOT DETECTED NOT DETECTED Final   Parainfluenza Virus 4 NOT DETECTED NOT DETECTED Final   Respiratory Syncytial Virus NOT DETECTED NOT DETECTED Final   Bordetella pertussis NOT DETECTED NOT DETECTED Final   Chlamydophila pneumoniae NOT DETECTED NOT DETECTED Final   Mycoplasma pneumoniae NOT DETECTED NOT DETECTED Final    Comment: Performed at [facility], [place]. [address], [place], [place] [address]      Radiology Studies: US Venous Img Lower Bilateral (DVT)  Result Date: 12/09/2019 CLINICAL DATA:  Shortness of breath. EXAM: BILATERAL LOWER EXTREMITY VENOUS DOPPLER ULTRASOUND TECHNIQUE: Gray-scale sonography with graded compression, as well as color Doppler and duplex  ultrasound were performed to evaluate the lower extremity deep venous systems from the level of the common femoral vein and including the common femoral, femoral, profunda femoral, popliteal and calf veins including the posterior tibial, peroneal and gastrocnemius veins when visible. The superficial great saphenous vein was also interrogated. Spectral Doppler was utilized to evaluate flow at rest and with distal augmentation maneuvers in the common femoral, femoral and popliteal veins. COMPARISON:  None. FINDINGS: RIGHT LOWER EXTREMITY Common Femoral Vein: No evidence of thrombus. Normal compressibility, respiratory phasicity and response to augmentation. Saphenofemoral Junction: No evidence of thrombus. Normal compressibility and flow on color Doppler imaging. Profunda Femoral Vein: No evidence of thrombus. Normal compressibility and flow on color Doppler imaging. Femoral Vein: No evidence of thrombus. Normal compressibility, respiratory phasicity and response to augmentation. Popliteal Vein: No evidence of  thrombus. Normal compressibility, respiratory phasicity and response to augmentation. Calf Veins: No evidence of thrombus. Normal compressibility and flow on color Doppler imaging. Superficial Great Saphenous Vein: No evidence of thrombus. Normal compressibility. Venous Reflux:  None. Other Findings: No evidence of superficial thrombophlebitis or abnormal fluid collection. LEFT LOWER EXTREMITY Common Femoral Vein: No evidence of thrombus. Normal compressibility, respiratory phasicity and response to augmentation. Saphenofemoral Junction: No evidence of thrombus. Normal compressibility and flow on color Doppler imaging. Profunda Femoral Vein: No evidence of thrombus. Normal compressibility and flow on color Doppler imaging. Femoral Vein: No evidence of thrombus. Normal compressibility, respiratory phasicity and response to augmentation. Popliteal Vein: No evidence of thrombus. Normal compressibility, respiratory  phasicity and response to augmentation. Calf Veins: No evidence of thrombus. Normal compressibility and flow on color Doppler imaging. Superficial Great Saphenous Vein: No evidence of thrombus. Normal compressibility. Venous Reflux:  None. Other Findings: No evidence of superficial thrombophlebitis or abnormal fluid collection. IMPRESSION: No evidence of deep venous thrombosis in either lower extremity. Electronically Signed   By: Aletta Edouard M.D.   On: 12/09/2019 15:16   DG Chest Port 1 View  Result Date: 12/09/2019 CLINICAL DATA:  Short of breath EXAM: PORTABLE CHEST 1 VIEW COMPARISON:  12/07/2019 FINDINGS: The heart size and mediastinal contours are within normal limits. Both lungs are clear. The visualized skeletal structures are unremarkable. IMPRESSION: No active disease. Electronically Signed   By: Franchot Gallo M.D.   On: 12/09/2019 07:56   ECHOCARDIOGRAM COMPLETE  Result Date: 12/09/2019    ECHOCARDIOGRAM REPORT   Patient Name:   Marie Rios Date of Exam: 12/08/2019 Medical Rec #:  [id number]        Height:       64.0 in Accession #:    [id number]       Weight:       228.0 lb Date of Birth:  08-10-1980       BSA:          2.068 m Patient Age:    4 years         BP:           108/62 mmHg Patient Gender: F                HR:           93 bpm. Exam Location:  [facility] Procedure: 2D Echo Indications:     Dyspnea 786.09 / R06.00  History:         Patient has no prior history of Echocardiogram examinations.                  Signs/Symptoms:Shortness of Breath.  Sonographer:     Avanell Shackleton Referring Phys:  [id number] Arvella Merles MANSY Diagnosing Phys: Nelva Bush MD IMPRESSIONS  1. Left ventricular ejection fraction, by estimation, is 65 to 70%. The left ventricle has normal function. Left ventricular endocardial border not optimally defined to evaluate regional wall motion. Left ventricular diastolic parameters are indeterminate.  2. Right ventricular systolic function is normal. The right ventricular  size is mildly enlarged. Tricuspid regurgitation signal is inadequate for assessing PA pressure.  3. The mitral valve is normal in structure. No evidence of mitral valve regurgitation. No evidence of mitral stenosis.  4. The aortic valve was not well visualized. Aortic valve regurgitation is not visualized. No aortic stenosis is present. FINDINGS  Left Ventricle: Left ventricular ejection fraction, by estimation, is 65 to 70%. The left ventricle has normal function. Left ventricular  endocardial border not optimally defined to evaluate regional wall motion. The left ventricular internal cavity size was normal in size. There is no left ventricular hypertrophy. Left ventricular diastolic parameters are indeterminate. Right Ventricle: The right ventricular size is mildly enlarged. No increase in right ventricular wall thickness. Right ventricular systolic function is normal. Tricuspid regurgitation signal is inadequate for assessing PA pressure. Left Atrium: Left atrial size was normal in size. Right Atrium: Right atrial size was normal in size. Pericardium: The pericardium was not well visualized. Mitral Valve: The mitral valve is normal in structure. No evidence of mitral valve regurgitation. No evidence of mitral valve stenosis. Tricuspid Valve: The tricuspid valve is normal in structure. Tricuspid valve regurgitation is trivial. Aortic Valve: The aortic valve was not well visualized. Aortic valve regurgitation is not visualized. No aortic stenosis is present. Pulmonic Valve: The pulmonic valve was grossly normal. Pulmonic valve regurgitation is trivial. No evidence of pulmonic stenosis. Aorta: The aortic root is normal in size and structure. Pulmonary Artery: The pulmonary artery is not well seen. Venous: The inferior vena cava was not well visualized. IAS/Shunts: The interatrial septum was not well visualized.  LEFT VENTRICLE PLAX 2D LVIDd:         3.92 cm  Diastology LVIDs:         2.43 cm  LV e' medial:  8.49  cm/s LV PW:         0.91 cm  LV e' lateral: 11.00 cm/s LV IVS:        0.66 cm LVOT diam:     1.90 cm LVOT Area:     2.84 cm  RIGHT VENTRICLE         IVC TAPSE (M-mode): 2.8 cm  IVC diam: 1.73 cm LEFT ATRIUM             Index       RIGHT ATRIUM           Index LA diam:        3.90 cm 1.89 cm/m  RA Area:     13.50 cm LA Vol (A2C):   65.8 ml 31.81 ml/m RA Volume:   32.90 ml  15.91 ml/m LA Vol (A4C):   47.4 ml 22.92 ml/m LA Biplane Vol: 57.0 ml 27.56 ml/m   AORTA Ao Root diam: 3.00 cm  SHUNTS Systemic Diam: 1.90 cm Nelva Bush MD Electronically signed by Nelva Bush MD Signature Date/Time: 12/09/2019/8:46:09 AM    Final     Scheduled Meds: . cholecalciferol  3,000 Units Oral Daily  . enoxaparin (LOVENOX) injection  0.5 mg/kg Subcutaneous Q24H  . estrogens-methylTEST  1 tablet Oral Daily  . guaiFENesin  600 mg Oral BID  . ipratropium-albuterol  3 mL Nebulization QID  . [START ON 12/11/2019] predniSONE  40 mg Oral Q breakfast  . Vitamin D (Ergocalciferol)  50,000 Units Oral Q7 days   Continuous Infusions: . azithromycin 500 mg (12/10/19 0233)     LOS: 2 days   Time spent: 35 minutes   Jazleen Robeck Loann Quill, MD [facility]  If 7PM-7AM, please contact night-coverage [email or web address] 12/10/2019, 12:28 PM

## 2019-12-11 ENCOUNTER — Other Ambulatory Visit: Payer: Self-pay | Admitting: Internal Medicine

## 2019-12-11 DIAGNOSIS — R0602 Shortness of breath: Secondary | ICD-10-CM

## 2019-12-11 LAB — CBC
HCT: 38 % (ref 36.0–46.0)
Hemoglobin: 12.6 g/dL (ref 12.0–15.0)
MCH: 28.3 pg (ref 26.0–34.0)
MCHC: 33.2 g/dL (ref 30.0–36.0)
MCV: 85.2 fL (ref 80.0–100.0)
Platelets: 263 10*3/uL (ref 150–400)
RBC: 4.46 MIL/uL (ref 3.87–5.11)
RDW: 13.2 % (ref 11.5–15.5)
WBC: 11.9 10*3/uL — ABNORMAL HIGH (ref 4.0–10.5)
nRBC: 0 % (ref 0.0–0.2)

## 2019-12-11 LAB — BASIC METABOLIC PANEL
Anion gap: 7 (ref 5–15)
BUN: 19 mg/dL (ref 6–20)
CO2: 26 mmol/L (ref 22–32)
Calcium: 8.9 mg/dL (ref 8.9–10.3)
Chloride: 106 mmol/L (ref 98–111)
Creatinine, Ser: 0.77 mg/dL (ref 0.44–1.00)
GFR, Estimated: >60 mL/min (ref >60)
Glucose, Bld: 112 mg/dL — ABNORMAL HIGH (ref 70–99)
Potassium: 4 mmol/L (ref 3.5–5.1)
Sodium: 139 mmol/L (ref 135–145)

## 2019-12-11 MED ORDER — PREDNISONE 20 MG PO TABS: 40.0000 mg | ORAL_TABLET | Freq: Every day | ORAL | 0 refills | Status: DC

## 2019-12-11 MED ORDER — ALBUTEROL SULFATE HFA 108 (90 BASE) MCG/ACT IN AERS
2.0000 | INHALATION_SPRAY | Freq: Four times a day (QID) | RESPIRATORY_TRACT | 2 refills | Status: AC | PRN
Start: 2019-12-11 — End: ?

## 2019-12-11 NOTE — Discharge Summary (Signed)
Physician Discharge Summary  Marie Rios JKD:[id number] DOB: July 11, 1980 DOA: 12/07/2019  PCP: Shelda Pal, DO  Admit date: 12/07/2019 Discharge date: 12/11/2019  Admitted From: home Disposition:  Home  Recommendations for Outpatient Follow-up:  1. Follow-up with PCP on 12/15/2019. 2. Repeat CBC on follow-up visit. 3. Continue prednisone for 3 more days. 4. Albuterol as needed for shortness of breath and wheezing 5. Continue CPAP at bedtime  Home Health:none Equipment/Devices: none Discharge Condition: Stable CODE STATUS: Full code Diet recommendation: Heart healthy diet  Brief/Interim Summary: BridgettPooleis a39 y.o.African-American femalewith a known history of asthma, anxiety/depression, fibromyalgia, seizure and migraine,who presented to the ED with worsening SOB and cough over the past week. She initially improved with steroid therapy, but worsened again after she completed this so she presented to the hospital.   Acute hypoxic respiratory failure. Likely secondary to acute viral bronchitis with bronchospasm and subsequent asthma exacerbation. -CT PE protocol without PE, mild atelectasis  -CXR 11/9 without active disease, COVID-19 and flu: Negative. -Respiratory panel positive for rhinovirus/enterovirus.  -Doppler ultrasound of bilateral lower extremity: Negative for acute findings. -Initial troponin negative, BMP: WNL. -TSH: WNL, echo with normal EF, indeterminate diastolic parameters -Patient started on IV azithromycin, scheduled and prn nebs and IV steroids which was switched to p.o. prednisone on 12/11/19.  Azithromycin was discontinued on 12/11/2019. -Vitals remained stable.  She remained afebrile. -Maintaining oxygen saturation on room air -We will discharge patient on prednisone 40 mg daily for 3 more days.  Leukocytosis: Likely in the setting of steroids. -Improved.  Patient remained afebrile.  Chest x-ray negative for pneumonia.  COVID-19  negative.  Migraine headaches. -Continued home dose of triptan  Morbid obesity with BMI of 39: Diet modification/exercise and weight loss recommended.  OSA: Continue CPAP  Discharge Diagnoses:  Acute hypoxemic respiratory failure secondary to acute viral bronchitis with bronchospasm and subsequent asthma exacerbation Leukocytosis Migraine headache Morbid obesity with BMI of 39 OSA  Discharge Instructions  Discharge Instructions    Diet - low sodium heart healthy   Complete by: As directed    Discharge instructions   Complete by: As directed    Follow-up with PCP on 12/15/2019. Repeat CBC on follow-up visit. Continue prednisone for 3 more days. Albuterol as needed for shortness of breath and wheezing Continue CPAP at bedtime   Increase activity slowly   Complete by: As directed      Allergies as of 12/11/2019      Reactions   Gabapentin    Intolerant.    Keflex [cephalexin]    Reflux   Latex Other (See Comments)   Rash   Ciprofloxacin Rash      Medication List    TAKE these medications   albuterol 108 (90 Base) MCG/ACT inhaler Commonly known as: VENTOLIN HFA Inhale 2 puffs into the lungs every 6 (six) hours as needed for wheezing or shortness of breath.   brompheniramine-pseudoephedrine-DM 30-2-10 MG/5ML syrup Take 10 mLs by mouth every 4 (four) hours as needed.   Emgality 120 MG/ML Soaj Generic drug: Galcanezumab-gnlm INJECT 120 MG INTO THE SKIN EVERY 30 (THIRTY) DAYS.   estrogens-methylTEST 1.25-2.5 MG tablet Commonly known as: ESTRATEST Take 1 tablet by mouth daily.   guaiFENesin 600 MG 12 hr tablet Commonly known as: MUCINEX Take 600 mg by mouth 2 (two) times daily.   ipratropium 0.03 % nasal spray Commonly known as: ATROVENT Place 2 sprays into both nostrils daily.   ondansetron 4 MG disintegrating tablet Commonly known as: ZOFRAN-ODT Take 1 tablet (  4 mg total) by mouth every 8 (eight) hours as needed for nausea or vomiting.    PEPTO-BISMOL PO Take 1 tablet by mouth as needed.   predniSONE 20 MG tablet Commonly known as: DELTASONE Take 2 tablets (40 mg total) by mouth daily with breakfast for 3 days. Start taking on: December 12, 2019   rizatriptan 10 MG tablet Commonly known as: MAXALT Take 1 tablet (10 mg total) by mouth as needed for migraine. May repeat in 2 hours if needed. Max 2 doses in 24 hours. Appointment needed.   Vitamin D (Ergocalciferol) 1.25 MG (50000 UNIT) Caps capsule Commonly known as: DRISDOL Take 1 capsule (50,000 Units total) by mouth every 7 (seven) days. What changed: additional instructions   Vitamin D3 25 MCG tablet Commonly known as: Vitamin D Take 3,000 Units by mouth daily.       Follow-up Information    Shelda Pal, DO On 12/15/2019.   Specialty: Family Medicine Why: at [facility] information: [address] [place] [address] [phone]              Allergies  Allergen Reactions  . Gabapentin     Intolerant.   . Keflex [Cephalexin]     Reflux  . Latex Other (See Comments)    Rash  . Ciprofloxacin Rash    Consultations:     Procedures/Studies: DG Chest 2 View  Result Date: 12/07/2019 CLINICAL DATA:  Shortness of breath cough, sore throat and congestion, worsening for the past week, negative COVID-19 testing x2, clinical diagnosis of bronchitis. EXAM: CHEST - 2 VIEW COMPARISON:  None. FINDINGS: Some mild airways thickening may be present compatible with a bronchitis. No focal consolidative process. No pneumothorax or effusion. No convincing features of edema. The cardiomediastinal contours are unremarkable. No acute osseous or soft tissue abnormality. IMPRESSION: Mild airways thickening, can be seen in the setting of bronchitis. No consolidative airspace disease. Electronically Signed   By: Lovena Le M.D.   On: 12/07/2019 20:01   CT Angio Chest PE W and/or Wo Contrast  Result Date: 12/07/2019 CLINICAL DATA:   Shortness of breath, cough, sore throat and congestion EXAM: CT ANGIOGRAPHY CHEST WITH CONTRAST TECHNIQUE: Multidetector CT imaging of the chest was performed using the standard protocol during bolus administration of intravenous contrast. Multiplanar CT image reconstructions and MIPs were obtained to evaluate the vascular anatomy. CONTRAST:  185mL OMNIPAQUE IOHEXOL 350 MG/ML SOLN COMPARISON:  Chest radiograph 12/07/2019 FINDINGS: Cardiovascular: Satisfactory opacification the pulmonary arteries to the segmental level. No pulmonary artery filling defects are identified. Central pulmonary arteries are normal caliber. The aortic root is suboptimally assessed given cardiac pulsation artifact. The aorta is normal caliber. No acute luminal abnormality of the imaged aorta. A small ductus bump is a benign anatomic variant (series 11, image 66). No periaortic stranding or hemorrhage. Normal 3 vessel branching of the aortic arch. Proximal great vessels are unremarkable. Mediastinum/Nodes: No mediastinal fluid or gas. Normal thyroid gland and thoracic inlet. No acute abnormality of the trachea or esophagus. No worrisome mediastinal, hilar or axillary adenopathy. Lungs/Pleura: No consolidation, features of edema, pneumothorax, or effusion. Dependent atelectatic changes are present likely accentuated by imaging during exhalation for the angiographic technique. No suspicious pulmonary nodules or masses. Upper Abdomen: No acute abnormalities present in the visualized portions of the upper abdomen. Contrast material within the collecting system likely from the tracking bolus. Post cholecystectomy. Few subcentimeter hypoattenuating foci present in the anterior left lobe liver (7/69, 71) too small  to fully characterize on CT imaging but statistically likely benign in the absence of malignancy or known risk factor. Musculoskeletal: No acute osseous abnormality or suspicious osseous lesion. Multilevel degenerative changes are present  in the imaged portions of the spine. No worrisome chest wall soft tissue abnormality is seen. Review of the MIP images confirms the above findings. IMPRESSION: 1. No evidence of pulmonary embolism. 2. Mild atelectasis without other acute intrathoracic process. 3. Patient noted discomfort at the IV access site following injection. Patient was assessed by the CT technologist with findings relayed to the interpreting physician. Please see description below. CONTRAST EXTRAVASATION CONSULTATION: Type of contrast:  Omnipaque 350 Site of extravasation: Left are Estimated volume of extravasation: <20 ml Area of extravasation scanned with CT? no PATIENT'S SIGNS AND SYMPTOMS Patient was assessed by CT technologist staff given remote site of operations. Skin blistering/ulceration: no Decrease capillary refill: no Change in skin color: no Decreased motor function or severe tightness: no Decreased pulses distal to site of extravasation: no Altered sensation: no Increasing pain or signs of increased swelling during observation: no TREATMENT Limb elevation: yes Heat pads applied: yes Plastic surgery consulted? no DOCUMENTATION AND FOLLOW-UP Patient's questions answered? yes Patient care team nursing staff informed. These results and discussion of extravasation were called by telephone at the time of report submission on 12/07/2019 at 10:25 pm to provider [facility] , who verbally acknowledged these results. Electronically Signed   By: Lovena Le M.D.   On: 12/07/2019 22:27   US Venous Img Lower Bilateral (DVT)  Result Date: 12/09/2019 CLINICAL DATA:  Shortness of breath. EXAM: BILATERAL LOWER EXTREMITY VENOUS DOPPLER ULTRASOUND TECHNIQUE: Gray-scale sonography with graded compression, as well as color Doppler and duplex ultrasound were performed to evaluate the lower extremity deep venous systems from the level of the common femoral vein and including the common femoral, femoral, profunda femoral, popliteal and calf veins  including the posterior tibial, peroneal and gastrocnemius veins when visible. The superficial great saphenous vein was also interrogated. Spectral Doppler was utilized to evaluate flow at rest and with distal augmentation maneuvers in the common femoral, femoral and popliteal veins. COMPARISON:  None. FINDINGS: RIGHT LOWER EXTREMITY Common Femoral Vein: No evidence of thrombus. Normal compressibility, respiratory phasicity and response to augmentation. Saphenofemoral Junction: No evidence of thrombus. Normal compressibility and flow on color Doppler imaging. Profunda Femoral Vein: No evidence of thrombus. Normal compressibility and flow on color Doppler imaging. Femoral Vein: No evidence of thrombus. Normal compressibility, respiratory phasicity and response to augmentation. Popliteal Vein: No evidence of thrombus. Normal compressibility, respiratory phasicity and response to augmentation. Calf Veins: No evidence of thrombus. Normal compressibility and flow on color Doppler imaging. Superficial Great Saphenous Vein: No evidence of thrombus. Normal compressibility. Venous Reflux:  None. Other Findings: No evidence of superficial thrombophlebitis or abnormal fluid collection. LEFT LOWER EXTREMITY Common Femoral Vein: No evidence of thrombus. Normal compressibility, respiratory phasicity and response to augmentation. Saphenofemoral Junction: No evidence of thrombus. Normal compressibility and flow on color Doppler imaging. Profunda Femoral Vein: No evidence of thrombus. Normal compressibility and flow on color Doppler imaging. Femoral Vein: No evidence of thrombus. Normal compressibility, respiratory phasicity and response to augmentation. Popliteal Vein: No evidence of thrombus. Normal compressibility, respiratory phasicity and response to augmentation. Calf Veins: No evidence of thrombus. Normal compressibility and flow on color Doppler imaging. Superficial Great Saphenous Vein: No evidence of thrombus. Normal  compressibility. Venous Reflux:  None. Other Findings: No evidence of superficial thrombophlebitis or abnormal fluid collection. IMPRESSION:  No evidence of deep venous thrombosis in either lower extremity. Electronically Signed   By: Aletta Edouard M.D.   On: 12/09/2019 15:16   DG Chest Port 1 View  Result Date: 12/09/2019 CLINICAL DATA:  Short of breath EXAM: PORTABLE CHEST 1 VIEW COMPARISON:  12/07/2019 FINDINGS: The heart size and mediastinal contours are within normal limits. Both lungs are clear. The visualized skeletal structures are unremarkable. IMPRESSION: No active disease. Electronically Signed   By: Franchot Gallo M.D.   On: 12/09/2019 07:56   ECHOCARDIOGRAM COMPLETE  Result Date: 12/09/2019    ECHOCARDIOGRAM REPORT   Patient Name:   Marie Rios Date of Exam: 12/08/2019 Medical Rec #:  [id number]        Height:       64.0 in Accession #:    [id number]       Weight:       228.0 lb Date of Birth:  Aug 23, 1980       BSA:          2.068 m Patient Age:    39 years         BP:           108/62 mmHg Patient Gender: F                HR:           93 bpm. Exam Location:  [facility] Procedure: 2D Echo Indications:     Dyspnea 786.09 / R06.00  History:         Patient has no prior history of Echocardiogram examinations.                  Signs/Symptoms:Shortness of Breath.  Sonographer:     Avanell Shackleton Referring Phys:  [id number] Arvella Merles MANSY Diagnosing Phys: Nelva Bush MD IMPRESSIONS  1. Left ventricular ejection fraction, by estimation, is 65 to 70%. The left ventricle has normal function. Left ventricular endocardial border not optimally defined to evaluate regional wall motion. Left ventricular diastolic parameters are indeterminate.  2. Right ventricular systolic function is normal. The right ventricular size is mildly enlarged. Tricuspid regurgitation signal is inadequate for assessing PA pressure.  3. The mitral valve is normal in structure. No evidence of mitral valve regurgitation. No evidence of  mitral stenosis.  4. The aortic valve was not well visualized. Aortic valve regurgitation is not visualized. No aortic stenosis is present. FINDINGS  Left Ventricle: Left ventricular ejection fraction, by estimation, is 65 to 70%. The left ventricle has normal function. Left ventricular endocardial border not optimally defined to evaluate regional wall motion. The left ventricular internal cavity size was normal in size. There is no left ventricular hypertrophy. Left ventricular diastolic parameters are indeterminate. Right Ventricle: The right ventricular size is mildly enlarged. No increase in right ventricular wall thickness. Right ventricular systolic function is normal. Tricuspid regurgitation signal is inadequate for assessing PA pressure. Left Atrium: Left atrial size was normal in size. Right Atrium: Right atrial size was normal in size. Pericardium: The pericardium was not well visualized. Mitral Valve: The mitral valve is normal in structure. No evidence of mitral valve regurgitation. No evidence of mitral valve stenosis. Tricuspid Valve: The tricuspid valve is normal in structure. Tricuspid valve regurgitation is trivial. Aortic Valve: The aortic valve was not well visualized. Aortic valve regurgitation is not visualized. No aortic stenosis is present. Pulmonic Valve: The pulmonic valve was grossly normal. Pulmonic valve regurgitation is trivial. No evidence of pulmonic stenosis. Aorta: The aortic  root is normal in size and structure. Pulmonary Artery: The pulmonary artery is not well seen. Venous: The inferior vena cava was not well visualized. IAS/Shunts: The interatrial septum was not well visualized.  LEFT VENTRICLE PLAX 2D LVIDd:         3.92 cm  Diastology LVIDs:         2.43 cm  LV e' medial:  8.49 cm/s LV PW:         0.91 cm  LV e' lateral: 11.00 cm/s LV IVS:        0.66 cm LVOT diam:     1.90 cm LVOT Area:     2.84 cm  RIGHT VENTRICLE         IVC TAPSE (M-mode): 2.8 cm  IVC diam: 1.73 cm LEFT  ATRIUM             Index       RIGHT ATRIUM           Index LA diam:        3.90 cm 1.89 cm/m  RA Area:     13.50 cm LA Vol (A2C):   65.8 ml 31.81 ml/m RA Volume:   32.90 ml  15.91 ml/m LA Vol (A4C):   47.4 ml 22.92 ml/m LA Biplane Vol: 57.0 ml 27.56 ml/m   AORTA Ao Root diam: 3.00 cm  SHUNTS Systemic Diam: 1.90 cm Nelva Bush MD Electronically signed by Nelva Bush MD Signature Date/Time: 12/09/2019/8:46:09 AM    Final       Subjective: Patient seen and examined.  Sitting comfortably on the bed.  On room air.  Tells me that she continues to feel the same chest tightness & dyspnea.  Denies chest pain, wheezing, fever, chills, nausea, vomiting, leg swelling, orthopnea or PND.  She denies known history of anxiety/depression and does not take any medications for it at home.  Discussed that her symptoms are likely related to viral bronchitis-her work-up including echo, DVT of lower extremities, chest x-ray, TSH, BNP, troponin  all came back within normal limits.  I answered all her questions. She agreed to go home today.  Discharge Exam: Vitals:   12/11/19 0350 12/11/19 0831  BP: (!) 104/54 129/82  Pulse: 70 79  Resp: 17 17  Temp: 98.2 F (36.8 C) 98.5 F (36.9 C)  SpO2: 97% 99%   Vitals:   12/10/19 2108 12/11/19 0002 12/11/19 0350 12/11/19 0831  BP: 122/66 (!) 111/52 (!) 104/54 129/82  Pulse: 73 89 70 79  Resp:  16 17 17   Temp: 98.3 F (36.8 C) 98 F (36.7 C) 98.2 F (36.8 C) 98.5 F (36.9 C)  TempSrc: Oral Oral Oral Oral  SpO2: 97% 100% 97% 99%  Weight:      Height:        General: Pt is alert, awake, not in acute distress, obese, on room air Cardiovascular: RRR, S1/S2 +, no rubs, no gallops Respiratory: CTA bilaterally, no wheezing, no rhonchi Abdominal: Soft, NT, ND, bowel sounds + Extremities: no edema, no cyanosis    The results of significant diagnostics from this hospitalization (including imaging, microbiology, ancillary and laboratory) are listed below  for reference.     Microbiology: Recent Results (from the past 240 hour(s))  Respiratory Panel by RT PCR (Flu A&B, Covid) - Nasopharyngeal Swab     Status: None   Collection Time: 12/07/19 11:47 PM   Specimen: Nasopharyngeal Swab  Result Value Ref Range Status   SARS Coronavirus 2 by RT PCR NEGATIVE  NEGATIVE Final    Comment: (NOTE) SARS-CoV-2 target nucleic acids are NOT DETECTED.  The SARS-CoV-2 RNA is generally detectable in upper respiratoy specimens during the acute phase of infection. The lowest concentration of SARS-CoV-2 viral copies this assay can detect is 131 copies/mL. A negative result does not preclude SARS-Cov-2 infection and should not be used as the sole basis for treatment or other patient management decisions. A negative result may occur with  improper specimen collection/handling, submission of specimen other than nasopharyngeal swab, presence of viral mutation(s) within the areas targeted by this assay, and inadequate number of viral copies (<131 copies/mL). A negative result must be combined with clinical observations, patient history, and epidemiological information. The expected result is Negative.  Fact Sheet for Patients:  PinkCheek.be  Fact Sheet for Healthcare Providers:  GravelBags.it  This test is no t yet approved or cleared by the [place] FDA and  has been authorized for detection and/or diagnosis of SARS-CoV-2 by FDA under an Emergency Use Authorization (EUA). This EUA will remain  in effect (meaning this test can be used) for the duration of the COVID-19 declaration under Section 564(b)(1) of the Act, 21 U.S.C. section 360bbb-3(b)(1), unless the authorization is terminated or revoked sooner.     Influenza A by PCR NEGATIVE NEGATIVE Final   Influenza B by PCR NEGATIVE NEGATIVE Final    Comment: (NOTE) The Xpert Xpress SARS-CoV-2/FLU/RSV assay is intended as an aid in  the  diagnosis of influenza from Nasopharyngeal swab specimens and  should not be used as a sole basis for treatment. Nasal washings and  aspirates are unacceptable for Xpert Xpress SARS-CoV-2/FLU/RSV  testing.  Fact Sheet for Patients: PinkCheek.be  Fact Sheet for Healthcare Providers: GravelBags.it  This test is not yet approved or cleared by the [place] FDA and  has been authorized for detection and/or diagnosis of SARS-CoV-2 by  FDA under an Emergency Use Authorization (EUA). This EUA will remain  in effect (meaning this test can be used) for the duration of the  Covid-19 declaration under Section 564(b)(1) of the Act, 21  U.S.C. section 360bbb-3(b)(1), unless the authorization is  terminated or revoked. Performed at [facility], [address] [place], [place] [address]   Respiratory Panel by PCR     Status: Abnormal   Collection Time: 12/09/19  3:20 PM   Specimen: Nasopharyngeal Swab; Respiratory  Result Value Ref Range Status   Adenovirus NOT DETECTED NOT DETECTED Final   Coronavirus 229E NOT DETECTED NOT DETECTED Final    Comment: (NOTE) The Coronavirus on the Respiratory Panel, DOES NOT test for the novel  Coronavirus (2019 nCoV)    Coronavirus HKU1 NOT DETECTED NOT DETECTED Final   Coronavirus NL63 NOT DETECTED NOT DETECTED Final   Coronavirus OC43 NOT DETECTED NOT DETECTED Final   Metapneumovirus NOT DETECTED NOT DETECTED Final   Rhinovirus / Enterovirus DETECTED (A) NOT DETECTED Final   Influenza A NOT DETECTED NOT DETECTED Final   Influenza B NOT DETECTED NOT DETECTED Final   Parainfluenza Virus 1 NOT DETECTED NOT DETECTED Final   Parainfluenza Virus 2 NOT DETECTED NOT DETECTED Final   Parainfluenza Virus 3 NOT DETECTED NOT DETECTED Final   Parainfluenza Virus 4 NOT DETECTED NOT DETECTED Final   Respiratory Syncytial Virus NOT DETECTED NOT DETECTED Final   Bordetella pertussis NOT DETECTED  NOT DETECTED Final   Chlamydophila pneumoniae NOT DETECTED NOT DETECTED Final   Mycoplasma pneumoniae NOT DETECTED NOT DETECTED Final    Comment: Performed at [facility]  [facility], [place] [address], [place], [place] [address]     Labs: BNP (last 3 results) Recent Labs    12/07/19 1941  BNP 9.5   Basic Metabolic Panel: Recent Labs  Lab 12/07/19 1941 12/08/19 0528 12/09/19 0609 12/11/19 0453  NA 138 135 140 139  K 3.3* 3.9 4.0 4.0  CL 101 101 109 106  CO2 26 25 24 26   GLUCOSE 105* 160* 103* 112*  BUN 13 12 10 19   CREATININE 0.85 0.66 0.57 0.77  CALCIUM 8.7* 8.6* 8.3* 8.9  MG  --   --  2.1  --   PHOS  --   --  2.7  --    Liver Function Tests: Recent Labs  Lab 12/09/19 0609  AST 17  ALT 29  ALKPHOS 48  BILITOT 0.7  PROT 6.7  ALBUMIN 3.3*   No results for input(s): LIPASE, AMYLASE in the last 168 hours. No results for input(s): AMMONIA in the last 168 hours. CBC: Recent Labs  Lab 12/07/19 1941 12/08/19 0528 12/09/19 0609 12/11/19 0453  WBC 9.7 10.8* 10.6* 11.9*  NEUTROABS  --   --  7.7  --   HGB 13.5 12.9 12.6 12.6  HCT 40.6 38.2 36.9 38.0  MCV 83.9 82.9 82.9 85.2  PLT 276 266 255 263   Cardiac Enzymes: No results for input(s): CKTOTAL, CKMB, CKMBINDEX, TROPONINI in the last 168 hours. BNP: Invalid input(s): POCBNP CBG: No results for input(s): GLUCAP in the last 168 hours. D-Dimer No results for input(s): DDIMER in the last 72 hours. Hgb A1c No results for input(s): HGBA1C in the last 72 hours. Lipid Profile No results for input(s): CHOL, HDL, LDLCALC, TRIG, CHOLHDL, LDLDIRECT in the last 72 hours. Thyroid function studies Recent Labs    12/09/19 0609  TSH 0.664   Anemia work up No results for input(s): VITAMINB12, FOLATE, FERRITIN, TIBC, IRON, RETICCTPCT in the last 72 hours. Urinalysis No results found for: COLORURINE, APPEARANCEUR, [place], [place], [place], [place], [place], [place], PROTEINUR, UROBILINOGEN, NITRITE, LEUKOCYTESUR Sepsis  Labs Invalid input(s): PROCALCITONIN,  WBC,  LACTICIDVEN Microbiology Recent Results (from the past 240 hour(s))  Respiratory Panel by RT PCR (Flu A&B, Covid) - Nasopharyngeal Swab     Status: None   Collection Time: 12/07/19 11:47 PM   Specimen: Nasopharyngeal Swab  Result Value Ref Range Status   SARS Coronavirus 2 by RT PCR NEGATIVE NEGATIVE Final    Comment: (NOTE) SARS-CoV-2 target nucleic acids are NOT DETECTED.  The SARS-CoV-2 RNA is generally detectable in upper respiratoy specimens during the acute phase of infection. The lowest concentration of SARS-CoV-2 viral copies this assay can detect is 131 copies/mL. A negative result does not preclude SARS-Cov-2 infection and should not be used as the sole basis for treatment or other patient management decisions. A negative result may occur with  improper specimen collection/handling, submission of specimen other than nasopharyngeal swab, presence of viral mutation(s) within the areas targeted by this assay, and inadequate number of viral copies (<131 copies/mL). A negative result must be combined with clinical observations, patient history, and epidemiological information. The expected result is Negative.  Fact Sheet for Patients:  PinkCheek.be  Fact Sheet for Healthcare Providers:  GravelBags.it  This test is no t yet approved or cleared by the [place] FDA and  has been authorized for detection and/or diagnosis of SARS-CoV-2 by FDA under an Emergency Use Authorization (EUA). This EUA will remain  in effect (meaning this test can be used) for the duration of  the COVID-19 declaration under Section 564(b)(1) of the Act, 21 U.S.C. section 360bbb-3(b)(1), unless the authorization is terminated or revoked sooner.     Influenza A by PCR NEGATIVE NEGATIVE Final   Influenza B by PCR NEGATIVE NEGATIVE Final    Comment: (NOTE) The Xpert Xpress SARS-CoV-2/FLU/RSV assay  is intended as an aid in  the diagnosis of influenza from Nasopharyngeal swab specimens and  should not be used as a sole basis for treatment. Nasal washings and  aspirates are unacceptable for Xpert Xpress SARS-CoV-2/FLU/RSV  testing.  Fact Sheet for Patients: PinkCheek.be  Fact Sheet for Healthcare Providers: GravelBags.it  This test is not yet approved or cleared by the [place] FDA and  has been authorized for detection and/or diagnosis of SARS-CoV-2 by  FDA under an Emergency Use Authorization (EUA). This EUA will remain  in effect (meaning this test can be used) for the duration of the  Covid-19 declaration under Section 564(b)(1) of the Act, 21  U.S.C. section 360bbb-3(b)(1), unless the authorization is  terminated or revoked. Performed at [facility], [address] [place], [place] [address]   Respiratory Panel by PCR     Status: Abnormal   Collection Time: 12/09/19  3:20 PM   Specimen: Nasopharyngeal Swab; Respiratory  Result Value Ref Range Status   Adenovirus NOT DETECTED NOT DETECTED Final   Coronavirus 229E NOT DETECTED NOT DETECTED Final    Comment: (NOTE) The Coronavirus on the Respiratory Panel, DOES NOT test for the novel  Coronavirus (2019 nCoV)    Coronavirus HKU1 NOT DETECTED NOT DETECTED Final   Coronavirus NL63 NOT DETECTED NOT DETECTED Final   Coronavirus OC43 NOT DETECTED NOT DETECTED Final   Metapneumovirus NOT DETECTED NOT DETECTED Final   Rhinovirus / Enterovirus DETECTED (A) NOT DETECTED Final   Influenza A NOT DETECTED NOT DETECTED Final   Influenza B NOT DETECTED NOT DETECTED Final   Parainfluenza Virus 1 NOT DETECTED NOT DETECTED Final   Parainfluenza Virus 2 NOT DETECTED NOT DETECTED Final   Parainfluenza Virus 3 NOT DETECTED NOT DETECTED Final   Parainfluenza Virus 4 NOT DETECTED NOT DETECTED Final   Respiratory Syncytial Virus NOT DETECTED NOT DETECTED Final    Bordetella pertussis NOT DETECTED NOT DETECTED Final   Chlamydophila pneumoniae NOT DETECTED NOT DETECTED Final   Mycoplasma pneumoniae NOT DETECTED NOT DETECTED Final    Comment: Performed at [facility], [place]. [address], [place], [place] [address]     Time coordinating discharge: Over 30 minutes  SIGNED:   Mckinley Jewel, MD  [facility] 12/11/2019, 10:11 AM Pager   If 7PM-7AM, please contact night-coverage [email or web address]

## 2019-12-12 ENCOUNTER — Telehealth: Payer: Self-pay | Admitting: Family Medicine

## 2019-12-12 NOTE — Telephone Encounter (Signed)
[employer] FMLA paperwork was faxed into front office  Placed into [employer] bin up front

## 2019-12-12 NOTE — Telephone Encounter (Signed)
Form placed in Wendling's signature folder

## 2019-12-15 ENCOUNTER — Ambulatory Visit: Payer: 59 | Admitting: Medical

## 2019-12-15 ENCOUNTER — Other Ambulatory Visit: Payer: Self-pay

## 2019-12-15 ENCOUNTER — Encounter: Payer: Self-pay | Admitting: Family Medicine

## 2019-12-15 ENCOUNTER — Ambulatory Visit: Payer: 59 | Admitting: Family Medicine

## 2019-12-15 VITALS — BP 100/66 | HR 84 | Temp 98.3°F | Resp 12 | Ht 64.0 in | Wt 233.0 lb

## 2019-12-15 DIAGNOSIS — R0600 Dyspnea, unspecified: Secondary | ICD-10-CM

## 2019-12-15 DIAGNOSIS — R0609 Other forms of dyspnea: Secondary | ICD-10-CM

## 2019-12-15 DIAGNOSIS — J069 Acute upper respiratory infection, unspecified: Secondary | ICD-10-CM

## 2019-12-15 DIAGNOSIS — E559 Vitamin D deficiency, unspecified: Secondary | ICD-10-CM

## 2019-12-15 LAB — VITAMIN D 25 HYDROXY (VIT D DEFICIENCY, FRACTURES): VITD: 45.06 ng/mL (ref 30.00–100.00)

## 2019-12-15 LAB — CBC
HCT: 39.8 % (ref 36.0–46.0)
Hemoglobin: 13.1 g/dL (ref 12.0–15.0)
MCHC: 32.8 g/dL (ref 30.0–36.0)
MCV: 85.1 fl (ref 78.0–100.0)
Platelets: 232 10*3/uL (ref 150.0–400.0)
RBC: 4.68 Mil/uL (ref 3.87–5.11)
RDW: 14.1 % (ref 11.5–15.5)
WBC: 11.5 10*3/uL — ABNORMAL HIGH (ref 4.0–10.5)

## 2019-12-15 MED ORDER — PROMETHAZINE-DM 6.25-15 MG/5ML PO SYRP: 5.0000 mL | ORAL_SOLUTION | Freq: Four times a day (QID) | ORAL | 0 refills | Status: DC | PRN

## 2019-12-15 MED ORDER — METHYLPREDNISOLONE SODIUM SUCC 125 MG IJ SOLR
125.0000 mg | Freq: Once | INTRAMUSCULAR | Status: AC
  Administered 2019-12-15: 125 mg via INTRAMUSCULAR

## 2019-12-15 MED ORDER — FLUTICASONE PROPIONATE HFA 110 MCG/ACT IN AERO: 2.0000 | INHALATION_SPRAY | Freq: Two times a day (BID) | RESPIRATORY_TRACT | 2 refills | Status: DC

## 2019-12-15 MED ORDER — ALBUTEROL SULFATE (2.5 MG/3ML) 0.083% IN NEBU: INHALATION_SOLUTION | RESPIRATORY_TRACT | 1 refills | Status: DC

## 2019-12-15 NOTE — Patient Instructions (Addendum)
Every hour that you are awake, take 10 deep breaths and breathe out as hard and as long as you can.   Use the new inhaler before you brush your teeth.   I suspect we need more time.  We will fax the form today.  If we aren't turning the corner, I will see you later this week. If we are getting better, but not comfortable enough to work, I will see you next Monday.  Give [place] 2-3 business days to get the results of your labs back.   Let us know if you need anything.

## 2019-12-15 NOTE — Progress Notes (Signed)
Chief Complaint  Patient presents with  . Hospitalization Follow-up  . recheck vit d    HPI Marie Rios is a 39 y.o. y.o. female who presents for a transition of care visit.  Pt was discharged from [facility] on 11/11.  Pt is within 48 business hrs of dc.   Patient was admitted on 11/7 and discharged on 12/11/2019 for acute respiratory failure secondary to a viral URI.  She tested negative for Covid, but positive for rhinovirus/enterovirus.  She has a history of asthma that she does not take any medication routinely for.  She has been using an albuterol inhaler with some relief.  She has been trying to take deep breaths on an hourly basis but is still having dyspnea on exertion.  She denies having any wheezing but she is coughing and having runny nose.  No fevers.  She finished a short course of azithromycin in addition to prednisone 2 days ago.  She had a negative echo and CTA in the hospital.  Past Medical History:  Diagnosis Date  . Anxiety   . Asthma   . Breast cyst, left 2019  . Depression   . Fibromyalgia   . History of stomach ulcers   . Lumbar herniated disc   . Migraine   . Seizures ([place])    Past Surgical History:  Procedure Laterality Date  . APPENDECTOMY  1996  . CHOLECYSTECTOMY  2003  . LAPAROSCOPIC OOPHERECTOMY Right 2006  . LAPAROSCOPIC OOPHERECTOMY Left 2007  . pevlic surgery  [id number]   Plevic mass  . TUBAL LIGATION  2003  . VAGINAL HYSTERECTOMY  2005   Family History  Problem Relation Age of Onset  . Heart attack Father        nov 2018  . Diabetes Other   . Cancer Other   . Heart disease Other   . Stroke Other   . Anemia Other   . Fibromyalgia Other   . Seizures Neg Hx   . Migraines Neg Hx    Allergies as of 12/15/2019      Reactions   Gabapentin    Intolerant.    Keflex [cephalexin]    Reflux   Latex Other (See Comments)   Rash   Ciprofloxacin Rash      Medication List       Accurate as of December 15, 2019 12:11 PM. If you have any questions,  ask your nurse or doctor.        STOP taking these medications   brompheniramine-pseudoephedrine-DM 30-2-10 MG/5ML syrup Stopped by: Shelda Pal, DO   predniSONE 20 MG tablet Commonly known as: DELTASONE Stopped by: Shelda Pal, DO     TAKE these medications   albuterol 108 (90 Base) MCG/ACT inhaler Commonly known as: VENTOLIN HFA Inhale 2 puffs into the lungs every 6 (six) hours as needed for wheezing or shortness of breath. What changed: Another medication with the same name was added. Make sure you understand how and when to take each. Changed by: Shelda Pal, DO   albuterol (2.5 MG/3ML) 0.083% nebulizer solution Commonly known as: PROVENTIL Use every 4 hours while awake for 5 days. Then use every 6 hours as needed. What changed: You were already taking a medication with the same name, and this prescription was added. Make sure you understand how and when to take each. Changed by: Shelda Pal, DO   Emgality 120 MG/ML Soaj Generic drug: Galcanezumab-gnlm INJECT 120 MG INTO THE SKIN EVERY 30 (THIRTY) DAYS.  estrogens-methylTEST 1.25-2.5 MG tablet Commonly known as: ESTRATEST Take 1 tablet by mouth daily.   fluticasone 110 MCG/ACT inhaler Commonly known as: FLOVENT HFA Inhale 2 puffs into the lungs in the morning and at bedtime. Started by: Shelda Pal, DO   guaiFENesin 600 MG 12 hr tablet Commonly known as: MUCINEX Take 600 mg by mouth 2 (two) times daily.   ipratropium 0.03 % nasal spray Commonly known as: ATROVENT Place 2 sprays into both nostrils daily.   ondansetron 4 MG disintegrating tablet Commonly known as: ZOFRAN-ODT Take 1 tablet (4 mg total) by mouth every 8 (eight) hours as needed for nausea or vomiting.   PEPTO-BISMOL PO Take 1 tablet by mouth as needed.   promethazine-dextromethorphan 6.25-15 MG/5ML syrup Commonly known as: PROMETHAZINE-DM Take 5 mLs by mouth 4 (four) times daily as  needed. Started by: Shelda Pal, DO   rizatriptan 10 MG tablet Commonly known as: MAXALT Take 1 tablet (10 mg total) by mouth as needed for migraine. May repeat in 2 hours if needed. Max 2 doses in 24 hours. Appointment needed.   Vitamin D (Ergocalciferol) 1.25 MG (50000 UNIT) Caps capsule Commonly known as: DRISDOL Take 1 capsule (50,000 Units total) by mouth every 7 (seven) days. What changed: additional instructions   Vitamin D3 25 MCG tablet Commonly known as: Vitamin D Take 3,000 Units by mouth daily.       ROS:  Constitutional: No fevers or chills, no weight loss HEENT: No headaches, hearing loss, or runny nose, no sore throat Heart: No chest pain Lungs: +SOB, + cough Abd: No bowel changes, no pain, no N/V GU: No urinary complaints Neuro: No numbness, tingling or weakness Msk: No joint or muscle pain  Objective BP 100/66 (BP Location: Left Arm, Cuff Size: Large)   Pulse 84   Temp 98.3 F (36.8 C) (Oral)   Resp 12   Ht 5\' 4"  (1.626 m)   Wt 233 lb (105.7 kg)   SpO2 98%   BMI 39.99 kg/m  General Appearance:  awake, alert, oriented, in no acute distress and well developed, well nourished Skin:  there are no suspicious lesions or rashes of concern Head/face:  NCAT Eyes:  EOMI, PERRLA Ears:  canals and TMs NI Nose/Sinuses:  negative Mouth/Throat:  Mucosa moist, no lesions; pharynx without erythema, edema or exudate. Neck:  neck- supple, no mass, non-tender and no jvd Lungs: Clear to auscultation.  No rales, rhonchi, or wheezing. Normal effort, no accessory muscle use. Heart:  Heart sounds are normal.  Regular rate and rhythm without murmur, gallop or rub. No bruits. Abdomen:  BS+, soft, NT, ND, no masses or organomegaly Musculoskeletal:  No muscle group atrophy or asymmetry, gait normal Neurologic:  Alert and oriented x 3, gait normal., reflexes normal and symmetric, strength and  sensation grossly normal Psych exam: Nml mood and affect, age appropriate  judgment and insight  Viral URI - Plan: CBC, promethazine-dextromethorphan (PROMETHAZINE-DM) 6.25-15 MG/5ML syrup  DOE (dyspnea on exertion) - Plan: fluticasone (FLOVENT HFA) 110 MCG/ACT inhaler, albuterol (PROVENTIL) (2.5 MG/3ML) 0.083% nebulizer solution, methylPREDNISolone sodium succinate (SOLU-MEDROL) 125 mg/2 mL injection 125 mg  Vitamin D deficiency - Plan: VITAMIN D 25 Hydroxy (Vit-D Deficiency, Fractures)  Discharge summary and medication list have been reviewed/reconciled.  Labs pending at the time of discharge have been reviewed or are still pending at the time of this visit.  Follow-up labs and appointments have been ordered and/or coordinated appropriately.  Solu-Medrol injection today.  We will start twice daily  Flovent, rinse mouth out after using.  Albuterol nebulizations every 4 hours for the next 5 days.  Albuterol inhaler as needed.  Paperwork to excuse her through next Monday, she may return sooner if she is feeling better though.  If she is not improving, I would like to see her and will consider repeat imaging and possible pulmonology referral.  TRANSITIONAL CARE MANAGEMENT CERTIFICATION:  I certify the following are true:   1. Communication with the patient/care giver was made within 2 business days of discharge.  2. Complexity of Medical decision making is moderate.  3. Face to face visit occurred within 14 days of discharge.   F/u pending definitive, otherwise as originally scheduled. The patient voiced understanding and agreement to the plan.  [place], DO 12/15/19 12:11 PM

## 2019-12-15 NOTE — Telephone Encounter (Signed)
Patient was in for appointment today.  Paperwork filled out and faxed.

## 2019-12-19 ENCOUNTER — Ambulatory Visit: Payer: 59

## 2019-12-22 ENCOUNTER — Encounter: Payer: Self-pay | Admitting: Family Medicine

## 2020-02-03 ENCOUNTER — Telehealth: Payer: Self-pay | Admitting: *Deleted

## 2020-02-03 NOTE — Telephone Encounter (Signed)
Completed Emgality PA on Cover My Meds. Key: [id number]. Awaiting determination from [facility].

## 2020-02-03 NOTE — Telephone Encounter (Signed)
Request Reference Number: [id number]. EMGALITY INJ 120MG /ML is approved through 02/02/2021. Your patient may now fill this prescription and it will be covered.

## 2020-02-06 ENCOUNTER — Encounter: Payer: Self-pay | Admitting: Family Medicine

## 2020-02-06 ENCOUNTER — Ambulatory Visit: Payer: 59 | Admitting: Family Medicine

## 2020-02-06 ENCOUNTER — Ambulatory Visit
Admission: RE | Admit: 2020-02-06 | Discharge: 2020-02-06 | Disposition: A | Discharge status: Home / Self Care | Payer: 59 | Source: Ambulatory Visit | Attending: Obstetrics & Gynecology | Admitting: Obstetrics & Gynecology

## 2020-02-06 ENCOUNTER — Other Ambulatory Visit: Payer: Self-pay

## 2020-02-06 VITALS — BP 128/78 | HR 92 | Temp 98.4°F | Ht 64.0 in | Wt 249.1 lb

## 2020-02-06 DIAGNOSIS — M940 Chondrocostal junction syndrome [Tietze]: Secondary | ICD-10-CM

## 2020-02-06 DIAGNOSIS — Z1231 Encounter for screening mammogram for malignant neoplasm of breast: Secondary | ICD-10-CM

## 2020-02-06 DIAGNOSIS — R059 Cough, unspecified: Secondary | ICD-10-CM

## 2020-02-06 DIAGNOSIS — M25559 Pain in unspecified hip: Secondary | ICD-10-CM | POA: Diagnosis not present

## 2020-02-06 MED ORDER — PREDNISONE 20 MG PO TABS: 40.0000 mg | ORAL_TABLET | Freq: Every day | ORAL | 0 refills | Status: AC

## 2020-02-06 NOTE — Patient Instructions (Addendum)
OK to take Tylenol 1000 mg (2 extra strength tabs) or 975 mg (3 regular strength tabs) every 6 hours as needed.  Ice/cold pack over area for 10-15 min twice daily.  Heat (pad or rice pillow in microwave) over affected area, 10-15 minutes twice daily.   Send me a message in a couple weeks if the cough is not improving. I will change your inhaler.  Send me a message in 4 weeks if chest still bothersome and we can set you up with physical therapy.   Let us know if you need anything.   Pectoralis Major Rehab Ask your health care provider which exercises are safe for you. Do exercises exactly as told by your health care provider and adjust them as directed. It is normal to feel mild stretching, pulling, tightness, or discomfort as you do these exercises, but you should stop right away if you feel sudden pain or your pain gets worse.Do not begin these exercises until told by your health care provider. Stretching and range of motion exercises These exercises warm up your muscles and joints and improve the movement and flexibility of your shoulder. These exercises can also help to relieve pain, numbness, and tingling. Exercise A: Pendulum  1. Stand near a wall or a surface that you can hold onto for balance. 2. Bend at the waist and let your left / right arm hang straight down. Use your other arm to keep your balance. 3. Relax your arm and shoulder muscles, and move your hips and your trunk so your left / right arm swings freely. Your arm should swing because of the motion of your body, not because you are using your arm or shoulder muscles. 4. Keep moving so your arm swings in the following directions, as told by your health care provider: ? Side to side. ? Forward and backward. ? In clockwise and counterclockwise circles. 5. Slowly return to the starting position. Repeat 2 times. Complete this exercise 3 times per week. Exercise B: Abduction, standing 1. Stand and hold a broomstick, a cane,  or a similar object. Place your hands a little more than shoulder-width apart on the object. Your left / right hand should be palm-up, and your other hand should be palm-down. 2. While keeping your elbow straight and your shoulder muscles relaxed, push the stick across your body toward your left / right side. Raise your left / right arm to the side of your body and then over your head until you feel a stretch in your shoulder. ? Stop when you reach the angle that is recommended by your health care provider. ? Avoid shrugging your shoulder while you raise your arm. Keep your shoulder blade tucked down toward the middle of your spine. 3. Hold for 10 seconds. 4. Slowly return to the starting position. Repeat 2 times. Complete this exercise 3 times per week. Exercise C: Wand flexion, supine  1. Lie on your back. You may bend your knees for comfort. 2. Hold a broomstick, a cane, or a similar object so that your hands are about shoulder-width apart on the object. Your palms should face toward your feet. 3. Raise your left / right arm in front of your face, then behind your head (toward the floor). Use your other hand to help you do this. Stop when you feel a gentle stretch in your shoulder, or when you reach the angle that is recommended by your health care provider. 4. Hold for 3 seconds. 5. Use the broomstick and your  other arm to help you return your left / right arm to the starting position. Repeat 2 times. Complete this exercise 3 times per week. Exercise D: Wand shoulder external rotation 1. Stand and hold a broomstick, a cane, or a similar object so your handsare about shoulder-width apart on the object. 2. Start with your arms hanging down, then bend both elbows to an "L" shape (90 degrees). 3. Keep your left / right elbow at your side. Use your other hand to push the stick so your left / right forearm moves away from your body, out to your side. ? Keep your left / right elbow bent to 90  degrees and keep it against your side. ? Stop when you feel a gentle stretch in your shoulder, or when you reach the angle recommended by your health care provider. 4. Hold for 10 seconds. 5. Use the stick to help you return your left / right arm to the starting position. Repeat 2 times. Complete this exercise 3 times per week. Strengthening exercises These exercises build strength and endurance in your shoulder. Endurance is the ability to use your muscles for a long time, even after your muscles get tired. Exercise E: Scapular protraction, standing 1. Stand so you are facing a wall. Place your feet about one arm-length away from the wall. 2. Place your hands on the wall and straighten your elbows. 3. Keep your hands on the wall as you push your upper back away from the wall. You should feel your shoulder blades sliding forward.Keep your elbows and your head still. ? If you are not sure that you are doing this exercise correctly, ask your health care provider for more instructions. 4. Hold for 3 seconds. 5. Slowly return to the starting position. Let your muscles relax completely before you repeat this exercise. Repeat 2 times. Complete this exercise 3 times per week. Exercise F: Shoulder blade squeezes  (scapular retraction) 1. Sit with good posture in a stable chair. Do not let your back touch the back of the chair. 2. Your arms should be at your sides with your elbows bent. You may rest your forearms on a pillow if that is more comfortable. 3. Squeeze your shoulder blades together. Bring them down and back. ? Keep your shoulders level. ? Do not lift your shoulders up toward your ears. 4. Hold for 3 seconds. 5. Return to the starting position. Repeat 2 times. Complete this exercise 3 times per week. This information is not intended to replace advice given to you by your health care provider. Make sure you discuss any questions you have with your health care provider. Document Released:  01/16/2005 Document Revised: 10/28/2015 Document Reviewed: 10/04/2014 Elsevier Interactive Patient Education  2018 [place] [facility] It is normal to feel mild stretching, pulling, tightness, or discomfort as you do these exercises, but you should stop right away if you feel sudden pain or your pain gets worse.   Stretching and range of motion exercise This exercise warms up your muscles and joints and improves the movement and flexibility of your hip and pelvis. This exercise also helps to relieve pain and stiffness. Exercise A: Lunge (hip flexor stretch)     1. Kneel on the floor on your left / right knee. Bend your other knee so it is directly over your ankle. 2. Keep good posture with your head over your shoulders. Tuck your tailbone underneath you. This will prevent your back from arching too much. 3. You  should feel a gentle stretch in the front of your thigh or hip. If you do not feel a stretch, slowly lunge forward with your chest up. 4. Hold this position for 30 seconds. 5. Slowly return to the starting position. Repeat 2 times. Complete this exercise 3 times per week. Strengthening exercises These exercises build strength and endurance in your hip and pelvis. Endurance is the ability to use your muscles for a long time, even after they get tired. Exercise B: Bridge (hip extensors)    1. Lie on your back on a firm surface with your knees bent and your feet flat on the floor. 2. Tighten your buttocks muscles and lift your bottom off the floor until the trunk of your body is level with your thighs. ? You should feel the muscles working in your buttocks and the back of your thighs. If this exercise is too easy, cross your arms over your chest or lift one leg while your bottom is up off the floor. ? Do not arch your back. 3. Hold this position for 3 seconds. 4. Slowly lower your hips to the starting position. 5. Let your muscles relax completely between  repetitions. Repeat 2 times. Complete this exercise 3 times per week. Exercise C: Straight leg raises (hip abductors)    1. Lie on your side with your left / right leg in the top position. Lie so your head, shoulder, knee, and hip line up. Bend your bottom knee to help you balance. 2. Lift your top leg up 4-6 inches (10-15 cm), keeping your toes pointed straight ahead. 3. Hold this position for 2 seconds. 4. Slowly lower your leg to the starting position and let your muscles relax completely. Repeat for a total of 10 repetitions. Repeat 2 times. Complete this exercise 3 times per week. Exercise D: Hip abductors and external rotators, quadruped 1. Get on your hands and knees on a firm, lightly padded surface. Your hands should be directly below your shoulders, and your knees should be directly below your hips. 2. Lift your left / right knee out to the side. Keep your knee bent. Do not twist your body. 3. Hold this position for 3 seconds. 4. Slowly lower your leg. Repeat for a total of 10 repetitions.  Repeat 2 times. Complete this exercise 3 times per week. Exercise E: Single leg stand 1. Stand near a counter or door frame to hold onto as needed. It is helpful to look in a mirror for this exercise so you can watch your hip. 2. Squeeze your left / right buttock muscles then lift up your other foot. Do not let your left / righthip push out to the side. 3. Hold this position for 3 seconds. Repeat for a total of 10 repetitions. Repeat 2 times. Complete this exercise 3 times per week. Make sure you discuss any questions you have with your health care provider. Document Released: 01/16/2005 Document Revised: 09/23/2015 Document Reviewed: 12/29/2014 Elsevier Interactive Patient Education  [employer].

## 2020-02-06 NOTE — Progress Notes (Signed)
Chief Complaint  Patient presents with  . Follow-up    Marie Rios is a 40 y.o. female here for evaluation of chest pain.  Duration of issue: 2 months Quality: achy, dull, notices it more at night when she is laying down Reports 60% improvement and then plateaued. Palliation: none Provocation: stress/getting upset Severity: 6/10 Radiation: none Duration of chest pain: 3 hours Associated symptoms: 15 lb wt gain Cardiac history: none Family heart history: none Smoker? No   The patient also fell on her bed a couple days ago.  She fell on her right back.  No bruising, redness, or swelling.  She has been taking ibuprofen with some relief.  No neurologic signs or symptoms.  Past Medical History:  Diagnosis Date  . Anxiety   . Asthma   . Breast cyst, left 2019  . Depression   . Fibromyalgia   . History of stomach ulcers   . Lumbar herniated disc   . Migraine   . Seizures ([place])    Family History  Problem Relation Age of Onset  . Heart attack Father        nov 2018  . Diabetes Other   . Cancer Other   . Heart disease Other   . Stroke Other   . Anemia Other   . Fibromyalgia Other   . Seizures Neg Hx   . Migraines Neg Hx     BP 128/78 (BP Location: Left Arm, Patient Position: Sitting, Cuff Size: Large)   Pulse 92   Temp 98.4 F (36.9 C) (Oral)   Ht 5\' 4"  (1.626 m)   Wt 249 lb 2 oz (113 kg)   SpO2 97%   BMI 42.76 kg/m  Gen: awake, alert, appears stated age HEENT: PERRLA, MMM Neck: No masses or asymmetry Heart: RRR, no bruits, no LE edema Lungs: CTAB, no accessory muscle use Abd: Soft, NT, ND, no masses or organomegaly MSK: chest pain is reproducible to palptation over rib 4 through 6 on the left at the junction of the sternum and ribs  She also has tenderness to palpation over the right outer gluteal and SI joint on the right; negative logroll, equivocal FABER, FADDIR Psych: Age appropriate judgment and insight, nml mood and affect  Costochondritis - Plan:  predniSONE (DELTASONE) 20 MG tablet  Cough  Hip pain - Plan: predniSONE (DELTASONE) 20 MG tablet  1.  Ice, Tylenol, stretches and exercises. 2.  Could still be a postinfectious cough.  Will consider adding a long-acting beta agonist to inhaled corticosteroid if no improvement.  Also consider imaging and referral to pulmonology. 3.  Acute, prednisone burst 40 mg for 5 days.  Stretches and exercises provided as well. F/u prn. The patient voiced understanding and agreement to the plan.  [place], DO 02/06/20 4:47 PM

## 2020-03-12 ENCOUNTER — Encounter: Payer: Self-pay | Admitting: Family Medicine

## 2020-03-15 MED ORDER — RIZATRIPTAN BENZOATE 10 MG PO TABS: 10.0000 mg | ORAL_TABLET | ORAL | 0 refills | Status: DC | PRN

## 2020-05-07 ENCOUNTER — Other Ambulatory Visit: Payer: Self-pay | Admitting: Family Medicine

## 2020-05-11 MED ORDER — RIZATRIPTAN BENZOATE 10 MG PO TABS: 10.0000 mg | ORAL_TABLET | ORAL | 0 refills | Status: DC | PRN

## 2020-05-11 MED ORDER — RIZATRIPTAN BENZOATE 10 MG PO TABS: 10.0000 mg | ORAL_TABLET | ORAL | 2 refills | Status: DC | PRN

## 2020-05-11 NOTE — Addendum Note (Signed)
Addended by: Larey Seat on: 05/11/2020 12:59 PM   Modules accepted: Orders

## 2020-06-09 ENCOUNTER — Other Ambulatory Visit: Payer: Self-pay | Admitting: Obstetrics & Gynecology

## 2020-06-09 DIAGNOSIS — N951 Menopausal and female climacteric states: Secondary | ICD-10-CM

## 2020-06-09 MED ORDER — EST ESTROGENS-METHYLTEST 1.25-2.5 MG PO TABS: 1.0000 | ORAL_TABLET | Freq: Every day | ORAL | 4 refills | Status: AC

## 2020-06-09 NOTE — Progress Notes (Signed)
Patient sent in request for refill of HRT, has history of hysterectomy for benign indications.  This was refilled for her.   Verita Schneiders, MD

## 2020-07-05 ENCOUNTER — Encounter: Payer: Self-pay | Admitting: Family Medicine

## 2020-07-05 ENCOUNTER — Other Ambulatory Visit: Payer: Self-pay | Admitting: Family Medicine

## 2020-07-05 MED ORDER — ONDANSETRON 4 MG PO TBDP: 4.0000 mg | ORAL_TABLET | Freq: Three times a day (TID) | ORAL | 11 refills | Status: DC | PRN

## 2020-09-20 ENCOUNTER — Ambulatory Visit: Payer: 59 | Admitting: Family Medicine

## 2020-09-20 ENCOUNTER — Encounter: Payer: Self-pay | Admitting: Family Medicine

## 2020-09-20 VITALS — BP 121/80 | HR 73 | Ht 64.0 in | Wt 255.0 lb

## 2020-09-20 DIAGNOSIS — Z9989 Dependence on other enabling machines and devices: Secondary | ICD-10-CM | POA: Diagnosis not present

## 2020-09-20 DIAGNOSIS — G4733 Obstructive sleep apnea (adult) (pediatric): Secondary | ICD-10-CM | POA: Diagnosis not present

## 2020-09-20 MED ORDER — RIZATRIPTAN BENZOATE 10 MG PO TABS: 10.0000 mg | ORAL_TABLET | ORAL | 2 refills | Status: DC | PRN

## 2020-09-20 MED ORDER — EMGALITY 120 MG/ML ~~LOC~~ SOAJ: SUBCUTANEOUS | 3 refills | Status: DC

## 2020-09-20 NOTE — Progress Notes (Addendum)
PATIENT: Marie Rios DOB: 26-Nov-1980  REASON FOR VISIT: follow up HISTORY FROM: patient  Chief Complaint  Patient presents with   Obstructive Sleep Apnea    Rm 1, alone. Here for CPAP and migraine f/u, pt reports no changes since last OV. Pt belives Emgailty has been causing weight gain and would like to discuss changing.       HISTORY OF PRESENT ILLNESS: 09/20/20 ALL:  Sokha returns for follow up for migraines ([place]) and OSA on CPAP (Athar). She continues Teaching laboratory technician and rizatrptan. She reports headaches are better on Emgality. She does continue to have about 15 headache days. About 7 migraine days. Migraines are usually easily aborted with rizatriptan. She may take 4-5 times a month. She reports weight gain over the past year. She is using intermittent fasting with diet changes but not able to lose weight. Review of chart shows that she lost 25 pounds after being on Emgality for 1 year. She has gained about 45 pounds over the past year. She is now on estrogen replacement. She is hesitant to make a change as Emgality has worked so well.     09/18/2019 ALL:  [name] is a 40 y.o. female here today for follow up for migraines and OSA.  She reports that she is doing fairly well today.  She does continue to have regular headaches.  She feels that she has some sort of mild headache almost every day.  She feels that Emgality has helped but is not working as well as it used to.  She has at least 4 migraines every month, sometimes more.  Rizatriptan does help to abort migraine.  She is using her CPAP every night.  She is doing very well with CPAP therapy and has been for the past year.  She was last seen by ophthalmology in December 2020.  Eye exam was normal.  She has a history of IIH.  She reports psychotic side effects with topiramate and Zonegran.  She is very hesitant to consider Diamox.  She denies any visual changes.  No blurred vision or vision loss.  She does occasionally see  spots in her vision with a headache.  She continues to have concerns of excessive fatigue.  She admits that she does not drink enough water.  She does not exercise.  Her last child has just recently moved into college.  She is working full-time in the Systems analyst.  Compliance report dated 08/18/2019 through 09/16/2019 reveals that she used CPAP 29 of the last 30 days for compliance of 97%.  She is CPAP greater than 4 hours 29 of the past 30 days for compliance of 97%.  Average usage on days used was 7 hours and 12 minutes.  Residual AHI was 1.3 on 5 to 11 cm of water and an EPR of 3.  There was no leak noted in her mask.  HISTORY: (copied from my note on 09/24/2018)  [name] is a 41 y.o. female here today for follow up of migraines. She reports that headaches occur nearly daily. Migraines are improved but continue to occur regularly. She continues Emgality monthly. She has also started CPAP therapy with excellent compliance. She has not noticed much improvement since starting CPAP. She did have MRI that showed concerns of mildly enlarged sella turnica that could be incidental or related to IIH. She has never been treated in the past for IIH. She has taken topiramate and Zonegran in the past with reports  of psychotic events and worsening anger. She reports last eye exam was about a year ago and normal. She denies ringing in ears, vision changes, or loss of vision. Headaches are tension type headaches and occur often in the morning hours.    HISTORY: (copied from my note on 04/08/2018)   [name] is a 40 y.o. female here today for follow up for migraines and dizziness (chronic). She was seen on 3/2 by PCP for worsening migraines and an episode of what she feels was syncope. She reports that she fell and hit her head after standing to go to the kitchen. Her husband came to her aid after hearing her fall. She was sluggish following but no worrisome findings. No seizure like activity,  incontinence, or biting tongue. She was given migraine cocktail of Torodol, Phenergan and Benadryl in office. On 3/4 she contacted PCP with reports of another syncopal episode (after standing up) and no improvement of headache. She was given steroid burst. She did not feel that this helped her headache but did improve dizziness. She reports getting dizzy with standing or turning her head too fast. She is not drinking water regularly. She is taking Emgality monthly as prescribed. She uses Maxalt, Zofran and tizanidine as needed for abortive therapy. She reports that this combination usually helps but has not this past week. She admits that she does not drink much fluids throughout the day.    She is being evaluated by Dr Rexene Alberts for possible sleep apnea and has upcoming sleep study pending insurance approval.    She has questionable seizure activity when angry or stressed. She reports last event was 06/2017. She has not taken Depakote in 2 years.    HISTORY: (copied from Dr Cathren Laine note on 01/15/2018)   Interval update for migraines (seziures may be non-epileptic events). Started Topiramate but could not tolerate. Also started Ajovy (has latex allergy can't take aimovis) but insurance required [place] to try Emgality which was approved. Emgality is helping with migraines. Sent in zofran for acute management. She was on Depakote in the past, seizure-like events happen when angry and stressed. MRI of the brain was ordered but not completed. CBC and CMP were normal.    Having nausea after dose of Emgality. Takes Zofran prior to dose and this  helps. Has 5 headaches per month/ 3-4 migraines per month. This is a 50% improvement.   Couldn't tolerate Trokendi nor Zonegran due to anger and "psychotic episodes."    No seizure like activity since starting Emgality. Last episode was 06/2017.    Still snoring but does not feel it is as bad. 1-2 mornings she wakes up with headaches. She wakes daily with dry mouth. Having  daytime sleepiness. Was advised sleep study    Interval history 09/19/2017: Has not seen patient in approx 2 years. Migraines worsened last year in the setting of stress. She has not taken Depakote in 4 months, she was tired of it. She had reported seizure in June of this year, discussed no driving restrictions. She stopped taking her Depakote. Over the last year she has 15 headache days a month and 8 are migrainous. Migraines are She has light sensitivity, sounds sensitivity, ears ringing, dizziness, vertigo, locations in the frontal area occ in the right occipital. Feels stabbing, throbbing. It can last up to 2 days. Unilateral migraines, pulsating and pounding and throbbing, She gets nausea no vomiting, She has light sensitivity, sounds sensitivity, ears ringing, dizziness, vertigo, locations in the frontal area occ in  the right occipital. Feels stabbing, throbbing. It can last up to 2 days and be severe. She gets nausea no vomiting. She has untreated sleep apnea. She takes excedrin every day. She has positional headaches and also vision blurriness and vision changes. Untreated sleep apnea with severe sleep apnea.   - requested records from previous sleep doctor, pending   - reviewed sleep report and data, AHI in 2011 to a max of 121 in REM sleep, was titrated on 10cm in 2011   Interval history 12/01/2015:  Patient is doing well. One migraine a month. Tolerating Depakote well. No more events of alteration of consciousness. The maxalt works with the zofran, she has not tried the [place]. It takes several hours for the maxalt to work, she has to take it twice. Suggest taking the maxalt with the zofran and cambia. She had hysterectomy.    HPI:  QUINTINA SANGHVI is a 40 y.o. female here as a referral from Dr. Vista Deck for Migraines and seizures in the setting of stress and anxiety.migraines Started 10 years ago. She has the migraines twice a month. She has light sensitivity, sounds sensitivity, ears ringing,  dizziness, vertigo, locations in the frontal area occ in the right occipital. Feels stabbing, throbbing. It can last up to 2 days. She gets nausea no vomiting. She has diarrhea sometimes. She is on Depakote but just recently restarted, she was successfully treated with Depakote in the past but was feeling better and stopped it. She has had a hysterectomy. She has been on Depakote off and on for 20 years. Also on Dilantin. She has a history of seizures as well. The seizures always happen when she gets stressed or with anger or anxiety , she passes out and jerks and shakes and wakes up confused. Workup has been negative. EEGs have been normal. Last time she had a seizure was on June 6th. She was seeing a neurologist. She was on Topiramate '200mg'$  twice a day. She was seeing a doctor in [place] and the Topiramate was too high. She was angry earlier this month, she was frstrated, she "passed out". She says she got dizzy, vertigo, she came to with a cloth over her head and something cold over her neck. It lasted one minute. Husband says she will jerk than shake back and forth. Only when she stresses. This was the first one in a year. The seizures are always with stress or emotion that triggers it. She was on Depakote twice a day for many years. No other focal neurologic deficits or complaints.   Reviewed notes, labs and imaging from outside physicians, which showed: Patient was seen at [facility] for evaluation of vertigo, symptoms started in July 2012 with elevated Dilantin levels in the emergency room. Stress exacerbate her symptoms. She has participated and vestibular rehabilitation. The rehabilitation and improved her dizziness been no change in nausea. She reported associated seizures and migraines and associated sensitivity to light. On examination tests of ocular motor function revealed normal saccades, pursuit and optokinetic eye movements. There was no spontaneous nystagmus or gaze evoked nystagmus, rotary chair  testing revealed normal vestibular ocular reflex gain, phase and symmetry at all frequencies testing indicating affective vestibular control. Dix-Hallpike's tests were negative for posterior semicircular canal BPPV, roll tests were negative for horizontal semicircular canal BPPV, caloric stimulation showed symmetric vestibular responses. Diagnosis was normal peripheral and central vestibular functions.   REVIEW OF SYSTEMS: Out of a complete 14 system review of symptoms, the patient complains only of the following  symptoms, headaches, fatigue and all other reviewed systems are negative.  ALLERGIES: Allergies  Allergen Reactions   Gabapentin     Intolerant.    Keflex [Cephalexin]     Reflux   Latex Other (See Comments)    Rash   Ciprofloxacin Rash    HOME MEDICATIONS: Outpatient Medications Prior to Visit  Medication Sig Dispense Refill   albuterol (PROVENTIL) (2.5 MG/3ML) 0.083% nebulizer solution Use every 4 hours while awake for 5 days. Then use every 6 hours as needed. 150 mL 1   albuterol (VENTOLIN HFA) 108 (90 Base) MCG/ACT inhaler Inhale 2 puffs into the lungs every 6 (six) hours as needed for wheezing or shortness of breath. 8 g 2   Bismuth Subsalicylate (PEPTO-BISMOL PO) Take 1 tablet by mouth as needed.     estrogens-methylTEST (ESTRATEST) 1.25-2.5 MG tablet Take 1 tablet by mouth daily. 90 tablet 4   fluticasone (FLOVENT HFA) 110 MCG/ACT inhaler Inhale 2 puffs into the lungs in the morning and at bedtime. 1 each 2   ipratropium (ATROVENT) 0.03 % nasal spray Place 2 sprays into both nostrils daily.     ondansetron (ZOFRAN-ODT) 4 MG disintegrating tablet Take 1 tablet (4 mg total) by mouth every 8 (eight) hours as needed for nausea or vomiting. 20 tablet 11   Galcanezumab-gnlm (EMGALITY) 120 MG/ML SOAJ INJECT 120 MG INTO THE SKIN EVERY 30 (THIRTY) DAYS. 1 mL 11   rizatriptan (MAXALT) 10 MG tablet Take 1 tablet (10 mg total) by mouth as needed for migraine. May repeat in 2 hours if  needed. Max 2 doses in 24 hours. Appointment needed. 10 tablet 2   Vitamin D3 (VITAMIN D) 25 MCG tablet Take 3,000 Units by mouth daily.     No facility-administered medications prior to visit.    PAST MEDICAL HISTORY: Past Medical History:  Diagnosis Date   Anxiety    Asthma    Breast cyst, left 2019   Depression    Fibromyalgia    History of stomach ulcers    Lumbar herniated disc    Migraine    Seizures ([place])     PAST SURGICAL HISTORY: Past Surgical History:  Procedure Laterality Date   APPENDECTOMY  1996   CHOLECYSTECTOMY  2003   LAPAROSCOPIC OOPHERECTOMY Right 2006   LAPAROSCOPIC OOPHERECTOMY Left [id number]   pevlic surgery  [id number]   Plevic mass   TUBAL LIGATION  2003   VAGINAL HYSTERECTOMY  2005    FAMILY HISTORY: Family History  Problem Relation Age of Onset   Heart attack Father        nov 2018   Diabetes Other    Cancer Other    Heart disease Other    Stroke Other    Anemia Other    Fibromyalgia Other    Seizures Neg Hx    Migraines Neg Hx     SOCIAL HISTORY: Social History   Socioeconomic History   Marital status: Married    Spouse name: Aaron Edelman   Number of children: 3   Years of education: 14   Highest education level: Not on file  Occupational History   Occupation: [employer]    Comment: left dec 2018   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[employer] [employer]    Comment: Financial controller @ [employer]  Tobacco Use   Smoking status: Former    Packs/day: 0.50    Types: Cigarettes    Quit date: 2018    Years since quitting: 4.6   Smokeless  tobacco: Never   Tobacco comments:    black & mild  Vaping Use   Vaping Use: Former  Substance and Sexual Activity   Alcohol use: Not Currently    Comment: was social, no longer drinking    Drug use: Not Currently    Comment: Marijuana- ocassionally (07/21/15)   Sexual activity: Not on file  Other Topics Concern   Not on file  Social History Narrative   Lives: with spouse and 2 of her children    Caffeine  use: 1 cup of tea daily   Right handed   Social Determinants of Health   Financial Resource Strain: Not on file  Food Insecurity: Not on file  Transportation Needs: Not on file  Physical Activity: Not on file  Stress: Not on file  Social Connections: Not on file  Intimate Partner Violence: Not on file      PHYSICAL EXAM  Vitals:   09/20/20 1514  BP: 121/80  Pulse: 73  Weight: 255 lb (115.7 kg)  Height: '5\' 4"'$  (1.626 m)    Body mass index is 43.77 kg/m.  Generalized: Well developed, in no acute distress  Cardiology: normal rate and rhythm, no murmur noted Respiratory: clear to auscultation bilaterally  Neurological examination  Mentation: Alert oriented to time, place, history taking. Follows all commands speech and language fluent Cranial nerve II-XII: Pupils were equal round reactive to light. Extraocular movements were full, visual field were full  Motor: The motor testing reveals 5 over 5 strength of all 4 extremities. Good symmetric motor tone is noted throughout.  Gait and station: Gait is normal.   DIAGNOSTIC DATA (LABS, IMAGING, TESTING) - I reviewed patient records, labs, notes, testing and imaging myself where available.  No flowsheet data found.   Lab Results  Component Value Date   WBC 11.5 (H) 12/15/2019   HGB 13.1 12/15/2019   HCT 39.8 12/15/2019   MCV 85.1 12/15/2019   PLT 232.0 12/15/2019      Component Value Date/Time   NA 139 12/11/2019 0453   NA 143 09/19/2017 0943   K 4.0 12/11/2019 0453   CL 106 12/11/2019 0453   CO2 26 12/11/2019 0453   GLUCOSE 112 (H) 12/11/2019 0453   BUN 19 12/11/2019 0453   BUN 10 09/19/2017 0943   CREATININE 0.77 12/11/2019 0453   CREATININE 0.73 10/31/2019 1511   CALCIUM 8.9 12/11/2019 0453   PROT 6.7 12/09/2019 0609   PROT 7.2 09/19/2017 0943   ALBUMIN 3.3 (L) 12/09/2019 0609   ALBUMIN 4.3 09/19/2017 0943   AST 17 12/09/2019 0609   ALT 29 12/09/2019 0609   ALKPHOS 48 12/09/2019 0609   BILITOT 0.7  12/09/2019 0609   BILITOT 0.4 09/19/2017 0943   GFRNONAA >60 12/11/2019 0453   GFRAA 131 09/19/2017 0943   Lab Results  Component Value Date   CHOL 166 10/31/2019   HDL 35 (L) 10/31/2019   LDLCALC 116 (H) 10/31/2019   TRIG 48 10/31/2019   CHOLHDL 4.7 10/31/2019   No results found for: HGBA1C Lab Results  Component Value Date   VITAMINB12 761 09/18/2019   Lab Results  Component Value Date   TSH 0.664 12/09/2019       ASSESSMENT AND PLAN 40 y.o. year old female  has a past medical history of Anxiety, Asthma, Breast cyst, left (2019), Depression, Fibromyalgia, History of stomach ulcers, Lumbar herniated disc, Migraine, and Seizures ([place]). here with     ICD-10-CM   1. OSA on CPAP  G47.33 For  home use only DME continuous positive airway pressure (CPAP)   Z99.89        Shelton Silvas feels that, overall, she is doing fairly well. She is hesitant to try Ajovy. It was not covered at last visit. She wishes to continue Emgality for now. She will continue rizatriptan for abortive therapy. She will continue to use CPAP nightly and for greater than 4 hours each night. Healthy lifestyle habits encouraged. She will follow up with me in 1 year, sooner if needed. She verbalizes understanding and agreement with this plan.   Orders Placed This Encounter  Procedures   For home use only DME continuous positive airway pressure (CPAP)    Supplies    Order Specific Question:   Length of Need    Answer:   Lifetime    Order Specific Question:   Patient has OSA or probable OSA    Answer:   Yes    Order Specific Question:   Is the patient currently using CPAP in the home    Answer:   Yes    Order Specific Question:   Settings    Answer:   Other see comments    Order Specific Question:   CPAP supplies needed    Answer:   Mask, headgear, cushions, filters, heated tubing and water chamber      Meds ordered this encounter  Medications   Galcanezumab-gnlm (EMGALITY) 120 MG/ML SOAJ    Sig: INJECT  120 MG INTO THE SKIN EVERY 30 (THIRTY) DAYS.    Dispense:  3 mL    Refill:  3    Order Specific Question:   Supervising Provider    Answer:   Melvenia Beam [id number]   rizatriptan (MAXALT) 10 MG tablet    Sig: Take 1 tablet (10 mg total) by mouth as needed for migraine. May repeat in 2 hours if needed. Max 2 doses in 24 hours. Appointment needed.    Dispense:  10 tablet    Refill:  2    Order Specific Question:   Supervising Provider    Answer:   Melvenia Beam [id number], FNP-C 09/20/2020, 4:06 PM [facility] [address] [place], [place] [address] [phone]  I reviewed the above note and documentation by the Nurse Practitioner and agree with the history, exam, assessment and plan as outlined above. I was available for consultation. Star Age, MD, PhD [facility])

## 2020-09-20 NOTE — Patient Instructions (Signed)
Below is our plan:  We will continue Emgality every 30 days. Continue rizatriptan as needed. Consider switching injection to Ajovy if you wish.   Consider low carb diet.   Please continue using your CPAP regularly. While your insurance requires that you use CPAP at least 4 hours each night on 70% of the nights, I recommend, that you not skip any nights and use it throughout the night if you can. Getting used to CPAP and staying with the treatment long term does take time and patience and discipline. Untreated obstructive sleep apnea when it is moderate to severe can have an adverse impact on cardiovascular health and raise her risk for heart disease, arrhythmias, hypertension, congestive heart failure, stroke and diabetes. Untreated obstructive sleep apnea causes sleep disruption, nonrestorative sleep, and sleep deprivation. This can have an impact on your day to day functioning and cause daytime sleepiness and impairment of cognitive function, memory loss, mood disturbance, and problems focussing. Using CPAP regularly can improve these symptoms.  Please make sure you are staying well hydrated. I recommend 50-60 ounces daily. Well balanced diet and regular exercise encouraged. Consistent sleep schedule with 6-8 hours recommended.   Please continue follow up with care team as directed.   Follow up with me in 1 year   You may receive a survey regarding today's visit. I encourage you to leave honest feed back as I do use this information to improve patient care. Thank you for seeing me today!

## 2020-09-24 DIAGNOSIS — U071 COVID-19: Secondary | ICD-10-CM

## 2020-09-27 ENCOUNTER — Encounter: Payer: Self-pay | Admitting: Family Medicine

## 2020-09-27 ENCOUNTER — Telehealth: Payer: 59 | Admitting: Family Medicine

## 2020-09-27 ENCOUNTER — Other Ambulatory Visit: Payer: Self-pay

## 2020-09-27 DIAGNOSIS — U071 COVID-19: Secondary | ICD-10-CM

## 2020-09-27 MED ORDER — DICYCLOMINE HCL 10 MG PO CAPS: ORAL_CAPSULE | ORAL | 0 refills | Status: DC

## 2020-09-27 MED ORDER — PROMETHAZINE-DM 6.25-15 MG/5ML PO SYRP: 5.0000 mL | ORAL_SOLUTION | Freq: Four times a day (QID) | ORAL | 0 refills | Status: DC | PRN

## 2020-09-27 NOTE — Progress Notes (Signed)
Chief Complaint  Patient presents with   Covid Positive    Tested positive on 09/21/20 and again 09/26/20 and still positive.    Cough    SOB when moving around. Chest hurting Headache Exhaustion All over body pain Nasal congestion No fever today    Marie Rios here for URI complaints. Due to COVID-19 pandemic, we are interacting via web portal for an electronic face-to-face visit. I verified patient's ID using 2 identifiers. Patient agreed to proceed with visit via this method. Patient is at home, I am at office. Patient and I are present for visit.   Duration: 6 days  Associated symptoms: sinus congestion, sinus pain, wheezing, shortness of breath, chest tightness, myalgia, and coughing, loss of taste/smell, abd pain/cramping Denies: rhinorrhea, itchy watery eyes, ear pain, ear drainage, sore throat, and N/V/D Treatment to date: prednisone burst 40 mg/d, Mucinex, breathing tx, elected not to take Paxlovid Sick contacts: No Tested + for covid on 8/23, 8/28 Vaccinated w main series, Moderna. Not the booster.   Past Medical History:  Diagnosis Date   Anxiety    Asthma    Breast cyst, left 2019   Depression    Fibromyalgia    History of stomach ulcers    Lumbar herniated disc    Migraine    Seizures ([facility])     Objective No conversational dyspnea Age appropriate judgment and insight Nml affect and mood  COVID-19 - Plan: dicyclomine (BENTYL) 10 MG capsule, promethazine-dextromethorphan (PROMETHAZINE-DM) 6.25-15 MG/5ML syrup  Out of window for anti-viral. Symptomatic tx as above.  Continue to push fluids, practice good hand hygiene, cover mouth when coughing. F/u prn. If starting to experience irreplaceable fluid loss, shaking, or shortness of breath, seek immediate care. Pt voiced understanding and agreement to the plan.  [place], DO 09/27/20 10:17 AM

## 2020-10-01 ENCOUNTER — Other Ambulatory Visit: Payer: Self-pay | Admitting: Family Medicine

## 2020-10-01 MED ORDER — FLUTICASONE-SALMETEROL 115-21 MCG/ACT IN AERO: 2.0000 | INHALATION_SPRAY | Freq: Two times a day (BID) | RESPIRATORY_TRACT | 1 refills | Status: DC

## 2020-11-02 ENCOUNTER — Encounter: Payer: 59 | Admitting: Family Medicine

## 2020-11-12 ENCOUNTER — Encounter: Payer: 59 | Admitting: Family Medicine

## 2020-11-19 ENCOUNTER — Ambulatory Visit (INDEPENDENT_AMBULATORY_CARE_PROVIDER_SITE_OTHER): Payer: 59 | Admitting: Family Medicine

## 2020-11-19 ENCOUNTER — Encounter: Payer: Self-pay | Admitting: Family Medicine

## 2020-11-19 ENCOUNTER — Other Ambulatory Visit: Payer: Self-pay

## 2020-11-19 VITALS — BP 100/84 | HR 84 | Temp 98.5°F | Resp 18 | Ht 64.0 in | Wt 257.8 lb

## 2020-11-19 DIAGNOSIS — Z0001 Encounter for general adult medical examination with abnormal findings: Secondary | ICD-10-CM

## 2020-11-19 DIAGNOSIS — E236 Other disorders of pituitary gland: Secondary | ICD-10-CM | POA: Diagnosis not present

## 2020-11-19 DIAGNOSIS — R635 Abnormal weight gain: Secondary | ICD-10-CM | POA: Diagnosis not present

## 2020-11-19 DIAGNOSIS — Z0000 Encounter for general adult medical examination without abnormal findings: Secondary | ICD-10-CM

## 2020-11-19 DIAGNOSIS — R079 Chest pain, unspecified: Secondary | ICD-10-CM | POA: Diagnosis not present

## 2020-11-19 NOTE — Progress Notes (Signed)
Chief Complaint  Patient presents with   Annual Exam    Pt states fasting      Well Woman Marie Rios is here for a complete physical.   Her last physical was >1 year ago.  Current diet: in general, a "healthy" diet. Current exercise: walking, dancing. Weight is stable and she denies fatigue out of ordinary. Seatbelt? Yes  Health Maintenance Pap/HPV- No longer has a cervix Mammogram- Yes Tetanus- Yes Hep C screening- Yes HIV screening- Yes  Central chest pain Duration of issue: 11 months Quality: sharp Palliation: rest Provocation: physical exertion Gets it twice a week Severity: 9/10 Radiation: to back Duration of chest pain: 3 minutes Associated symptoms: None Cardiac history: None Family heart history: Father had MI at 64 Smoker? Former, quit in 2018  Past Medical History:  Diagnosis Date   Anxiety    Asthma    Breast cyst, left 2019   Depression    Fibromyalgia    History of stomach ulcers    Lumbar herniated disc    Migraine    Seizures ([place])      Past Surgical History:  Procedure Laterality Date   APPENDECTOMY  1996   CHOLECYSTECTOMY  2003   LAPAROSCOPIC OOPHERECTOMY Right 2006   LAPAROSCOPIC OOPHERECTOMY Left 9833   pevlic surgery  8250   Plevic mass   TUBAL LIGATION  2003   VAGINAL HYSTERECTOMY  2005    Medications  Current Outpatient Medications on File Prior to Visit  Medication Sig Dispense Refill   albuterol (PROVENTIL) (2.5 MG/3ML) 0.083% nebulizer solution Use every 4 hours while awake for 5 days. Then use every 6 hours as needed. 150 mL 1   albuterol (VENTOLIN HFA) 108 (90 Base) MCG/ACT inhaler Inhale 2 puffs into the lungs every 6 (six) hours as needed for wheezing or shortness of breath. 8 g 2   Bismuth Subsalicylate (PEPTO-BISMOL PO) Take 1 tablet by mouth as needed.     dicyclomine (BENTYL) 10 MG capsule Take 1 tab every 6 hours as needed for abdominal cramping. 60 capsule 0   estrogens-methylTEST (ESTRATEST) 1.25-2.5 MG  tablet Take 1 tablet by mouth daily. 90 tablet 4   fluticasone-salmeterol (ADVAIR HFA) 115-21 MCG/ACT inhaler Inhale 2 puffs into the lungs 2 (two) times daily. 1 each 1   Galcanezumab-gnlm (EMGALITY) 120 MG/ML SOAJ INJECT 120 MG INTO THE SKIN EVERY 30 (THIRTY) DAYS. 3 mL 3   ipratropium (ATROVENT) 0.03 % nasal spray Place 2 sprays into both nostrils daily.     ondansetron (ZOFRAN-ODT) 4 MG disintegrating tablet Take 1 tablet (4 mg total) by mouth every 8 (eight) hours as needed for nausea or vomiting. 20 tablet 11   promethazine-dextromethorphan (PROMETHAZINE-DM) 6.25-15 MG/5ML syrup Take 5 mLs by mouth 4 (four) times daily as needed for cough. 118 mL 0   rizatriptan (MAXALT) 10 MG tablet Take 1 tablet (10 mg total) by mouth as needed for migraine. May repeat in 2 hours if needed. Max 2 doses in 24 hours. Appointment needed. 10 tablet 2    Allergies Allergies  Allergen Reactions   Gabapentin     Intolerant.    Keflex [Cephalexin]     Reflux   Latex Other (See Comments)    Rash   Ciprofloxacin Rash   Review of Systems: Constitutional:  no unexpected weight changes Eye:  no recent significant change in vision Ear/Nose/Mouth/Throat:  Ears:  no recent change in hearing Nose/Mouth/Throat:  no complaints of nasal congestion, no sore throat Cardiovascular: no chest  pain Respiratory:  no shortness of breath Gastrointestinal:  no abdominal pain, no change in bowel habits GU:  Female: negative for dysuria or pelvic pain Musculoskeletal/Extremities:  no pain of the joints Integumentary (Skin/Breast):  no abnormal skin lesions reported Neurologic:  no headaches Endocrine:  denies fatigue Hematologic/Lymphatic:  No areas of easy bleeding  Exam BP 100/84 (BP Location: Left Arm, Patient Position: Sitting, Cuff Size: Large)   Pulse 84   Temp 98.5 F (36.9 C) (Oral)   Resp 18   Ht 5\' 4"  (1.626 m)   Wt 257 lb 12.8 oz (116.9 kg)   SpO2 98%   BMI 44.25 kg/m  General:  well developed, well  nourished, in no apparent distress Skin:  no significant moles, warts, or growths Head:  no masses, lesions, or tenderness Eyes:  pupils equal and round, sclera anicteric without injection Ears:  canals without lesions, TMs shiny without retraction, no obvious effusion, no erythema Nose:  nares patent, septum midline, mucosa normal, and no drainage or sinus tenderness Throat/Pharynx:  lips and gingiva without lesion; tongue and uvula midline; non-inflamed pharynx; no exudates or postnasal drainage Neck: neck supple without adenopathy, thyromegaly, or masses Lungs:  clear to auscultation, breath sounds equal bilaterally, no respiratory distress Cardio:  regular rate and rhythm, no LE edema Abdomen:  abdomen soft, nontender; bowel sounds normal; no masses or organomegaly Genital: Defer to GYN Musculoskeletal: No CP or SOB. 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 female. Counseled on diet and exercise. Other orders as above. Chest pain is atypical, but given exertional nature I would like her to follow with cardiology out of caution.  Standing offer for MWM and/or bariatric surgery. She will let me know.  Follow up in 6 mo for med ck or prn. The patient voiced understanding and agreement to the plan.  [place], DO 11/19/20 3:57 PM

## 2020-11-19 NOTE — Patient Instructions (Addendum)
Give [place] 2-3 business days to get the results of your labs back.   Keep the diet clean and stay active.  I recommend getting the updated bivalent covid vaccination booster at your convenience.   If you do not hear anything about your referral in the next 1-2 weeks, call our office and ask for an update.  If you want to see a medical weight loss clinic or bariatric surgeon, send me a message.   Let us know if you need anything.  Pectoralis Major Rehab Ask your health care provider which exercises are safe for you. Do exercises exactly as told by your health care provider and adjust them as directed. It is normal to feel mild stretching, pulling, tightness, or discomfort as you do these exercises, but you should stop right away if you feel sudden pain or your pain gets worse. Do not begin these exercises until told by your health care provider. Stretching and range of motion exercises These exercises warm up your muscles and joints and improve the movement and flexibility of your shoulder. These exercises can also help to relieve pain, numbness, and tingling. Exercise A: Pendulum  Stand near a wall or a surface that you can hold onto for balance. Bend at the waist and let your left / right arm hang straight down. Use your other arm to keep your balance. Relax your arm and shoulder muscles, and move your hips and your trunk so your left / right arm swings freely. Your arm should swing because of the motion of your body, not because you are using your arm or shoulder muscles. Keep moving so your arm swings in the following directions, as told by your health care provider: Side to side. Forward and backward. In clockwise and counterclockwise circles. Slowly return to the starting position. Repeat 2 times. Complete this exercise 3 times per week. Exercise B: Abduction, standing Stand and hold a broomstick, a cane, or a similar object. Place your hands a little more than shoulder-width apart on  the object. Your left / right hand should be palm-up, and your other hand should be palm-down. While keeping your elbow straight and your shoulder muscles relaxed, push the stick across your body toward your left / right side. Raise your left / right arm to the side of your body and then over your head until you feel a stretch in your shoulder. Stop when you reach the angle that is recommended by your health care provider. Avoid shrugging your shoulder while you raise your arm. Keep your shoulder blade tucked down toward the middle of your spine. Hold for 10 seconds. Slowly return to the starting position. Repeat 2 times. Complete this exercise 3 times per week. Exercise C: Wand flexion, supine  Lie on your back. You may bend your knees for comfort. Hold a broomstick, a cane, or a similar object so that your hands are about shoulder-width apart on the object. Your palms should face toward your feet. Raise your left / right arm in front of your face, then behind your head (toward the floor). Use your other hand to help you do this. Stop when you feel a gentle stretch in your shoulder, or when you reach the angle that is recommended by your health care provider. Hold for 3 seconds. Use the broomstick and your other arm to help you return your left / right arm to the starting position. Repeat 2 times. Complete this exercise 3 times per week. Exercise D: Wand shoulder external rotation  Stand and hold a broomstick, a cane, or a similar object so your hands are about shoulder-width apart on the object. Start with your arms hanging down, then bend both elbows to an "L" shape (90 degrees). Keep your left / right elbow at your side. Use your other hand to push the stick so your left / right forearm moves away from your body, out to your side. Keep your left / right elbow bent to 90 degrees and keep it against your side. Stop when you feel a gentle stretch in your shoulder, or when you reach the angle  recommended by your health care provider. Hold for 10 seconds. Use the stick to help you return your left / right arm to the starting position. Repeat 2 times. Complete this exercise 3 times per week. Strengthening exercises These exercises build strength and endurance in your shoulder. Endurance is the ability to use your muscles for a long time, even after your muscles get tired. Exercise E: Scapular protraction, standing Stand so you are facing a wall. Place your feet about one arm-length away from the wall. Place your hands on the wall and straighten your elbows. Keep your hands on the wall as you push your upper back away from the wall. You should feel your shoulder blades sliding forward. Keep your elbows and your head still. If you are not sure that you are doing this exercise correctly, ask your health care provider for more instructions. Hold for 3 seconds. Slowly return to the starting position. Let your muscles relax completely before you repeat this exercise. Repeat 2 times. Complete this exercise 3 times per week. Exercise F: Shoulder blade squeezes  (scapular retraction) Sit with good posture in a stable chair. Do not let your back touch the back of the chair. Your arms should be at your sides with your elbows bent. You may rest your forearms on a pillow if that is more comfortable. Squeeze your shoulder blades together. Bring them down and back. Keep your shoulders level. Do not lift your shoulders up toward your ears. Hold for 3 seconds. Return to the starting position. Repeat 2 times. Complete this exercise 3 times per week. This information is not intended to replace advice given to you by your health care provider. Make sure you discuss any questions you have with your health care provider. Document Released: 01/16/2005 Document Revised: 10/28/2015 Document Reviewed: 10/04/2014 Elsevier Interactive Patient Education  [employer].

## 2020-11-20 LAB — LIPID PANEL
Cholesterol: 160 mg/dL (ref <200)
HDL: 42 mg/dL — ABNORMAL LOW (ref >=50)
LDL Cholesterol (Calc): 105 mg/dL (calc) — ABNORMAL HIGH
Non-HDL Cholesterol (Calc): 118 mg/dL (calc) (ref <130)
Total CHOL/HDL Ratio: 3.8 (calc) (ref <5.0)
Triglycerides: 43 mg/dL (ref <150)

## 2020-11-20 LAB — COMPREHENSIVE METABOLIC PANEL
AG Ratio: 1.3 (calc) (ref 1.0–2.5)
ALT: 14 U/L (ref 6–29)
AST: 15 U/L (ref 10–30)
Albumin: 4 g/dL (ref 3.6–5.1)
Alkaline phosphatase (APISO): 50 U/L (ref 31–125)
BUN: 14 mg/dL (ref 7–25)
CO2: 22 mmol/L (ref 20–32)
Calcium: 8.6 mg/dL (ref 8.6–10.2)
Chloride: 105 mmol/L (ref 98–110)
Creat: 0.75 mg/dL (ref 0.50–0.99)
Globulin: 3.2 g/dL (calc) (ref 1.9–3.7)
Glucose, Bld: 77 mg/dL (ref 65–99)
Potassium: 3.9 mmol/L (ref 3.5–5.3)
Sodium: 138 mmol/L (ref 135–146)
Total Bilirubin: 0.8 mg/dL (ref 0.2–1.2)
Total Protein: 7.2 g/dL (ref 6.1–8.1)

## 2020-11-20 LAB — CBC
HCT: 41.2 % (ref 35.0–45.0)
Hemoglobin: 13.5 g/dL (ref 11.7–15.5)
MCH: 27.9 pg (ref 27.0–33.0)
MCHC: 32.8 g/dL (ref 32.0–36.0)
MCV: 85.1 fL (ref 80.0–100.0)
MPV: 13.4 fL — ABNORMAL HIGH (ref 7.5–12.5)
Platelets: 212 10*3/uL (ref 140–400)
RBC: 4.84 10*6/uL (ref 3.80–5.10)
RDW: 13.5 % (ref 11.0–15.0)
WBC: 6.9 10*3/uL (ref 3.8–10.8)

## 2020-11-20 LAB — TSH: TSH: 2.05 mIU/L

## 2021-01-05 ENCOUNTER — Telehealth: Payer: Self-pay | Admitting: Neurology

## 2021-01-05 ENCOUNTER — Other Ambulatory Visit: Payer: Self-pay | Admitting: Family Medicine

## 2021-01-05 DIAGNOSIS — U071 COVID-19: Secondary | ICD-10-CM

## 2021-01-05 MED ORDER — DICYCLOMINE HCL 10 MG PO CAPS: ORAL_CAPSULE | ORAL | 0 refills | Status: DC

## 2021-01-05 NOTE — Telephone Encounter (Signed)
PA completed on [facility] [id number] Will await determination

## 2021-01-06 NOTE — Telephone Encounter (Signed)
Request Reference Number: [id number]. EMGALITY INJ 120MG /ML is approved through 01/05/2022.

## 2021-01-21 ENCOUNTER — Ambulatory Visit (INDEPENDENT_AMBULATORY_CARE_PROVIDER_SITE_OTHER): Payer: 59 | Admitting: Internal Medicine

## 2021-01-21 ENCOUNTER — Other Ambulatory Visit: Payer: Self-pay

## 2021-01-21 ENCOUNTER — Encounter: Payer: Self-pay | Admitting: Internal Medicine

## 2021-01-21 VITALS — BP 128/80 | HR 76 | Ht 64.0 in | Wt 262.2 lb

## 2021-01-21 DIAGNOSIS — R079 Chest pain, unspecified: Secondary | ICD-10-CM

## 2021-01-21 NOTE — Patient Instructions (Signed)
Medication Instructions:  Your physician recommends that you continue on your current medications as directed. Please refer to the Current Medication list given to you today.  *If you need a refill on your cardiac medications before your next appointment, please call your pharmacy*   Lab Work: NONE   If you have labs (blood work) drawn today and your tests are completely normal, you will receive your results only by: [place] (if you have MyChart) OR A paper copy in the mail If you have any lab test that is abnormal or we need to change your treatment, we will call you to review the results.   Testing/Procedures: Your physician has requested that you have a stress echocardiogram. For further information please visit [email or web address]. Please follow instruction sheet as given.    Follow-Up: At [facility], you and your health needs are our priority.  As part of our continuing mission to provide you with exceptional heart care, we have created designated Provider Care Teams.  These Care Teams include your primary Cardiologist (physician) and Advanced Practice Providers (APPs -  Physician Assistants and Nurse Practitioners) who all work together to provide you with the care you need, when you need it.  We recommend signing up for the patient portal called "MyChart".  Sign up information is provided on this After Visit Summary.  MyChart is used to connect with patients for Virtual Visits (Telemedicine).  Patients are able to view lab/test results, encounter notes, upcoming appointments, etc.  Non-urgent messages can be sent to your provider as well.   To learn more about what you can do with MyChart, go to [email or web address].    Your next appointment:    To Be Determined   The format for your next appointment:   In Person  Provider:   Dorris Carnes, MD    Other Instructions Thank you for choosing [facility]!

## 2021-01-21 NOTE — Progress Notes (Signed)
Cardiology Office Note   Date:  01/21/2021   ID:  Marie Rios, [name] [birth year], MRN [id number]  PCP:  Shelda Pal, DO  Cardiologist:   Dorris Carnes, MD    Pt referred for eval of CP   History of Present Illness: Marie Rios is a 40 y.o. female with a history of asthma, fibromyalgia, anxiety/depression  Patient seen for wellness exam recently   Told PCP she had sharp CP with exertion 2x per week  Patient says since Nov 2021 has beeing having CP   Squeezing, tight sensation.  Gets worse with activity  WIll have going up steps   Pt also uses inhlaer which can help    Symptoms got worse after COVID      Diet:  Br:   Crackers. Lunch:    Chicken, brocolli, salads Dinner   Same Snacks  Sometimes chips   Drinks   Water   One coke       Current Meds  Medication Sig   albuterol (PROVENTIL) (2.5 MG/3ML) 0.083% nebulizer solution Use every 4 hours while awake for 5 days. Then use every 6 hours as needed.   albuterol (VENTOLIN HFA) 108 (90 Base) MCG/ACT inhaler Inhale 2 puffs into the lungs every 6 (six) hours as needed for wheezing or shortness of breath.   Bismuth Subsalicylate (PEPTO-BISMOL PO) Take 1 tablet by mouth as needed.   dicyclomine (BENTYL) 10 MG capsule Take 1 tab every 6 hours as needed for abdominal cramping.   estrogens-methylTEST (ESTRATEST) 1.25-2.5 MG tablet Take 1 tablet by mouth daily.   fluticasone-salmeterol (ADVAIR HFA) 115-21 MCG/ACT inhaler Inhale 2 puffs into the lungs 2 (two) times daily.   Galcanezumab-gnlm (EMGALITY) 120 MG/ML SOAJ INJECT 120 MG INTO THE SKIN EVERY 30 (THIRTY) DAYS.   ipratropium (ATROVENT) 0.03 % nasal spray Place 2 sprays into both nostrils daily.   ondansetron (ZOFRAN-ODT) 4 MG disintegrating tablet Take 1 tablet (4 mg total) by mouth every 8 (eight) hours as needed for nausea or vomiting.   promethazine-dextromethorphan (PROMETHAZINE-DM) 6.25-15 MG/5ML syrup Take 5 mLs by mouth 4 (four) times daily as needed for  cough.   rizatriptan (MAXALT) 10 MG tablet Take 1 tablet (10 mg total) by mouth as needed for migraine. May repeat in 2 hours if needed. Max 2 doses in 24 hours. Appointment needed.     Allergies:   Gabapentin, Keflex [cephalexin], Latex, and Ciprofloxacin   Past Medical History:  Diagnosis Date   Anxiety    Asthma    Breast cyst, left 2019   Depression    Fibromyalgia    History of stomach ulcers    Lumbar herniated disc    Migraine    Seizures ([place])     Past Surgical History:  Procedure Laterality Date   APPENDECTOMY  1996   CHOLECYSTECTOMY  2003   LAPAROSCOPIC OOPHERECTOMY Right 2006   LAPAROSCOPIC OOPHERECTOMY Left 5784   pevlic surgery  6962   Plevic mass   TUBAL LIGATION  2003   VAGINAL HYSTERECTOMY  2005     Social History:  The patient  reports that she quit smoking about 4 years ago. Her smoking use included cigarettes. She smoked an average of .5 packs per day. She has never used smokeless tobacco. She reports that she does not currently use alcohol. She reports that she does not currently use drugs.   Family History:  The patient's family history includes Anemia in an other family member; Cancer in an other family member;  Diabetes in an other family member; Fibromyalgia in an other family member; Heart attack in her father; Heart disease in an other family member; Stroke in an other family member.  Father was 35      Paternal uncle with CAD   ROS:  Please see the history of present illness. All other systems are reviewed and  Negative to the above problem except as noted.    PHYSICAL EXAM: VS:  BP 128/80    Pulse 76    Ht 5\' 4"  (1.626 m)    Wt 262 lb 3.2 oz (118.9 kg)    SpO2 99%    BMI 45.01 kg/m   GEN: Morbidly obese 40 yo in no acute distress  HEENT: normal  Neck: no JVD, carotid bruits Cardiac: RRR; no murmurs  No LE  edema  Respiratory:  clear to auscultation bilaterally,  GI: soft, nontender, nondistended, + BS  No hepatomegaly  MS: no deformity  Moving all extremities   Skin: warm and dry, no rash Neuro:  Strength and sensation are intact Psych: euthymic mood, full affect   EKG:  EKG is ordered today.  NSR 75 bpm   Poor R wave progression.   Possible IWMI  Chest CT   12/07/19  1. No evidence of pulmonary embolism. 2. Mild atelectasis without other acute intrathoracic process. 3. Patient noted discomfort at the IV access site following injection. Patient was assessed by the CT technologist with findings relayed to the interpreting physician. Please see description below. Echo 12/08/19  1. Left ventricular ejection fraction, by estimation, is 65 to 70%. The left ventricle has normal function. Left ventricular endocardial border not optimally defined to evaluate regional wall motion. Left ventricular diastolic parameters are indeterminate. 2. Right ventricular systolic function is normal. The right ventricular size is mildly enlarged. Tricuspid regurgitation signal is inadequate for assessing PA pressure. 3. The mitral valve is normal in structure. No evidence of mitral valve regurgitation. No evidence of mitral stenosis. 4. The aortic valve was not well visualized. Aortic valve regurgitation is not visualized. No aortic stenosis is present.  Lipid Panel    Component Value Date/Time   CHOL 160 11/19/2020 1608   TRIG 43 11/19/2020 1608   HDL 42 (L) 11/19/2020 1608   CHOLHDL 3.8 11/19/2020 1608   LDLCALC 105 (H) 11/19/2020 1608      Wt Readings from Last 3 Encounters:  01/21/21 262 lb 3.2 oz (118.9 kg)  11/19/20 257 lb 12.8 oz (116.9 kg)  09/20/20 255 lb (115.7 kg)      ASSESSMENT AND PLAN:  1  Chest pain   Pt with CP/pressure  Not completely typical  Will have days she does things with no problem   Other days will have chest pressure    SHe does have a hx of asthma which may be contributing CT scan in 2021 showed no calcifications of arteries  Echo was normal Will sched for a stress echo to evaluate   2  Morbid  obesity   Discussed diet (limited carbs, low sugar, TRE)     F/U based on test results   Current medicines are reviewed at length with the patient today.  The patient does not have concerns regarding medicines.  Signed, Dorris Carnes, MD  01/21/2021 2:02 PM    [facility] [place], [place], [place]  [address] Phone: [phone]; Fax: [phone]

## 2021-01-26 IMAGING — XA Imaging study
2 series · 2 of 2 positions shown · non-contrast
Comparison: none

CLINICAL DATA: Spinal headache after lumbar puncture on 10/04/2018.

[Series 1: ortho standard · 1 of 1 slices shown (1 of 2)]
[im 1/1]
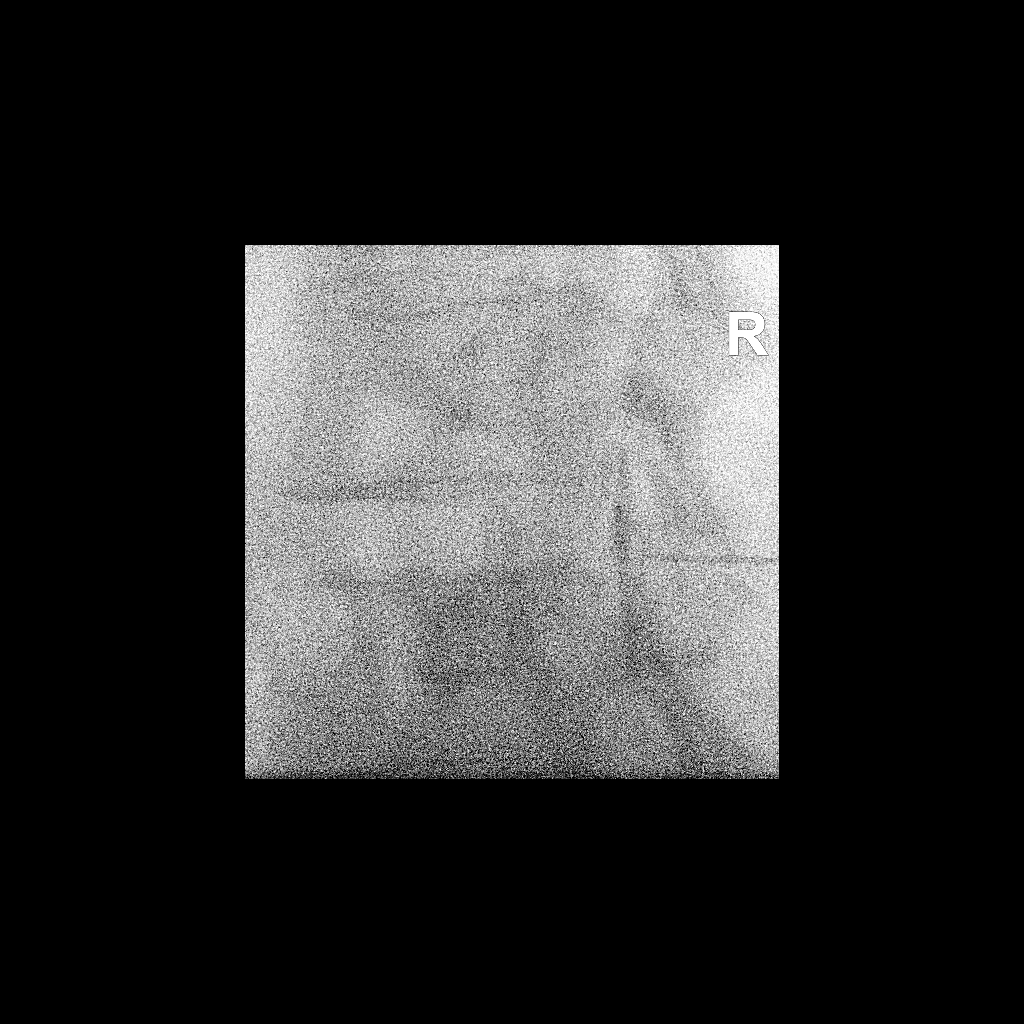

[Series 2: ortho standard · 1 of 1 slices shown (2 of 2)]
[im 1/1]
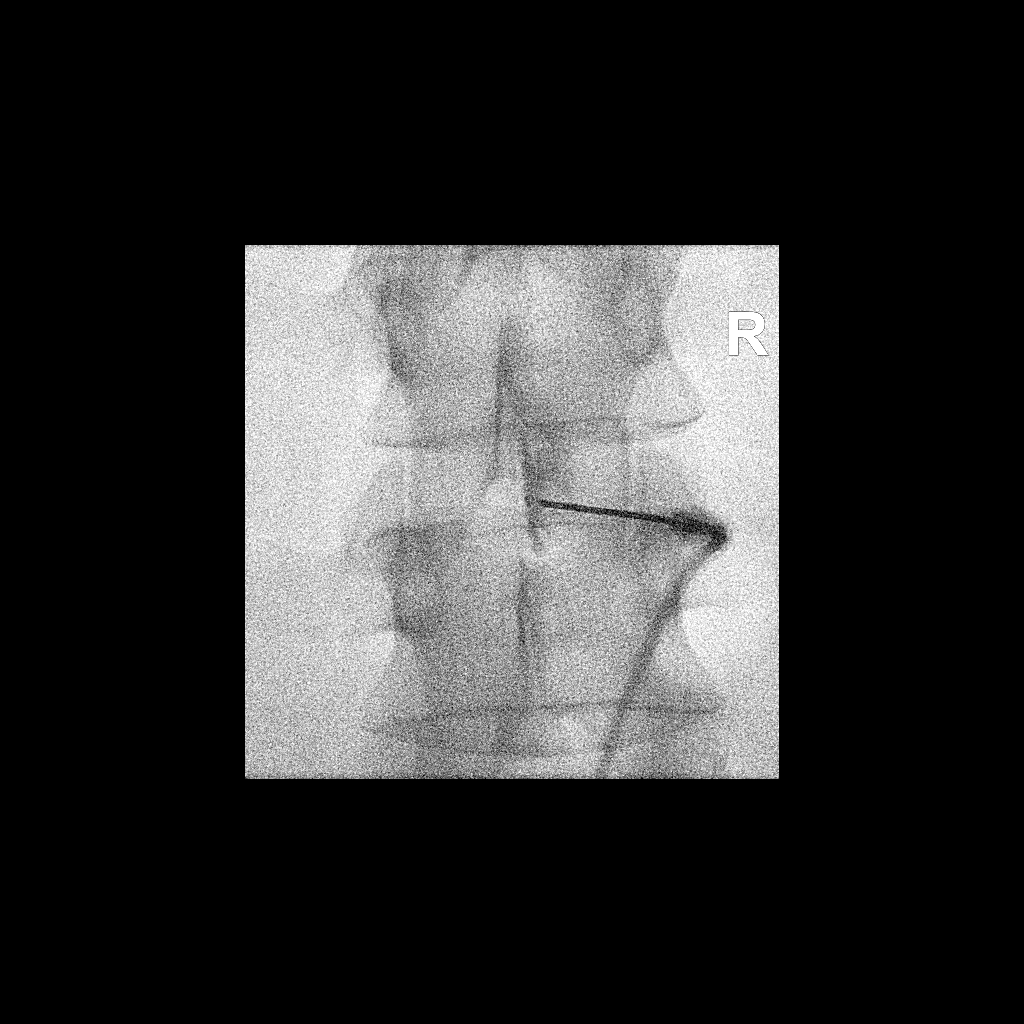

[2 of 2 positions shown; findings below may reference images not displayed]

EXAM:
LUMBAR EPIDURAL BLOOD PATCH INJECTION

FLUOROSCOPY TIME:  Radiation Exposure Index (as provided by the
fluoroscopic device): 2.5 mGy

Fluoroscopy Time:  5 seconds

Number of Acquired Images:  0

PROCEDURE:
After a thorough discussion of risks and benefits of the procedure,
written and verbal consent was obtained. Specific risks included
puncture of the thecal sac and dura as well as non-therapeutic
injection with general risks of bleeding, infection, injury to
nerves, blood vessels, and adjacent structures. Verbal consent was
obtained by Dr. Oro. We discussed the medium to high probability
of achievement of therapeutic goal, dependent on volume of infusion.

Prior to the procedure, 20 ml of the patient's blood was harvested
using stringent sterile technique.

An interlaminar approach was performed at L2-L3 on the right. Under
stringent sterile technique, overlying skin was cleansed with
betadine soap and anesthetized with 1% lidocaine without
epinephrine. A 3.5 inch 20 gauge epidural needle was advanced using
loss-of-resistance technique.

DIAGNOSTIC EPIDURAL INJECTION

Injection of Isovue-M 200 shows a good epidural pattern with spread
above and below the level of needle placement, primarily on the
right. No vascular or subarachnoid opacification is seen.

THERAPEUTIC EPIDURAL INJECTION

15 ml of the patient's blood was injected into the epidural space at
the site of prior lumbar puncture.
IMPRESSION: Technically successful lumbar blood patch at L2-L3.

## 2021-02-19 IMAGING — US US BREAST*L* LIMITED INC AXILLA
1 series · 10 of 10 positions shown · non-contrast
Comparison: Previous exam(s).

CLINICAL DATA: Follow-up of a probable benign fibroadenoma and
cluster of cysts in the left breast.

EXAM:
DIGITAL DIAGNOSTIC BILATERAL MAMMOGRAM WITH CAD AND TOMO
ULTRASOUND LEFT BREAST

[Series 1: us breast*left* limited inc axilla · 0.06mm/px · 10 of 10 slices shown]
[im 1/10]
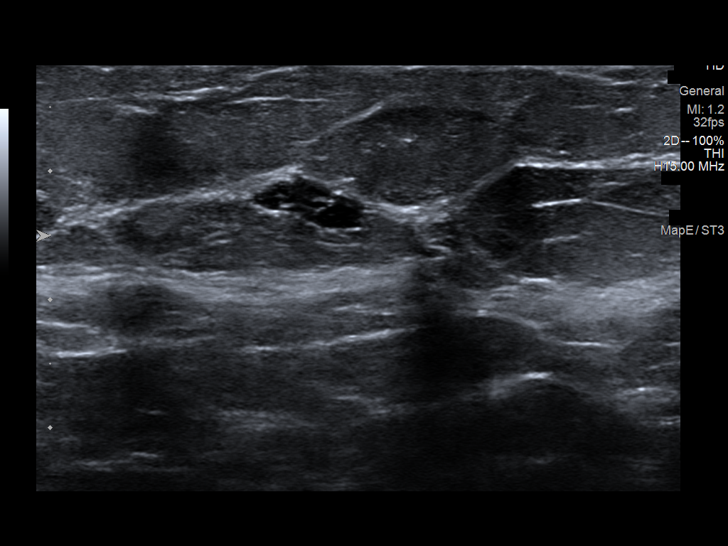
[im 2/10]
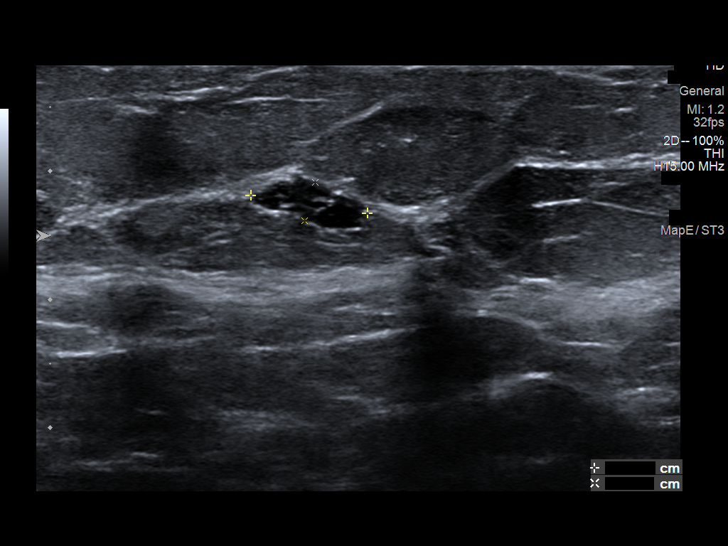
[im 3/10]
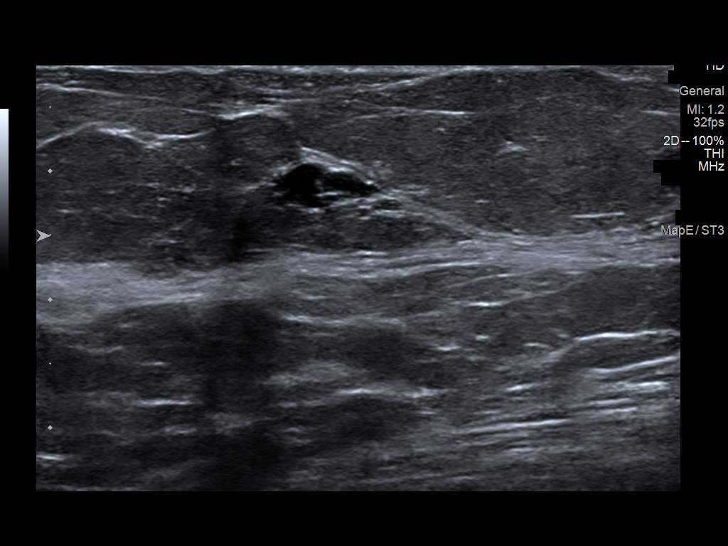
[im 4/10]
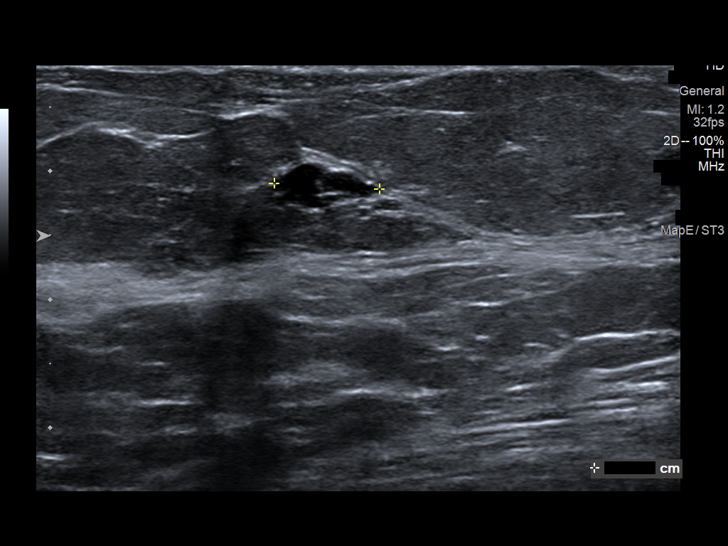
[im 5/10]
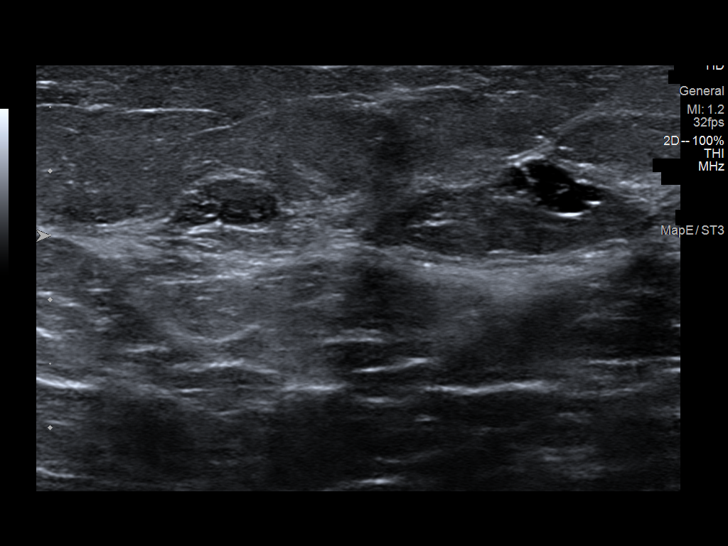
[im 6/10]
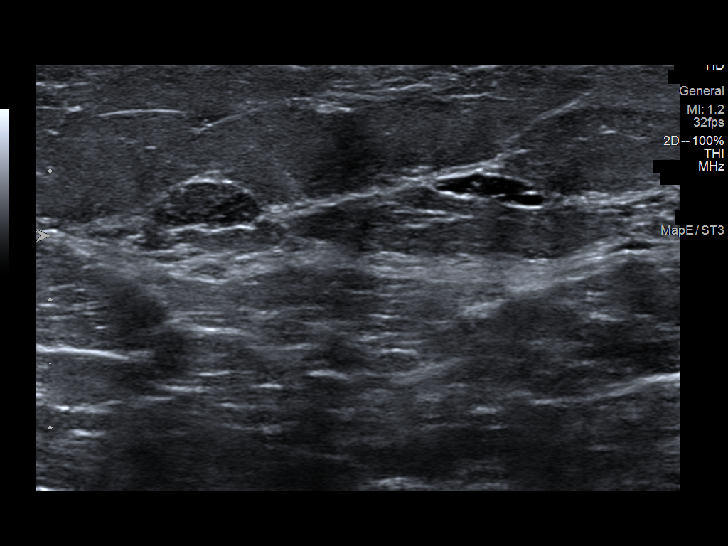
[im 7/10]
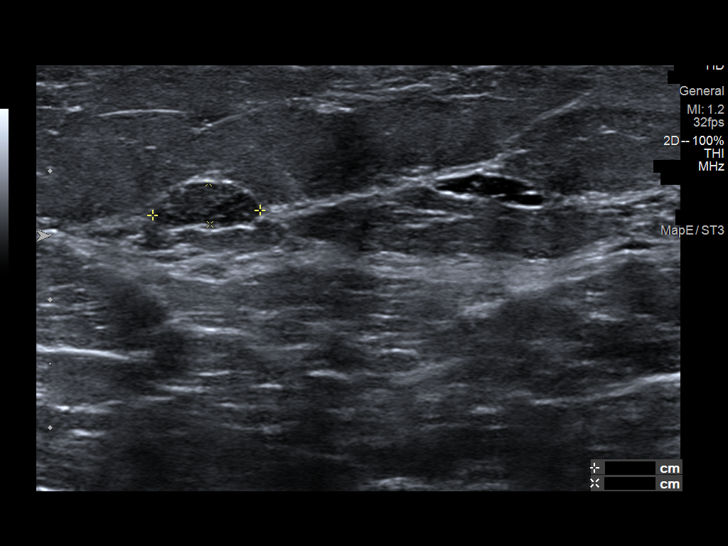
[im 8/10]
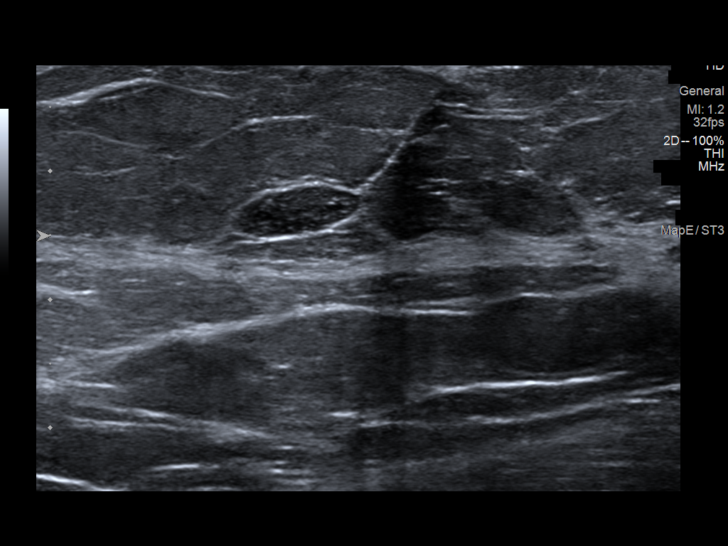
[im 9/10]
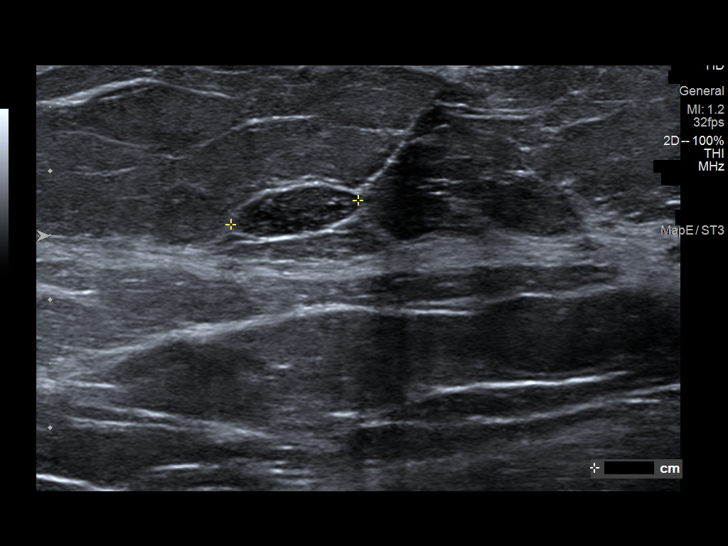
[im 10/10]
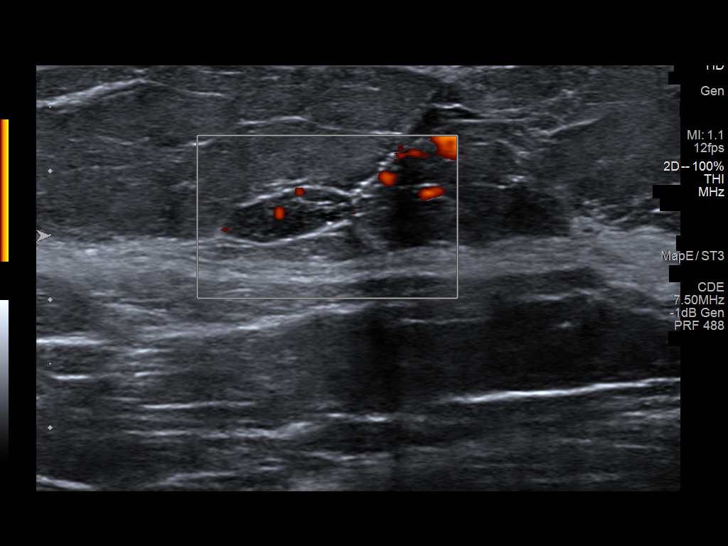

[10 of 10 positions shown; findings below may reference images not displayed]

ACR Breast Density Category b: There are scattered areas of
fibroglandular density.
FINDINGS: 7 mm ovoid mass in the upper-outer quadrant of the left breast is
stable compared to prior exams dating back to 07/22/2015. No
suspicious mass or malignant type microcalcifications identified in
either breast.

Mammographic images were processed with CAD.

Targeted ultrasound is performed, showing there is a cluster of near
anechoic cysts in the left breast at 2 o'clock 7 cm from the nipple
measuring 9 x 3 x 8 mm. On the prior ultrasound dated 02/08/2016 it
measured 9 x 3 x 9 mm. There is a well-circumscribed mass in the
left breast at 2 o'clock 7 cm from the nipple measuring 8 x 3 x 10
mm. On the prior outside images dated 02/08/2016 it measured 9 x 4 x
11 mm. The previously seen third mass at 2-3 o'clock 6 cm from the
nipple that looked like a benign intramammary lymph node measuring 4
x 2 x 4 mm is no longer visualized.
IMPRESSION: Stable cluster of cysts and fibroadenoma in the left breast. No
evidence of malignancy in either breast.

RECOMMENDATION:
Bilateral screening mammogram in 1 year is recommended.

I have discussed the findings and recommendations with the patient.
If applicable, a reminder letter will be sent to the patient
regarding the next appointment.

BI-RADS CATEGORY  2: Benign.

## 2021-02-22 ENCOUNTER — Encounter: Payer: Self-pay | Admitting: Internal Medicine

## 2021-02-22 NOTE — Telephone Encounter (Signed)
Can follow up in May

## 2021-02-28 ENCOUNTER — Ambulatory Visit (HOSPITAL_COMMUNITY): Admission: RE | Admit: 2021-02-28 | Payer: 59 | Source: Ambulatory Visit

## 2021-05-20 ENCOUNTER — Ambulatory Visit: Payer: 59 | Admitting: Family Medicine

## 2021-05-20 ENCOUNTER — Encounter: Payer: Self-pay | Admitting: Family Medicine

## 2021-05-20 ENCOUNTER — Telehealth: Payer: 59 | Admitting: Family Medicine

## 2021-05-20 VITALS — BP 122/77 | Ht 64.0 in | Wt 226.0 lb

## 2021-05-20 DIAGNOSIS — K582 Mixed irritable bowel syndrome: Secondary | ICD-10-CM

## 2021-05-20 DIAGNOSIS — Z86010 Personal history of colonic polyps: Secondary | ICD-10-CM | POA: Diagnosis not present

## 2021-05-20 DIAGNOSIS — J4520 Mild intermittent asthma, uncomplicated: Secondary | ICD-10-CM

## 2021-05-20 MED ORDER — HUMIRA 40 MG/0.8ML ~~LOC~~ PSKT: 40.0000 mg | PREFILLED_SYRINGE | SUBCUTANEOUS | Status: AC

## 2021-05-20 MED ORDER — SPIRONOLACTONE 50 MG PO TABS: 50.0000 mg | ORAL_TABLET | Freq: Every day | ORAL | Status: DC

## 2021-05-20 NOTE — Progress Notes (Signed)
Chief Complaint  ?Patient presents with  ? Follow-up  ?  6 month  ? ? ?Subjective: ?Patient is a 41 y.o. female here for abd pain. Due to COVID-19 pandemic, we are interacting via web portal for an electronic face-to-face visit. I verified patient's ID using 2 identifiers. Patient agreed to proceed with visit via this method. Patient is at home, I am at office. Patient and I are present for visit.  ? ?Having alternating constipation and loose stools. Going on for a couple years. Not getting better. No bleeding, unintentional wt loss, nighttime awakenings, fevers. Stress does make her symptoms. Was dx'd w IBS in the past. Bentyl has helped in the past. Had a largely normal colonoscopy around 8-9 years ago. Had some polyps and diverticulosis.  ? ?Past Medical History:  ?Diagnosis Date  ? Anxiety   ? Asthma   ? Breast cyst, left 2019  ? Depression   ? Fibromyalgia   ? History of stomach ulcers   ? Lumbar herniated disc   ? Migraine   ? Seizures ([place])   ? ? ?Objective: ?BP 122/77 Comment: checked the other day  Ht '5\' 4"'$  (1.626 m)   Wt 226 lb (102.5 kg)   BMI 38.79 kg/m?  ?No conversational dyspnea ?Age appropriate judgment and insight ?Nml affect and mood ? ?Assessment and Plan: ?Irritable bowel syndrome with both constipation and diarrhea ? ?History of colon polyps - Plan: Ambulatory referral to Gastroenterology ? ?Mild intermittent asthma without complication ? ?Chronic, not controlled. Given polyps in hx, will refer to GI. Offered daily med in addition to Bentyl 10 mg QID prn, but she politely declined. Discussed IBGard, will likely hold off prior to GI appt.  ?F/u in 6 mo for CPE or prn. ?The patient voiced understanding and agreement to the plan. ? ?Shelda Pal, DO ?05/20/21  ?12:51 PM ? ? ? ? ?

## 2021-06-01 ENCOUNTER — Encounter: Payer: Self-pay | Admitting: Internal Medicine

## 2021-06-03 ENCOUNTER — Encounter: Payer: Self-pay | Admitting: Family Medicine

## 2021-06-07 ENCOUNTER — Encounter: Payer: Self-pay | Admitting: Gastroenterology

## 2021-06-08 ENCOUNTER — Ambulatory Visit: Payer: 59 | Admitting: Internal Medicine

## 2021-06-30 ENCOUNTER — Ambulatory Visit: Payer: 59 | Admitting: Gastroenterology

## 2021-06-30 ENCOUNTER — Encounter: Payer: Self-pay | Admitting: Gastroenterology

## 2021-06-30 VITALS — BP 118/70 | HR 64 | Ht 64.0 in | Wt 224.5 lb

## 2021-06-30 DIAGNOSIS — K582 Mixed irritable bowel syndrome: Secondary | ICD-10-CM | POA: Diagnosis not present

## 2021-06-30 MED ORDER — CHOLESTYRAMINE 4 G PO PACK: 4.0000 g | PACK | Freq: Three times a day (TID) | ORAL | 12 refills | Status: AC

## 2021-06-30 NOTE — Progress Notes (Signed)
HPI : Marie Rios is a very pleasant 41 year old female with a history of  anxiety, depression, fibromyalgia and IBS who is referred to [place] by Dr. Riki Sheer for further management of her IBS.  She describes a long standing problem with multiple GI symptoms, primarily crampy lower abdominal pain and irregular bowel habits alternating between constipation and diarrhea.   When constipated, she will go up to 4 days without a bowel movement.  Her stools are often small and hard and difficult to pass.  She typically feels very bloated the longer she goes without a bowel movement. She will then have several days of diarrhea, going up to six times per day.  When she has diarrhea is when her crampy abdominal pain becomes particularly bothersome.  She typically has urgency but no incontinence. She is often very gassy during  her diarrheal episodes. She does not see any blood in her stool except for rare occasions when she has had frequent bowel movements and will see small amounts of blood on the toilet paper. She very rarely will have normal formed stools which are soft and easy to pass.   She has not noticed any dietary triggers or patterns to her bowel habit changes. She estimates she has constipation about 60% of the time and diarrhea 40% of the time.    She has tried taking loperamide, but if she takes even one tablet she will become constipated and very bloated.  Bentyl is quite helpful for her crampy abdominal pain. She remembers taking metamucil in the past but didn't tolerate it well.   She has tried changing her diet by increasing consumption of fruits and vegetables, but otherwise denies making any major dietary changes.    Past Medical History:  Diagnosis Date   Anxiety    Asthma    Breast cyst, left 2019   Depression    Fibromyalgia    History of stomach ulcers    Lumbar herniated disc    Migraine    Seizures ([place])      Past Surgical History:  Procedure Laterality Date    APPENDECTOMY  1996   CHOLECYSTECTOMY  2003   LAPAROSCOPIC OOPHERECTOMY Right 2006   LAPAROSCOPIC OOPHERECTOMY Left 8315   pevlic surgery  1761   Plevic mass   TUBAL LIGATION  2003   VAGINAL HYSTERECTOMY  2005   Family History  Problem Relation Age of Onset   Heart attack Father        nov 2018   Diabetes Other    Cancer Other    Heart disease Other    Stroke Other    Anemia Other    Fibromyalgia Other    Stomach cancer Paternal Great-grandmother    Seizures Neg Hx    Migraines Neg Hx    Colon cancer Neg Hx    Rectal cancer Neg Hx    Esophageal cancer Neg Hx    Social History   Tobacco Use   Smoking status: Former    Packs/day: 0.50    Types: Cigarettes    Quit date: 2018    Years since quitting: 5.4   Smokeless tobacco: Never   Tobacco comments:    black & mild  Vaping Use   Vaping Use: Former  Substance Use Topics   Alcohol use: Not Currently    Comment: was social, no longer drinking    Drug use: Not Currently    Comment: Marijuana- ocassionally (07/21/15)   Current Outpatient Medications  Medication Sig  Dispense Refill   Adalimumab (HUMIRA) 40 MG/0.8ML PSKT Inject 0.8 mLs (40 mg total) into the skin once a week. 2 each    albuterol (PROVENTIL) (2.5 MG/3ML) 0.083% nebulizer solution Use every 4 hours while awake for 5 days. Then use every 6 hours as needed. 150 mL 1   albuterol (VENTOLIN HFA) 108 (90 Base) MCG/ACT inhaler Inhale 2 puffs into the lungs every 6 (six) hours as needed for wheezing or shortness of breath. 8 g 2   dicyclomine (BENTYL) 10 MG capsule Take 1 tab every 6 hours as needed for abdominal cramping. 60 capsule 0   estrogens-methylTEST (ESTRATEST) 1.25-2.5 MG tablet Take 1 tablet by mouth daily. 90 tablet 4   fluticasone-salmeterol (ADVAIR HFA) 115-21 MCG/ACT inhaler Inhale 2 puffs into the lungs 2 (two) times daily. 1 each 1   Galcanezumab-gnlm (EMGALITY) 120 MG/ML SOAJ INJECT 120 MG INTO THE SKIN EVERY 30 (THIRTY) DAYS. 3 mL 3   ipratropium  (ATROVENT) 0.03 % nasal spray Place 2 sprays into both nostrils daily.     ondansetron (ZOFRAN-ODT) 4 MG disintegrating tablet Take 1 tablet (4 mg total) by mouth every 8 (eight) hours as needed for nausea or vomiting. 20 tablet 11   rizatriptan (MAXALT) 10 MG tablet Take 1 tablet (10 mg total) by mouth as needed for migraine. May repeat in 2 hours if needed. Max 2 doses in 24 hours. Appointment needed. 10 tablet 2   spironolactone (ALDACTONE) 50 MG tablet Take 1 tablet (50 mg total) by mouth daily.     No current facility-administered medications for this visit.   Allergies  Allergen Reactions   Gabapentin     Intolerant.    Keflex [Cephalexin]     Reflux   Latex Other (See Comments)    Rash   Ciprofloxacin Rash     Review of Systems: All systems reviewed and negative except where noted in HPI.    No results found.  Physical Exam: BP 118/70   Pulse 64   Ht '5\' 4"'$  (1.626 m)   Wt 224 lb 8 oz (101.8 kg)   SpO2 98%   BMI 38.54 kg/m  Constitutional: Pleasant,well-developed, African American female in no acute distress.  Accompanied by husband HEENT: Normocephalic and atraumatic. Conjunctivae are normal. No scleral icterus. Neck supple.  Cardiovascular: Normal rate, regular rhythm.  Pulmonary/chest: Effort normal and breath sounds normal. No wheezing, rales or rhonchi. Abdominal: Soft, nondistended, mild tenderness to palpation in bilateral lower quadrants without rigidity or guarding. Bowel sounds active throughout. There are no masses palpable. No hepatomegaly. Extremities: no edema Neurological: Alert and oriented to person place and time. Skin: Skin is warm and dry. No rashes noted. Psychiatric: Normal mood and affect. Behavior is normal.  CBC    Component Value Date/Time   WBC 6.9 11/19/2020 1608   RBC 4.84 11/19/2020 1608   HGB 13.5 11/19/2020 1608   HGB 12.7 09/19/2017 0943   HCT 41.2 11/19/2020 1608   HCT 39.1 09/19/2017 0943   PLT 212 11/19/2020 1608   PLT 217  09/19/2017 0943   MCV 85.1 11/19/2020 1608   MCV 82 09/19/2017 0943   MCH 27.9 11/19/2020 1608   MCHC 32.8 11/19/2020 1608   RDW 13.5 11/19/2020 1608   RDW 14.9 09/19/2017 0943   LYMPHSABS 2.2 12/09/2019 0609   MONOABS 0.6 12/09/2019 0609   EOSABS 0.0 12/09/2019 0609   BASOSABS 0.0 12/09/2019 0609    CMP     Component Value Date/Time   NA 138 11/19/2020  1608   NA 143 09/19/2017 0943   K 3.9 11/19/2020 1608   CL 105 11/19/2020 1608   CO2 22 11/19/2020 1608   GLUCOSE 77 11/19/2020 1608   BUN 14 11/19/2020 1608   BUN 10 09/19/2017 0943   CREATININE 0.75 11/19/2020 1608   CALCIUM 8.6 11/19/2020 1608   PROT 7.2 11/19/2020 1608   PROT 7.2 09/19/2017 0943   ALBUMIN 3.3 (L) 12/09/2019 0609   ALBUMIN 4.3 09/19/2017 0943   AST 15 11/19/2020 1608   ALT 14 11/19/2020 1608   ALKPHOS 48 12/09/2019 0609   BILITOT 0.8 11/19/2020 1608   BILITOT 0.4 09/19/2017 0943   GFRNONAA >60 12/11/2019 0453   GFRAA 131 09/19/2017 0943     ASSESSMENT AND PLAN: 41 year old female with long standing mixed IBS.  She has a history of a cholecystectomy and her diarrhea episodes have not responded well to loperamide (overly sensitive).  She did not tolerate metamucil well. Bentyl does work well for her pain. I suggested she try cholestyramine for her diarrhea episodes and to try another fiber supplement such as Citrucel to better maintain regularity.  Continue bentyl as needed for pain. We also discussed the principles of a low FODMAP diet and she is interested in perhaps pursuing it further. Recommended senna as needed for constipation (when no BM in 2-3 days)  IBS-M - Cholestyramine for diarrhea episodes instead of loperamide - Citrucel and increase dietary fiber - Continue bentyl - Low FODMAP diet - Senna PRN - Follow up 8-12 weeks  Lattie Riege E. Candis Schatz, MD [facility]  CC:  Shelda Pal*

## 2021-06-30 NOTE — Patient Instructions (Addendum)
If you are age 42 or older, your body mass index should be between 23-30. Your Body mass index is 38.54 kg/m. If this is out of the aforementioned range listed, please consider follow up with your Primary Care Provider.  If you are age 33 or younger, your body mass index should be between 19-25. Your Body mass index is 38.54 kg/m. If this is out of the aformentioned range listed, please consider follow up with your Primary Care Provider.   We have sent the following medications to your pharmacy for you to pick up at your convenience: Cholestyramine 4 grams as needed for diarrhea.  Increase to dietary fiber.  Start Citrucel daily and follow low fodmap diet.  The [facility] providers would like to encourage you to use [facility] to communicate with providers for non-urgent requests or questions.  Due to long hold times on the telephone, sending your provider a message by [facility] may be a faster and more efficient way to get a response.  Please allow 48 business hours for a response.  Please remember that this is for non-urgent requests.   It was a pleasure to see you today!  Thank you for trusting me with your gastrointestinal care!    Scott E.Candis Schatz, MD

## 2021-08-24 ENCOUNTER — Telehealth: Payer: Self-pay | Admitting: Family Medicine

## 2021-08-24 MED ORDER — METHYLPREDNISOLONE 4 MG PO TBPK: ORAL_TABLET | ORAL | 0 refills | Status: DC

## 2021-08-24 NOTE — Telephone Encounter (Signed)
Pt called stating that she has been having a migraine for the last couple of days and not even medication is helping. Pt would like to discuss with RN.

## 2021-08-24 NOTE — Telephone Encounter (Signed)
Reviewed pt chart. Last seen 09/20/20. Next yearly f/u scheduled for 09/20/21. She is currently on Emgality inj monthly and rizatriptan prn.Also on OSA on CPAP. Report below. Compliance good, AHI good. No air leaks.  I called pt. She reports migraine started this past Monday. Tried rizatriptan Monday (2 doses), Took another 2 doses of rizatriptan yesterday. Took zofran each time for nausea.. Last Emgality 08/19/21. Has not taken any other meds. Pain located above right eye and radiates to the back of head. Pain rated as 8 out of 1-10 pain scale. Denies any sickness/infection. No increased stress currently. Has not done anything outside her normal routine. Reports she is also dizzy. This has occurred with her migraines before. When she stands, rooms spins. Aware I will send to NP for review to see what she would recommend and will then call her back.

## 2021-08-24 NOTE — Telephone Encounter (Signed)
Called pt back. Relayed Marie Rios's recommendation. Pt chose to try steroid pack. E-scribed to [facility] AT [place] per pt request. She will call back if migraine persists after finishing.  Advised she may need to be worked in for sooner appt if migraine continues.

## 2021-08-29 NOTE — Progress Notes (Unsigned)
PATIENT: Marie Rios DOB: Jul 09, 1980  REASON FOR VISIT: follow up HISTORY FROM: patient  Virtual Visit via Telephone Note  I connected with [name] on 08/30/21 at  9:30 AM EDT by telephone and verified that I am speaking with the correct person using two identifiers.   I discussed the limitations, risks, security and privacy concerns of performing an evaluation and management service by telephone and the availability of in person appointments. I also discussed with the patient that there may be a patient responsible charge related to this service. The patient expressed understanding and agreed to proceed.   History of Present Illness:  08/30/21 ALL ([place]): Marie Rios is a 41 y.o. female here today for follow up for migraines and OSA on CPAP. She continues Emgality and rizatriptan. She has been doing very well. Maybe 1-2 headache days a month. Rizatriptan usually works well for abortive therapy. She reports having a intractable migraine lasting about a week. She took a medrol dose pack with OTC analgesics. Pain is better. She continues to have a dull lingering headache. She reports having some allergy symptoms a couple weeks ago but those have resolved. She also report spironolactone dose was increased to '75mg'$ . She started this dose last week. She is having more constipation. She was advised to increase Citrucel and fiber in diet. She is drinking 7-8 glasses of water daily.   She continues CPAP. No concerns with machine or supplies.      09/20/2020 ALL: Maddisyn returns for follow up for migraines ([place]) and OSA on CPAP ([place]). She continues Teaching laboratory technician and rizatrptan. She reports headaches are better on Emgality. She does continue to have about 15 headache days. About 7 migraine days. Migraines are usually easily aborted with rizatriptan. She may take 4-5 times a month. She reports weight gain over the past year. She is using intermittent fasting with diet changes but  not able to lose weight. Review of chart shows that she lost 25 pounds after being on Emgality for 1 year. She has gained about 45 pounds over the past year. She is now on estrogen replacement. She is hesitant to make a change as Emgality has worked so well.   Observations/Objective:  Generalized: Well developed, in no acute distress  Mentation: Alert oriented to time, place, history taking. Follows all commands speech and language fluent   Assessment and Plan:  41 y.o. year old female  has a past medical history of Anxiety, Asthma, Breast cyst, left (2019), Depression, Fibromyalgia, History of stomach ulcers, Lumbar herniated disc, Migraine, and Seizures ([place]). here with     ICD-10-CM   1. OSA on CPAP  G47.33 For home use only DME continuous positive airway pressure (CPAP)   Z99.89     2. Migraine without aura and without status migrainosus, not intractable  G43.009       [name] reports migraines are usually well managed and tolerating Emgality and rizatriptan. We will continue current plan. I will give her naratriptan to use for prolonged headache symptoms. Appropriate dosing discussed. She was encouraged to focus on getting at least 60-70 ounces of water every day. May adust prevention meds if headaches become more frequent. Healthy lifestyle habits encouraged. CPAP compliance is excellent. Follow up in 1 year.    Orders Placed This Encounter  Procedures   For home use only DME continuous positive airway pressure (CPAP)    Supplies    Order Specific Question:   Length of Need    Answer:  Lifetime    Order Specific Question:   Patient has OSA or probable OSA    Answer:   Yes    Order Specific Question:   Is the patient currently using CPAP in the home    Answer:   Yes    Order Specific Question:   Settings    Answer:   Other see comments    Order Specific Question:   CPAP supplies needed    Answer:   Mask, headgear, cushions, filters, heated tubing and water chamber     Meds ordered this encounter  Medications   naratriptan (AMERGE) 2.5 MG tablet    Sig: Take 1 tablet (2.5 mg total) by mouth as needed for migraine. Take one (1) tablet at onset of headache; if returns or does not resolve, may repeat after 4 hours; do not exceed five (5) mg in 24 hours.    Dispense:  10 tablet    Refill:  0    Order Specific Question:   Supervising Provider    Answer:   Melvenia Beam [[id number]]   Galcanezumab-gnlm (EMGALITY) 120 MG/ML SOAJ    Sig: INJECT 120 MG INTO THE SKIN EVERY 30 (THIRTY) DAYS.    Dispense:  3 mL    Refill:  3    Order Specific Question:   Supervising Provider    Answer:   Melvenia Beam [[id number]]   rizatriptan (MAXALT) 10 MG tablet    Sig: Take 1 tablet (10 mg total) by mouth as needed for migraine. May repeat in 2 hours if needed. Max 2 doses in 24 hours. Appointment needed.    Dispense:  10 tablet    Refill:  2    Order Specific Question:   Supervising Provider    Answer:   Melvenia Beam [id number]     Follow Up Instructions:  I discussed the assessment and treatment plan with the patient. The patient was provided an opportunity to ask questions and all were answered. The patient agreed with the plan and demonstrated an understanding of the instructions.   The patient was advised to call back or seek an in-person evaluation if the symptoms worsen or if the condition fails to improve as anticipated.  I provided 15 minutes of non-face-to-face time during this encounter. Patient located at their place of residence during [place] visit. Provider is in the office.    Debbora Presto, NP

## 2021-08-29 NOTE — Telephone Encounter (Signed)
Called pt. She lives in [place]. Hard for her to get here for morning appt. Scheduled virtual visit tomorrow at 930am with AL,NP to discuss medication management for ongoing migraines. She will log on about 15 min prior.

## 2021-08-29 NOTE — Telephone Encounter (Signed)
Pt states she is having the same migraine from last week, she is asking for a call from RN

## 2021-08-30 ENCOUNTER — Encounter: Payer: Self-pay | Admitting: Family Medicine

## 2021-08-30 ENCOUNTER — Telehealth: Payer: 59 | Admitting: Family Medicine

## 2021-08-30 DIAGNOSIS — G4733 Obstructive sleep apnea (adult) (pediatric): Secondary | ICD-10-CM

## 2021-08-30 DIAGNOSIS — Z9989 Dependence on other enabling machines and devices: Secondary | ICD-10-CM

## 2021-08-30 DIAGNOSIS — G43009 Migraine without aura, not intractable, without status migrainosus: Secondary | ICD-10-CM

## 2021-08-30 MED ORDER — NARATRIPTAN HCL 2.5 MG PO TABS
2.5000 mg | ORAL_TABLET | ORAL | 0 refills | Status: DC | PRN
Start: 2021-08-30 — End: 2023-09-12

## 2021-08-30 MED ORDER — EMGALITY 120 MG/ML ~~LOC~~ SOAJ: SUBCUTANEOUS | 3 refills | Status: DC

## 2021-08-30 MED ORDER — RIZATRIPTAN BENZOATE 10 MG PO TABS
10.0000 mg | ORAL_TABLET | ORAL | 2 refills | Status: DC | PRN
Start: 2021-08-30 — End: 2022-09-05

## 2021-08-30 NOTE — Progress Notes (Signed)
New order faxed to DME: [facility]   [phone]  ? ?Fax confirmation received  ?

## 2021-09-20 ENCOUNTER — Ambulatory Visit: Payer: 59 | Admitting: Family Medicine

## 2022-01-16 ENCOUNTER — Ambulatory Visit (INDEPENDENT_AMBULATORY_CARE_PROVIDER_SITE_OTHER): Payer: 59 | Admitting: Family Medicine

## 2022-01-16 ENCOUNTER — Encounter: Payer: Self-pay | Admitting: Family Medicine

## 2022-01-16 VITALS — BP 108/68 | HR 88 | Temp 97.9°F | Ht 64.0 in | Wt 217.0 lb

## 2022-01-16 DIAGNOSIS — Z0000 Encounter for general adult medical examination without abnormal findings: Secondary | ICD-10-CM | POA: Diagnosis not present

## 2022-01-16 DIAGNOSIS — Z23 Encounter for immunization: Secondary | ICD-10-CM

## 2022-01-16 LAB — LIPID PANEL
Cholesterol: 199 mg/dL (ref 0–200)
HDL: 67.3 mg/dL (ref >39.00)
LDL Cholesterol: 118 mg/dL — ABNORMAL HIGH (ref 0–99)
NonHDL: 131.96
Total CHOL/HDL Ratio: 3
Triglycerides: 69 mg/dL (ref 0.0–149.0)
VLDL: 13.8 mg/dL (ref 0.0–40.0)

## 2022-01-16 LAB — CBC
HCT: 40.5 % (ref 36.0–46.0)
Hemoglobin: 13.7 g/dL (ref 12.0–15.0)
MCHC: 33.9 g/dL (ref 30.0–36.0)
MCV: 90.1 fl (ref 78.0–100.0)
Platelets: 254 10*3/uL (ref 150.0–400.0)
RBC: 4.5 Mil/uL (ref 3.87–5.11)
RDW: 13.1 % (ref 11.5–15.5)
WBC: 5.3 10*3/uL (ref 4.0–10.5)

## 2022-01-16 LAB — COMPREHENSIVE METABOLIC PANEL
ALT: 12 U/L (ref 0–35)
AST: 13 U/L (ref 0–37)
Albumin: 4.2 g/dL (ref 3.5–5.2)
Alkaline Phosphatase: 33 U/L — ABNORMAL LOW (ref 39–117)
BUN: 17 mg/dL (ref 6–23)
CO2: 28 mEq/L (ref 19–32)
Calcium: 9.2 mg/dL (ref 8.4–10.5)
Chloride: 99 mEq/L (ref 96–112)
Creatinine, Ser: 0.75 mg/dL (ref 0.40–1.20)
GFR: 98.5 mL/min (ref >60.00)
Glucose, Bld: 82 mg/dL (ref 70–99)
Potassium: 3.9 mEq/L (ref 3.5–5.1)
Sodium: 136 mEq/L (ref 135–145)
Total Bilirubin: 0.7 mg/dL (ref 0.2–1.2)
Total Protein: 7.5 g/dL (ref 6.0–8.3)

## 2022-01-16 MED ORDER — FLUTICASONE-SALMETEROL 230-21 MCG/ACT IN AERO: 2.0000 | INHALATION_SPRAY | Freq: Two times a day (BID) | RESPIRATORY_TRACT | 12 refills | Status: DC

## 2022-01-16 NOTE — Progress Notes (Signed)
Chief Complaint  Patient presents with   Annual Exam     Well Woman Marie Rios is here for a complete physical.   Her last physical was >1 year ago.  Current diet: in general, an "OK" diet. Current exercise: limited 2/2 knee injury. Weight is intentionally losing and she denies fatigue out of ordinary. No LMP recorded. Patient has had a hysterectomy. Seatbelt? Yes Advanced directive? No  Health Maintenance Mammogram- Yes Tetanus- Received today.  Hep C screening- Yes HIV screening- Yes  Past Medical History:  Diagnosis Date   Anxiety    Asthma    Breast cyst, left 2019   Depression    Fibromyalgia    History of stomach ulcers    Lumbar herniated disc    Migraine    Seizures ([place])      Past Surgical History:  Procedure Laterality Date   APPENDECTOMY  1996   CHOLECYSTECTOMY  2003   LAPAROSCOPIC OOPHERECTOMY Right 2006   LAPAROSCOPIC OOPHERECTOMY Left 8099   pevlic surgery  8338   Plevic mass   TUBAL LIGATION  2003   VAGINAL HYSTERECTOMY  2005    Medications  Current Outpatient Medications on File Prior to Visit  Medication Sig Dispense Refill   Adalimumab (HUMIRA) 40 MG/0.8ML PSKT Inject 0.8 mLs (40 mg total) into the skin once a week. 2 each    albuterol (PROVENTIL) (2.5 MG/3ML) 0.083% nebulizer solution Use every 4 hours while awake for 5 days. Then use every 6 hours as needed. 150 mL 1   albuterol (VENTOLIN HFA) 108 (90 Base) MCG/ACT inhaler Inhale 2 puffs into the lungs every 6 (six) hours as needed for wheezing or shortness of breath. 8 g 2   cholestyramine (QUESTRAN) 4 g packet Take 1 packet (4 g total) by mouth 3 (three) times daily with meals. 60 each 12   dicyclomine (BENTYL) 10 MG capsule Take 1 tab every 6 hours as needed for abdominal cramping. 60 capsule 0   estrogens-methylTEST (ESTRATEST) 1.25-2.5 MG tablet Take 1 tablet by mouth daily. 90 tablet 4   fluticasone-salmeterol (ADVAIR HFA) 115-21 MCG/ACT inhaler Inhale 2 puffs into the lungs 2  (two) times daily. 1 each 1   Galcanezumab-gnlm (EMGALITY) 120 MG/ML SOAJ INJECT 120 MG INTO THE SKIN EVERY 30 (THIRTY) DAYS. 3 mL 3   ipratropium (ATROVENT) 0.03 % nasal spray Place 2 sprays into both nostrils daily.     methylPREDNISolone (MEDROL DOSEPAK) 4 MG TBPK tablet Take 6 tablets on day 1 and decrease by 1 tablet each day until finished 21 tablet 0   naratriptan (AMERGE) 2.5 MG tablet Take 1 tablet (2.5 mg total) by mouth as needed for migraine. Take one (1) tablet at onset of headache; if returns or does not resolve, may repeat after 4 hours; do not exceed five (5) mg in 24 hours. 10 tablet 0   ondansetron (ZOFRAN-ODT) 4 MG disintegrating tablet Take 1 tablet (4 mg total) by mouth every 8 (eight) hours as needed for nausea or vomiting. 20 tablet 11   rizatriptan (MAXALT) 10 MG tablet Take 1 tablet (10 mg total) by mouth as needed for migraine. May repeat in 2 hours if needed. Max 2 doses in 24 hours. Appointment needed. 10 tablet 2   spironolactone (ALDACTONE) 50 MG tablet Take 1 tablet (50 mg total) by mouth daily.     Allergies Allergies  Allergen Reactions   Gabapentin     Intolerant.    Keflex [Cephalexin]     Reflux  Latex Other (See Comments)    Rash   Ciprofloxacin Rash    Review of Systems: Constitutional:  no unexpected weight changes Eye:  no recent significant change in vision Ear/Nose/Mouth/Throat:  Ears:  no recent change in hearing Nose/Mouth/Throat:  no complaints of nasal congestion, no sore throat Cardiovascular: no chest pain Respiratory:  no shortness of breath Gastrointestinal:  no abdominal pain, no change in bowel habits GU:  Female: negative for dysuria or pelvic pain Musculoskeletal/Extremities:  +hand cramping and R knee pain; otherwise no pain of the joints Integumentary (Skin/Breast):  no abnormal skin lesions reported Neurologic:  no headaches Endocrine:  denies fatigue Hematologic/Lymphatic:  No areas of easy bleeding  Exam BP 108/68 (BP  Location: Left Arm, Patient Position: Sitting, Cuff Size: Normal)   Pulse 88   Temp 97.9 F (36.6 C) (Oral)   Ht '5\' 4"'$  (1.626 m)   Wt 217 lb (98.4 kg)   SpO2 98%   BMI 37.25 kg/m  General:  well developed, well nourished, in no apparent distress Skin:  no significant moles, warts, or growths Head:  no masses, lesions, or tenderness Eyes:  pupils equal and round, sclera anicteric without injection Ears:  canals without lesions, TMs shiny without retraction, no obvious effusion, no erythema Nose:  nares patent, mucosa normal, and no drainage Throat/Pharynx:  lips and gingiva without lesion; tongue and uvula midline; non-inflamed pharynx; no exudates or postnasal drainage Neck: neck supple without adenopathy, thyromegaly, or masses Lungs:  clear to auscultation, breath sounds equal bilaterally, no respiratory distress Cardio:  regular rate and rhythm, no LE edema Abdomen:  abdomen soft, nontender; bowel sounds normal; no masses or organomegaly Genital: Defer to GYN Musculoskeletal:  symmetrical muscle groups noted without atrophy or deformity Extremities:  no clubbing, cyanosis, or edema, no deformities, no skin discoloration Neuro:  gait normal; deep tendon reflexes normal and symmetric Psych: well oriented with normal range of affect and appropriate judgment/insight  Assessment and Plan  Well adult exam - Plan: CBC, Comprehensive metabolic panel, Lipid panel   Well 41 y.o. female. Counseled on diet and exercise. Other orders as above. She is working w the ortho team to help her knee. She has some cramping; could be from Aldactone from derm who she sees next week. Will ck lytes. Stay hydrated. Consider a spoonful of pickle juice nightly.  Advanced directive form provided today.  Tdap today. Politely declined the flu shot.  Follow up 1 yr. The patient voiced understanding and agreement to the plan.  [place], DO 01/16/22 11:55 AM

## 2022-01-16 NOTE — Addendum Note (Signed)
Addended by: Sharon Seller B on: 01/16/2022 12:41 PM   Modules accepted: Orders

## 2022-01-16 NOTE — Patient Instructions (Signed)
Give [place] 2-3 business days to get the results of your labs back.   Keep the diet clean and stay active.  Advanced directive form provided today.   For the muscle cramping, drink lots of fluids. Also take a spoonful of pickle juice nightly. An alternative would be a teaspoon of mustard, but most people prefer pickle juice.   Let us know if you need anything.

## 2022-01-17 ENCOUNTER — Encounter: Payer: Self-pay | Admitting: Family Medicine

## 2022-01-30 HISTORY — PX: KNEE ARTHROCENTESIS: SUR44

## 2022-02-28 ENCOUNTER — Encounter: Payer: Self-pay | Admitting: Family Medicine

## 2022-02-28 ENCOUNTER — Other Ambulatory Visit: Payer: Self-pay | Admitting: Family Medicine

## 2022-02-28 MED ORDER — MONTELUKAST SODIUM 10 MG PO TABS: 10.0000 mg | ORAL_TABLET | Freq: Every day | ORAL | 3 refills | Status: DC

## 2022-04-27 ENCOUNTER — Encounter: Payer: Self-pay | Admitting: Gastroenterology

## 2022-06-21 ENCOUNTER — Ambulatory Visit: Payer: 59 | Admitting: Gastroenterology

## 2022-06-21 ENCOUNTER — Encounter: Payer: Self-pay | Admitting: Gastroenterology

## 2022-06-21 VITALS — BP 122/80 | HR 80 | Ht 64.0 in | Wt 222.0 lb

## 2022-06-21 DIAGNOSIS — R194 Change in bowel habit: Secondary | ICD-10-CM | POA: Diagnosis not present

## 2022-06-21 DIAGNOSIS — K582 Mixed irritable bowel syndrome: Secondary | ICD-10-CM

## 2022-06-21 DIAGNOSIS — K649 Unspecified hemorrhoids: Secondary | ICD-10-CM

## 2022-06-21 DIAGNOSIS — R1084 Generalized abdominal pain: Secondary | ICD-10-CM

## 2022-06-21 MED ORDER — HYDROCORTISONE (PERIANAL) 2.5 % EX CREA: 1.0000 | TOPICAL_CREAM | Freq: Two times a day (BID) | CUTANEOUS | 1 refills | Status: AC | PRN

## 2022-06-21 MED ORDER — DICYCLOMINE HCL 10 MG PO CAPS: ORAL_CAPSULE | ORAL | 0 refills | Status: DC

## 2022-06-21 NOTE — Patient Instructions (Signed)
Your provider has requested that you go to the basement level for lab work before leaving today. Press "B" on the elevator. The lab is located at the first door on the left as you exit the elevator.  We have sent the following medications to your pharmacy for you to pick up at your convenience: Bentyl 10 mg every 6-8 hours as needed.  Hydrocortisone cream 2.5 % apply rectally twice daily as needed.   Start Benefiber 2 teaspoons in 8 ounces of liquid daily.  You have been scheduled for a colonoscopy. Please follow written instructions given to you at your visit today.  Please pick up your prep supplies at the pharmacy within the next 1-3 days. If you use inhalers (even only as needed), please bring them with you on the day of your procedure.  _______________________________________________________  If your blood pressure at your visit was 140/90 or greater, please contact your primary care physician to follow up on this.  _______________________________________________________  If you are age 2 or older, your body mass index should be between 23-30. Your Body mass index is 38.11 kg/m. If this is out of the aforementioned range listed, please consider follow up with your Primary Care Provider.  If you are age 61 or younger, your body mass index should be between 19-25. Your Body mass index is 38.11 kg/m. If this is out of the aformentioned range listed, please consider follow up with your Primary Care Provider.   ________________________________________________________  The [facility] providers would like to encourage you to use [facility] to communicate with providers for non-urgent requests or questions.  Due to long hold times on the telephone, sending your provider a message by [facility] may be a faster and more efficient way to get a response.  Please allow 48 business hours for a response.  Please remember that this is for non-urgent requests.   _______________________________________________________

## 2022-06-21 NOTE — Progress Notes (Signed)
06/21/2022 RYKER PROVENZANO [id number] 1980-12-31   HISTORY OF PRESENT ILLNESS:  This is a 42 year old female who is a patient of Dr. Milas Hock, seen by him once in 06/2021.  Was supposed to return for follow-up after that in 8-12 weeks.  Just returning now.  Has alternating bowel habits and generalized abdominal pain.  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  Reports a colonoscopy 8 years ago in [place], [place] with Dr. Allena Katz.  We do not have those records (they did have her sign for records previously).  She reports history of polyps.  She had been given Questran, but she says that she did not like it and even taking it just once a day it seemed to block her up too much.  She mostly has complaints of diarrhea, sometimes constipation.  She has used Bentyl in the past with some improvement in symptoms and is asking for prescription renewal.  She found the FODMAP diet very difficult to follow.  Did not try fiber supplement.  She is also complaining of a lot of hemorrhoid irritation from wiping so much, etc.  She had what sounds like Proctofoam hydrocortisone cream that was given to her and says that it helped to a degree while using it, but she is out of that.  Past Medical History:  Diagnosis Date   Anxiety    Asthma    Breast cyst, left 2019   Depression    Fibromyalgia    History of stomach ulcers    Lumbar herniated disc    Migraine    Seizures (HCC)    Past Surgical History:  Procedure Laterality Date   APPENDECTOMY  1996   CHOLECYSTECTOMY  2003   KNEE ARTHROCENTESIS  01/2022   LAPAROSCOPIC OOPHERECTOMY Right 2006   LAPAROSCOPIC OOPHERECTOMY Left 2007   pevlic surgery  2006   Plevic mass   TUBAL LIGATION  2003   VAGINAL HYSTERECTOMY  2005    reports that she quit smoking about 6 years ago. Her smoking use included cigarettes. She smoked an average of .5 packs per day. She has never used smokeless tobacco. She reports that she does not currently use alcohol. She reports that she does  not currently use drugs. family history includes Anemia in an other family member; Cancer in an other family member; Diabetes in an other family member; Fibromyalgia in an other family member; Heart attack in her father; Heart disease in an other family member; Stomach cancer in her paternal great-grandmother; Stroke in an other family member. Allergies  Allergen Reactions   Gabapentin     Intolerant.    Keflex [Cephalexin]     Reflux   Latex Other (See Comments)    Rash   Ciprofloxacin Rash      Outpatient Encounter Medications as of 06/21/2022  Medication Sig   Adalimumab (HUMIRA) 40 MG/0.8ML PSKT Inject 0.8 mLs (40 mg total) into the skin once a week.   albuterol (PROVENTIL) (2.5 MG/3ML) 0.083% nebulizer solution Use every 4 hours while awake for 5 days. Then use every 6 hours as needed.   albuterol (VENTOLIN HFA) 108 (90 Base) MCG/ACT inhaler Inhale 2 puffs into the lungs every 6 (six) hours as needed for wheezing or shortness of breath.   cholestyramine (QUESTRAN) 4 g packet Take 1 packet (4 g total) by mouth 3 (three) times daily with meals.   dicyclomine (BENTYL) 10 MG capsule Take 1 tab every 6 hours as needed for abdominal cramping.   estrogens-methylTEST (ESTRATEST) 1.25-2.5  MG tablet Take 1 tablet by mouth daily.   fluticasone-salmeterol (ADVAIR HFA) 230-21 MCG/ACT inhaler Inhale 2 puffs into the lungs 2 (two) times daily.   Galcanezumab-gnlm (EMGALITY) 120 MG/ML SOAJ INJECT 120 MG INTO THE SKIN EVERY 30 (THIRTY) DAYS.   ipratropium (ATROVENT) 0.03 % nasal spray Place 2 sprays into both nostrils daily.   montelukast (SINGULAIR) 10 MG tablet Take 1 tablet (10 mg total) by mouth at bedtime.   naratriptan (AMERGE) 2.5 MG tablet Take 1 tablet (2.5 mg total) by mouth as needed for migraine. Take one (1) tablet at onset of headache; if returns or does not resolve, may repeat after 4 hours; do not exceed five (5) mg in 24 hours.   ondansetron (ZOFRAN-ODT) 4 MG disintegrating tablet Take  1 tablet (4 mg total) by mouth every 8 (eight) hours as needed for nausea or vomiting.   rizatriptan (MAXALT) 10 MG tablet Take 1 tablet (10 mg total) by mouth as needed for migraine. May repeat in 2 hours if needed. Max 2 doses in 24 hours. Appointment needed.   spironolactone (ALDACTONE) 50 MG tablet Take 1 tablet (50 mg total) by mouth daily.   No facility-administered encounter medications on file as of 06/21/2022.     REVIEW OF SYSTEMS  : All other systems reviewed and negative except where noted in the History of Present Illness.   PHYSICAL EXAM: BP 122/80   Pulse 80   Ht 5\' 4"  (1.626 m)   Wt 222 lb (100.7 kg)   BMI 38.11 kg/m  General: Well developed female in no acute distress Head: Normocephalic and atraumatic Eyes:  Sclerae anicteric, conjunctiva pink. Ears: Normal auditory acuity Lungs: Clear throughout to auscultation; no W/R/R. Heart: Regular rate and rhythm; no M/R/G. Abdomen: Soft, non-distended.  BS present.  Mild diffuse TTP. Rectal: Will be done at the time of colonoscopy. Musculoskeletal: Symmetrical with no gross deformities  Skin: No lesions on visible extremities Extremities: No edema  Neurological: Alert oriented x 4, grossly non-focal Psychological:  Alert and cooperative. 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  Reports a colonoscopy 8 years ago in [place], [place] with Dr. Allena Katz.  We do not have those records (they did have her sign for records previously).  We will attempt again to obtain those.  She reports history of polyps.  With her symptoms and potential history of polyps and being unable to obtain her records at this point we will go ahead and schedule colonoscopy with Dr. Tomasa Rand.  She has used Bentyl in the past with some improvement in symptoms so I will renew that prescription for her at her request.  Will check celiac labs.  She found the FODMAP diet very  difficult to follow so she will at least try with lactose-free for few weeks.  She was given literature on this.  She did not like the taste of the cholestyramine and says that it blocked her up too much even just once daily.  Will try Benefiber powder 2 teaspoons mixed in 8 ounces of liquid daily to see if that will help regulate her bowel habits.  Diarrhea at least in part due to bile salt from postcholecystectomy state. *Hemorrhoids: Will prescribe hydrocortisone cream to use.  Prescription sent to pharmacy.  Hopefully will have improvement in these if we are able to get her bowel habits regulated.   CC:  Sharlene Dory*

## 2022-06-22 NOTE — Progress Notes (Signed)
Agree with the assessment and plan as outlined by Doug Sou, PA-C.  Can also consider Colestid for bile acid diarrhea if cholestyramine not well tolerated.  Ormand Senn E. Tomasa Rand, MD  [facility]

## 2022-06-29 ENCOUNTER — Ambulatory Visit (AMBULATORY_SURGERY_CENTER): Payer: 59 | Admitting: Gastroenterology

## 2022-06-29 ENCOUNTER — Encounter: Payer: Self-pay | Admitting: Gastroenterology

## 2022-06-29 VITALS — BP 95/54 | HR 67 | Temp 97.0°F | Resp 15 | Ht 64.0 in | Wt 222.0 lb

## 2022-06-29 DIAGNOSIS — Z860100 Personal history of colon polyps, unspecified: Secondary | ICD-10-CM

## 2022-06-29 DIAGNOSIS — Z09 Encounter for follow-up examination after completed treatment for conditions other than malignant neoplasm: Secondary | ICD-10-CM

## 2022-06-29 DIAGNOSIS — R1084 Generalized abdominal pain: Secondary | ICD-10-CM

## 2022-06-29 DIAGNOSIS — R194 Change in bowel habit: Secondary | ICD-10-CM

## 2022-06-29 MED ORDER — SODIUM CHLORIDE 0.9 % IV SOLN
500.0000 mL | Freq: Once | INTRAVENOUS | Status: DC
Start: 2022-06-29 — End: 2022-11-16

## 2022-06-29 NOTE — Progress Notes (Signed)
History and Physical Interval Note:  06/29/2022 11:50 AM  Marie Rios  has presented today for endoscopic procedure(s), with the diagnosis of  Encounter Diagnoses  Name Primary?   Change in bowel habits Yes   Generalized abdominal pain   .  The various methods of evaluation and treatment have been discussed with the patient and/or family. After consideration of risks, benefits and other options for treatment, the patient has consented to  the endoscopic procedure(s).   The patient's history has been reviewed, patient examined, no change in status, stable for endoscopic procedure(s).  I have reviewed the patient's chart and labs.  Questions were answered to the patient's satisfaction.     Alonso Gapinski E. Tomasa Rand, MD [facility]

## 2022-06-29 NOTE — Patient Instructions (Signed)
YOU HAD AN ENDOSCOPIC PROCEDURE TODAY AT THE [facility]:   Refer to the procedure report that was given to you for any specific questions about what was found during the examination.  If the procedure report does not answer your questions, please call your gastroenterologist to clarify.  If you requested that your care partner not be given the details of your procedure findings, then the procedure report has been included in a sealed envelope for you to review at your convenience later.  YOU SHOULD EXPECT: Some feelings of bloating in the abdomen. Passage of more gas than usual.  Walking can help get rid of the air that was put into your GI tract during the procedure and reduce the bloating. If you had a lower endoscopy (such as a colonoscopy or flexible sigmoidoscopy) you may notice spotting of blood in your stool or on the toilet paper. If you underwent a bowel prep for your procedure, you may not have a normal bowel movement for a few days.  Please Note:  You might notice some irritation and congestion in your nose or some drainage.  This is from the oxygen used during your procedure.  There is no need for concern and it should clear up in a day or so.  SYMPTOMS TO REPORT IMMEDIATELY:  Following lower endoscopy (colonoscopy or flexible sigmoidoscopy):  Excessive amounts of blood in the stool  Significant tenderness or worsening of abdominal pains  Swelling of the abdomen that is new, acute  Fever of 100F or higher  For urgent or emergent issues, a gastroenterologist can be reached at any hour by calling [phone]. Do not use MyChart messaging for urgent concerns.    DIET:  We do recommend a small meal at first, but then you may proceed to your regular diet.  Drink plenty of fluids but you should avoid alcoholic beverages for 24 hours.  ACTIVITY:  You should plan to take it easy for the rest of today and you should NOT DRIVE or use heavy machinery until tomorrow (because of  the sedation medicines used during the test).    FOLLOW UP: Our staff will call the number listed on your records the next business day following your procedure.  We will call around 7:15- 8:00 am to check on you and address any questions or concerns that you may have regarding the information given to you following your procedure. If we do not reach you, we will leave a message.     If any biopsies were taken you will be contacted by phone or by letter within the next 1-3 weeks.  Please call us at [phone] if you have not heard about the biopsies in 3 weeks.    SIGNATURES/CONFIDENTIALITY: You and/or your care partner have signed paperwork which will be entered into your electronic medical record.  These signatures attest to the fact that that the information above on your After Visit Summary has been reviewed and is understood.  Full responsibility of the confidentiality of this discharge information lies with you and/or your care-partner.  

## 2022-06-29 NOTE — Progress Notes (Signed)
Pt's states no medical or surgical changes since previsit or office visit. 

## 2022-06-29 NOTE — Op Note (Signed)
[facility] Patient Name: Marie Rios Procedure Date: 06/29/2022 11:53 AM MRN: [id number] Endoscopist: Lorin Picket E. Tomasa Rand , MD, [id number] Age: 42 Referring MD:  Date of Birth: September 09, 1980 Gender: Female Account #: [id number] Procedure:                Colonoscopy Indications:              Surveillance: Personal history of colonic polyps                            (unknown histology) on last colonoscopy more than 5                            years ago Medicines:                Monitored Anesthesia Care Procedure:                Pre-Anesthesia Assessment:                           - Prior to the procedure, a History and Physical                            was performed, and patient medications and                            allergies were reviewed. The patient's tolerance of                            previous anesthesia was also reviewed. The risks                            and benefits of the procedure and the sedation                            options and risks were discussed with the patient.                            All questions were answered, and informed consent                            was obtained. Prior Anticoagulants: The patient has                            taken no anticoagulant or antiplatelet agents. ASA                            Grade Assessment: II - A patient with mild systemic                            disease. After reviewing the risks and benefits,                            the patient was deemed in satisfactory condition to  undergo the procedure.                           After obtaining informed consent, the colonoscope                            was passed under direct vision. Throughout the                            procedure, the patient's blood pressure, pulse, and                            oxygen saturations were monitored continuously. The                            Olympus PCF-H190DL (#[id number])  Colonoscope was                            introduced through the anus and advanced to the the                            terminal ileum, with identification of the                            appendiceal orifice and IC valve. The colonoscopy                            was performed without difficulty. The patient                            tolerated the procedure well. The quality of the                            bowel preparation was adequate. The terminal ileum,                            ileocecal valve, appendiceal orifice, and rectum                            were photographed. The bowel preparation used was                            Miralax via split dose instruction. Scope In: 11:59:29 AM Scope Out: 12:09:32 PM Scope Withdrawal Time: 0 hours 8 minutes 3 seconds  Total Procedure Duration: 0 hours 10 minutes 3 seconds  Findings:                 The perianal and digital rectal examinations were                            normal. Pertinent negatives include normal                            sphincter tone and no palpable rectal lesions.  The colon (entire examined portion) appeared normal.                           The terminal ileum appeared normal.                           The retroflexed view of the distal rectum and anal                            verge was normal and showed no anal or rectal                            abnormalities. Complications:            No immediate complications. Estimated Blood Loss:     Estimated blood loss was minimal. Impression:               - The entire examined colon is normal.                           - The examined portion of the ileum was normal.                           - The distal rectum and anal verge are normal on                            retroflexion view.                           - No specimens collected. Recommendation:           - Patient has a contact number available for                             emergencies. The signs and symptoms of potential                            delayed complications were discussed with the                            patient. Return to normal activities tomorrow.                            Written discharge instructions were provided to the                            patient.                           - Resume previous diet.                           - Continue present medications.                           - Await pathology results.                           -  Repeat colonoscopy in 10 years for screening                            purposes.                           - Follow up as needed in clinic for ongoing                            management of IBS symptoms Jacqualine Weichel E. Tomasa Rand, MD 06/29/2022 12:14:16 PM This report has been signed electronically.

## 2022-06-29 NOTE — Progress Notes (Signed)
A and O x3. Report to RN. Tolerated MAC anesthesia well. 

## 2022-06-30 ENCOUNTER — Telehealth: Payer: Self-pay

## 2022-06-30 NOTE — Telephone Encounter (Signed)
  Follow up Call-     06/29/2022   10:45 AM  Call back number  Post procedure Call Back phone  # [phone]  Permission to leave phone message Yes     Patient questions:  Do you have a fever, pain , or abdominal swelling? No. Pain Score  0 *  Have you tolerated food without any problems? Yes.    Have you been able to return to your normal activities? Yes.    Do you have any questions about your discharge instructions: Diet   No. Medications  No. Follow up visit  No.  Do you have questions or concerns about your Care? No.  Actions: * If pain score is 4 or above: No action needed, pain <4.

## 2022-07-04 ENCOUNTER — Other Ambulatory Visit: Payer: Self-pay | Admitting: Family Medicine

## 2022-07-18 ENCOUNTER — Telehealth: Payer: 59 | Admitting: Family Medicine

## 2022-07-24 ENCOUNTER — Telehealth (INDEPENDENT_AMBULATORY_CARE_PROVIDER_SITE_OTHER): Payer: 59 | Admitting: Family Medicine

## 2022-07-24 ENCOUNTER — Encounter: Payer: Self-pay | Admitting: Family Medicine

## 2022-07-24 DIAGNOSIS — J4540 Moderate persistent asthma, uncomplicated: Secondary | ICD-10-CM | POA: Insufficient documentation

## 2022-07-24 DIAGNOSIS — R053 Chronic cough: Secondary | ICD-10-CM | POA: Diagnosis not present

## 2022-07-24 MED ORDER — FLUTICASONE PROPIONATE 50 MCG/ACT NA SUSP: 2.0000 | Freq: Every day | NASAL | 6 refills | Status: AC

## 2022-07-24 MED ORDER — SPIRONOLACTONE 25 MG PO TABS: 75.0000 mg | ORAL_TABLET | Freq: Every day | ORAL | 3 refills | Status: AC

## 2022-07-24 NOTE — Progress Notes (Signed)
Chief Complaint  Patient presents with   Follow-up    Subjective: Patient is a 42 y.o. female here for f/u. We are interacting via web portal for an electronic face-to-face visit. I verified patient's ID using 2 identifiers. Patient agreed to proceed with visit via this method. Patient is at home, I am at office. Patient and I are present for visit.   Taking Advair 230-21 mcg 2 puffs bid. Compliant, no AE's. Using SABA 1x/2 weeks.  She is also compliant with Singulair 10 mg daily.  No wheezing.  Chronic cough-going on for around 6 months.  She feels drainage in the back of her throat but has been taking Allegra and Singulair daily.  She uses her Flonase as needed.  It gets worse when she talks for periods of time.  Gets better when she drinks water.  No association with meals or history of reflux.  No unintentional weight loss.  Past Medical History:  Diagnosis Date   Anxiety    Asthma    Breast cyst, left 2019   Depression    Fibromyalgia    History of stomach ulcers    Lumbar herniated disc    Migraine    Seizures (HCC)     Objective: No conversational dyspnea Age appropriate judgment and insight Nml affect and mood  Assessment and Plan: Chronic cough - Plan: Ambulatory referral to ENT  Moderate persistent asthma without complication  Chronic, uncontrolled.  Refer to ENT.  While she is waiting on her referral, she will continue Singulair and Allegra, she will go back to Flonase daily to help rule out upper airway cough syndrome.  If that is unremarkable workup, would assume they would put her on a PPI.  After 1 to 2 months of this, will refer to pulmonology if still not better.  Given relatively adequate control of her asthma, I doubt this is contributing.  Chronic, stable.  Continue Advair 231-21 mcg 2 puffs twice daily, singular 10 mg daily, albuterol as needed. Tentatively I will see her in 6 months. The patient voiced understanding and agreement to the plan.  [name] [place], DO 07/24/22  4:45 PM

## 2022-08-30 ENCOUNTER — Telehealth: Payer: Self-pay | Admitting: Family Medicine

## 2022-08-30 NOTE — Telephone Encounter (Signed)
Pt called wanting to know if her August appt can be changed to a Mychart appt. Pt states there are no changes and that everything is good so far. Pt stated that if no labs will be needed on this appt she would like to do it as a Mychart. Please advise.

## 2022-08-30 NOTE — Telephone Encounter (Signed)
Yes I thinks it fine, we can access her cpap data online.

## 2022-08-30 NOTE — Telephone Encounter (Signed)
Have changed pt's appt to a MyChart Video Visit as requested.

## 2022-09-05 NOTE — Patient Instructions (Incomplete)
Please continue using your CPAP regularly. While your insurance requires that you use CPAP at least 4 hours each night on 70% of the nights, I recommend, that you not skip any nights and use it throughout the night if you can. Getting used to CPAP and staying with the treatment long term does take time and patience and discipline. Untreated obstructive sleep apnea when it is moderate to severe can have an adverse impact on cardiovascular health and raise her risk for heart disease, arrhythmias, hypertension, congestive heart failure, stroke and diabetes. Untreated obstructive sleep apnea causes sleep disruption, nonrestorative sleep, and sleep deprivation. This can have an impact on your day to day functioning and cause daytime sleepiness and impairment of cognitive function, memory loss, mood disturbance, and problems focussing. Using CPAP regularly can improve these symptoms.  We will update supply orders, today. Continue Emgality every 30 days and rizatriptan as needed. May use Amerge and or ondansetron for intractable headaches.   Follow up in 1 year   GENERAL HEADACHE INFORMATION:   Natural supplements: Magnesium Oxide or Magnesium Glycinate 500 mg at bed (up to 800 mg daily) Coenzyme Q10 300 mg in AM Vitamin B2- 200 mg twice a day   Add 1 supplement at a time since even natural supplements can have undesirable side effects. You can sometimes buy supplements cheaper (especially Coenzyme Q10) at www.WebmailGuide.co.za or at [facility].  Migraine with aura: There is increased risk for stroke in women with migraine with aura and a contraindication for the combined contraceptive pill for use by women who have migraine with aura. The risk for women with migraine without aura is lower. However other risk factors like smoking are far more likely to increase stroke risk than migraine. There is a recommendation for no smoking and for the use of OCPs without estrogen such as progestogen only pills particularly for  women with migraine with aura.Marland Kitchen People who have migraine headaches with auras may be 3 times more likely to have a stroke caused by a blood clot, compared to migraine patients who don't see auras. Women who take hormone-replacement therapy may be 30 percent more likely to suffer a clot-based stroke than women not taking medication containing estrogen. Other risk factors like smoking and high blood pressure may be  much more important.    Vitamins and herbs that show potential:   Magnesium: Magnesium (250 mg twice a day or 500 mg at bed) has a relaxant effect on smooth muscles such as blood vessels. Individuals suffering from frequent or daily headache usually have low magnesium levels which can be increase with daily supplementation of 400-750 mg. Three trials found 40-90% average headache reduction  when used as a preventative. Magnesium may help with headaches are aura, the best evidence for magnesium is for migraine with aura is its thought to stop the cortical spreading depression we believe is the pathophysiology of migraine aura.Magnesium also demonstrated the benefit in menstrually related migraine.  Magnesium is part of the messenger system in the serotonin cascade and it is a good muscle relaxant.  It is also useful for constipation which can be a side effect of other medications used to treat migraine. Good sources include nuts, whole grains, and tomatoes. Side Effects: loose stool/diarrhea  Riboflavin (vitamin B 2) 200 mg twice a day. This vitamin assists nerve cells in the production of ATP a principal energy storing molecule.  It is necessary for many chemical reactions in the body.  There have been at least 3 clinical trials  of riboflavin using 400 mg per day all of which suggested that migraine frequency can be decreased.  All 3 trials showed significant improvement in over half of migraine sufferers.  The supplement is found in bread, cereal, milk, meat, and poultry.  Most Americans get more  riboflavin than the recommended daily allowance, however riboflavin deficiency is not necessary for the supplements to help prevent headache. Side effects: energizing, green urine   Coenzyme Q10: This is present in almost all cells in the body and is critical component for the conversion of energy.  Recent studies have shown that a nutritional supplement of CoQ10 can reduce the frequency of migraine attacks by improving the energy production of cells as with riboflavin.  Doses of 150 mg twice a day have been shown to be effective.   Melatonin: Increasing evidence shows correlation between melatonin secretion and headache conditions.  Melatonin supplementation has decreased headache intensity and duration.  It is widely used as a sleep aid.  Sleep is natures way of dealing with migraine.  A dose of 3 mg is recommended to start for headaches including cluster headache. Higher doses up to 15 mg has been reviewed for use in Cluster headache and have been used. The rationale behind using melatonin for cluster is that many theories regarding the cause of Cluster headache center around the disruption of the normal circadian rhythm in the brain.  This helps restore the normal circadian rhythm.   HEADACHE DIET: Foods and beverages which may trigger migraine Note that only 20% of headache patients are food sensitive. You will know if you are food sensitive if you get a headache consistently 20 minutes to 2 hours after eating a certain food. Only cut out a food if it causes headaches, otherwise you might remove foods you enjoy! What matters most for diet is to eat a well balanced healthy diet full of vegetables and low fat protein, and to not miss meals.   Chocolate, other sweets ALL cheeses except cottage and cream cheese Dairy products, yogurt, sour cream, ice cream Liver Meat extracts (Bovril, Marmite, meat tenderizers) Meats or fish which have undergone aging, fermenting, pickling or smoking. These include:  Hotdogs,salami,Lox,sausage, mortadellas,smoked salmon, pepperoni, Pickled herring Pods of broad bean (English beans, Chinese pea pods, [place] (fava) beans, lima and navy beans Ripe avocado, ripe banana Yeast extracts or active yeast preparations such as Brewer's or Fleishman's (commercial bakes goods are permitted) Tomato based foods, pizza (lasagna, etc.)   MSG (monosodium glutamate) is disguised as many things; look for these common aliases: Monopotassium glutamate Autolysed yeast Hydrolysed protein Sodium caseinate "flavorings" "all natural preservatives" Nutrasweet   Avoid all other foods that convincingly provoke headaches.   Resources: The Dizzy [place] Laundry Your Headache Diet, migrainestrong.com  https://zamora-andrews.com/   Caffeine and Migraine For patients that have migraine, caffeine intake more than 3 days per week can lead to dependency and increased migraine frequency. I would recommend cutting back on your caffeine intake as best you can. The recommended amount of caffeine is 200-300 mg daily, although migraine patients may experience dependency at even lower doses. While you may notice an increase in headache temporarily, cutting back will be helpful for headaches in the long run. For more information on caffeine and migraine, visit: https://americanmigrainefoundation.org/resource-library/caffeine-and-migraine/   Headache Prevention Strategies:   1. Maintain a headache diary; learn to identify and avoid triggers.  - This can be a simple note where you log when you had a headache, associated symptoms, and medications used - There  are several smartphone apps developed to help track migraines: Migraine Buddy, Migraine Monitor, Curelator N1-Headache App   Common triggers include: Emotional triggers: Emotional/Upset family or friends Emotional/Upset occupation Business reversal/success Anticipation  anxiety Crisis-serious Post-crisis periodNew job/position   Physical triggers: Vacation Day Weekend Strenuous Exercise High Altitude Location New Move Menstrual Day Physical Illness Oversleep/Not enough sleep Weather changes Light: Photophobia or light sesnitivity treatment involves a balance between desensitization and reduction in overly strong input. Use dark polarized glasses outside, but not inside. Avoid bright or fluorescent light, but do not dim environment to the point that going into a normally lit room hurts. Consider FL-41 tint lenses, which reduce the most irritating wavelengths without blocking too much light.  These can be obtained at axonoptics.com or theraspecs.com Foods: see list above.   2. Limit use of acute treatments (over-the-counter medications, triptans, etc.) to no more than 2 days per week or 10 days per month to prevent medication overuse headache (rebound headache).     3. Follow a regular schedule (including weekends and holidays): Don't skip meals. Eat a balanced diet. 8 hours of sleep nightly. Minimize stress. Exercise 30 minutes per day. Being overweight is associated with a 5 times increased risk of chronic migraine. Keep well hydrated and drink 6-8 glasses of water per day.   4. Initiate non-pharmacologic measures at the earliest onset of your headache. Rest and quiet environment. Relax and reduce stress. Breathe2Relax is a free app that can instruct you on    some simple relaxtion and breathing techniques. Http://Dawnbuse.com is a    free website that provides teaching videos on relaxation.  Also, there are  many apps that   can be downloaded for "mindful" relaxation.  An app called YOGA NIDRA will help walk you through mindfulness. Another app called Calm can be downloaded to give you a structured mindfulness guide with daily reminders and skill development. Headspace for guided meditation Mindfulness Based Stress Reduction Online Course:  www.palousemindfulness.com Cold compresses.   5. Don't wait!! Take the maximum allowable dosage of prescribed medication at the first sign of migraine.   6. Compliance:  Take prescribed medication regularly as directed and at the first sign of a migraine.   7. Communicate:  Call your physician when problems arise, especially if your headaches change, increase in frequency/severity, or become associated with neurological symptoms (weakness, numbness, slurred speech, etc.). Proceed to emergency room if you experience new or worsening symptoms or symptoms do not resolve, if you have new neurologic symptoms or if headache is severe, or for any concerning symptom.   8. Headache/pain management therapies: Consider various complementary methods, including medication, behavioral therapy, psychological counselling, biofeedback, massage therapy, acupuncture, dry needling, and other modalities.  Such measures may reduce the need for medications. Counseling for pain management, where patients learn to function and ignore/minimize their pain, seems to work very well.   9. Recommend changing family's attention and focus away from patient's headaches. Instead, emphasize daily activities. If first question of day is 'How are your headaches/Do you have a headache today?', then patient will constantly think about headaches, thus making them worse. Goal is to re-direct attention away from headaches, toward daily activities and other distractions.   10. Helpful Websites: www.AmericanHeadacheSociety.org PatentHood.ch www.headaches.org TightMarket.nl www.achenet.org

## 2022-09-05 NOTE — Progress Notes (Unsigned)
PATIENT: Marie Rios DOB: [birth year]  REASON FOR VISIT: follow up HISTORY FROM: patient  Virtual Visit via Telephone Note  I connected with Marie Rios on 09/05/22 at  3:00 PM EDT by telephone and verified that I am speaking with the correct person using two identifiers.   I discussed the limitations, risks, security and privacy concerns of performing an evaluation and management service by telephone and the availability of in person appointments. I also discussed with the patient that there may be a patient responsible charge related to this service. The patient expressed understanding and agreed to proceed.   History of Present Illness:  09/05/22 ALL (Mychart): Marie Rios returns for follow up for migraines and OSA on CPAP. She continues Emgality and rizatriptan. Amerge? Ondansetron?     08/30/2021 ALL (Mychart):  Marie Rios is a 42 y.o. female here today for follow up for migraines and OSA on CPAP. She continues Emgality and rizatriptan. She has been doing very well. Maybe 1-2 headache days a month. Rizatriptan usually works well for abortive therapy. She reports having a intractable migraine lasting about a week. She took a medrol dose pack with OTC analgesics. Pain is better. She continues to have a dull lingering headache. She reports having some allergy symptoms a couple weeks ago but those have resolved. She also report spironolactone dose was increased to 75mg . She started this dose last week. She is having more constipation. She was advised to increase Citrucel and fiber in diet. She is drinking 7-8 glasses of water daily.   She continues CPAP. No concerns with machine or supplies.      09/20/2020 ALL: Marie Rios returns for follow up for migraines (Ahern) and OSA on CPAP (Athar). 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 She reports headaches are better on Emgality. She does continue to have about 15 headache days. About 7 migraine days. Migraines are usually easily  aborted with rizatriptan. She may take 4-5 times a month. She reports weight gain over the past year. She is using intermittent fasting with diet changes but not able to lose weight. Review of chart shows that she lost 25 pounds after being on Emgality for 1 year. She has gained about 45 pounds over the past year. She is now on estrogen replacement. She is hesitant to make a change as Emgality has worked so well.   Observations/Objective:  Generalized: Well developed, in no acute distress  Mentation: Alert oriented to time, place, history taking. Follows all commands speech and language fluent   Assessment and Plan:  42 y.o. year old female  has a past medical history of Anxiety, Asthma, Breast cyst, left (2019), Depression, Fibromyalgia, History of stomach ulcers, Lumbar herniated disc, Migraine, and Seizures (HCC). here with   No diagnosis found.   Marie Rios reports migraines are usually well managed and tolerating Emgality and rizatriptan. We will continue current plan. I will give her naratriptan to use for prolonged headache symptoms. Appropriate dosing discussed. She was encouraged to focus on getting at least 60-70 ounces of water every day. May adust prevention meds if headaches become more frequent. Healthy lifestyle habits encouraged. CPAP compliance is excellent. Follow up in 1 year.    No orders of the defined types were placed in this encounter.   No orders of the defined types were placed in this encounter.    Follow Up Instructions:  I discussed the assessment and treatment plan with the patient. The patient was provided an opportunity to ask questions and all were  answered. The patient agreed with the plan and demonstrated an understanding of the instructions.   The patient was advised to call back or seek an in-person evaluation if the symptoms worsen or if the condition fails to improve as anticipated.  I provided 15 minutes of non-face-to-face time during this  encounter. Patient located at their place of residence during Mychart visit. Provider is in the office.    Shawnie Dapper, NP

## 2022-09-06 ENCOUNTER — Other Ambulatory Visit: Payer: 59

## 2022-09-06 ENCOUNTER — Ambulatory Visit: Payer: 59 | Admitting: Family Medicine

## 2022-09-06 ENCOUNTER — Encounter: Payer: Self-pay | Admitting: Family Medicine

## 2022-09-06 VITALS — BP 111/73 | HR 75 | Ht 64.0 in | Wt 225.5 lb

## 2022-09-06 DIAGNOSIS — K582 Mixed irritable bowel syndrome: Secondary | ICD-10-CM

## 2022-09-06 DIAGNOSIS — R1084 Generalized abdominal pain: Secondary | ICD-10-CM

## 2022-09-06 DIAGNOSIS — K649 Unspecified hemorrhoids: Secondary | ICD-10-CM

## 2022-09-06 DIAGNOSIS — R194 Change in bowel habit: Secondary | ICD-10-CM

## 2022-09-06 DIAGNOSIS — G4733 Obstructive sleep apnea (adult) (pediatric): Secondary | ICD-10-CM | POA: Diagnosis not present

## 2022-09-06 DIAGNOSIS — G43009 Migraine without aura, not intractable, without status migrainosus: Secondary | ICD-10-CM

## 2022-09-06 MED ORDER — EMGALITY 120 MG/ML ~~LOC~~ SOAJ: SUBCUTANEOUS | 3 refills | Status: DC

## 2022-09-06 MED ORDER — RIZATRIPTAN BENZOATE 10 MG PO TABS: 10.0000 mg | ORAL_TABLET | ORAL | 2 refills | Status: DC | PRN

## 2022-09-15 NOTE — Telephone Encounter (Signed)
Marie Rios the pt received her celiac labs results and has questions. See her note

## 2022-10-27 ENCOUNTER — Other Ambulatory Visit: Payer: Self-pay | Admitting: Family Medicine

## 2022-11-16 ENCOUNTER — Encounter: Payer: Self-pay | Admitting: Family Medicine

## 2022-11-16 ENCOUNTER — Other Ambulatory Visit: Payer: Self-pay | Admitting: Family Medicine

## 2022-11-16 MED ORDER — PROMETHAZINE-DM 6.25-15 MG/5ML PO SYRP: 5.0000 mL | ORAL_SOLUTION | Freq: Four times a day (QID) | ORAL | 0 refills | Status: DC | PRN

## 2023-01-19 ENCOUNTER — Ambulatory Visit (INDEPENDENT_AMBULATORY_CARE_PROVIDER_SITE_OTHER): Payer: 59 | Admitting: Family Medicine

## 2023-01-19 ENCOUNTER — Encounter: Payer: Self-pay | Admitting: Family Medicine

## 2023-01-19 VITALS — BP 124/78 | HR 73 | Temp 98.0°F | Resp 16 | Ht 64.0 in | Wt 228.0 lb

## 2023-01-19 DIAGNOSIS — Z0000 Encounter for general adult medical examination without abnormal findings: Secondary | ICD-10-CM | POA: Diagnosis not present

## 2023-01-19 LAB — CBC
HCT: 38.5 % (ref 36.0–46.0)
Hemoglobin: 12.8 g/dL (ref 12.0–15.0)
MCHC: 33.3 g/dL (ref 30.0–36.0)
MCV: 91 fL (ref 78.0–100.0)
Platelets: 243 10*3/uL (ref 150.0–400.0)
RBC: 4.23 Mil/uL (ref 3.87–5.11)
RDW: 13.3 % (ref 11.5–15.5)
WBC: 7.9 10*3/uL (ref 4.0–10.5)

## 2023-01-19 LAB — COMPREHENSIVE METABOLIC PANEL
ALT: 27 U/L (ref 0–35)
AST: 30 U/L (ref 0–37)
Albumin: 4.3 g/dL (ref 3.5–5.2)
Alkaline Phosphatase: 39 U/L (ref 39–117)
BUN: 12 mg/dL (ref 6–23)
CO2: 27 meq/L (ref 19–32)
Calcium: 8.9 mg/dL (ref 8.4–10.5)
Chloride: 101 meq/L (ref 96–112)
Creatinine, Ser: 0.65 mg/dL (ref 0.40–1.20)
GFR: 108.16 mL/min (ref >60.00)
Glucose, Bld: 77 mg/dL (ref 70–99)
Potassium: 4.1 meq/L (ref 3.5–5.1)
Sodium: 137 meq/L (ref 135–145)
Total Bilirubin: 0.7 mg/dL (ref 0.2–1.2)
Total Protein: 7.6 g/dL (ref 6.0–8.3)

## 2023-01-19 LAB — LIPID PANEL
Cholesterol: 181 mg/dL (ref 0–200)
HDL: 52.1 mg/dL (ref >39.00)
LDL Cholesterol: 118 mg/dL — ABNORMAL HIGH (ref 0–99)
NonHDL: 128.42
Total CHOL/HDL Ratio: 3
Triglycerides: 52 mg/dL (ref 0.0–149.0)
VLDL: 10.4 mg/dL (ref 0.0–40.0)

## 2023-01-19 MED ORDER — PROMETHAZINE-DM 6.25-15 MG/5ML PO SYRP: 5.0000 mL | ORAL_SOLUTION | Freq: Four times a day (QID) | ORAL | 2 refills | Status: DC | PRN

## 2023-01-19 NOTE — Patient Instructions (Addendum)
Give us 2-3 business days to get the results of your labs back.   Keep the diet clean and stay active.  Please get me a copy of your advanced directive form at your convenience.   Let us know if you need anything.  

## 2023-01-19 NOTE — Progress Notes (Signed)
Chief Complaint  Patient presents with   Annual Exam    Annual Exam     Well Woman Marie Rios is here for a complete physical.   Her last physical was >1 year ago.  Current diet: in general, diet is OK.  Current exercise: walking, cardio, core, strength training. Weight is stable and she denies fatigue out of ordinary. No LMP recorded. Patient has had a hysterectomy. Seatbelt? Yes Advanced directive? No  Health Maintenance Mammogram- Yes Tetanus- Yes Hep C screening- Yes HIV screening- Yes  Past Medical History:  Diagnosis Date   Anxiety    Asthma    Breast cyst, left 2019   Depression    Fibromyalgia    History of stomach ulcers    Lumbar herniated disc    Migraine    Seizures (HCC)      Past Surgical History:  Procedure Laterality Date   APPENDECTOMY  1996   CERVICAL SPINE SURGERY  2020   CHOLECYSTECTOMY  2003   KNEE ARTHROCENTESIS  01/2022   LAPAROSCOPIC OOPHERECTOMY Right 2006   LAPAROSCOPIC OOPHERECTOMY Left 2007   pevlic surgery  2006   Plevic mass   TUBAL LIGATION  2003   VAGINAL HYSTERECTOMY  2005    Medications  Current Outpatient Medications on File Prior to Visit  Medication Sig Dispense Refill   Adalimumab (HUMIRA) 40 MG/0.8ML PSKT Inject 0.8 mLs (40 mg total) into the skin once a week. 2 each    albuterol (PROVENTIL) (2.5 MG/3ML) 0.083% nebulizer solution Use every 4 hours while awake for 5 days. Then use every 6 hours as needed. 150 mL 1   albuterol (VENTOLIN HFA) 108 (90 Base) MCG/ACT inhaler Inhale 2 puffs into the lungs every 6 (six) hours as needed for wheezing or shortness of breath. 8 g 2   cholestyramine (QUESTRAN) 4 g packet Take 1 packet (4 g total) by mouth 3 (three) times daily with meals. 60 each 12   dicyclomine (BENTYL) 10 MG capsule Take 1 tab every 6 hours as needed for abdominal cramping. 60 capsule 0   estrogens-methylTEST (ESTRATEST) 1.25-2.5 MG tablet Take 1 tablet by mouth daily. 90 tablet 4   fluticasone (FLONASE) 50  MCG/ACT nasal spray Place 2 sprays into both nostrils daily. 16 g 6   fluticasone-salmeterol (ADVAIR HFA) 230-21 MCG/ACT inhaler Inhale 2 puffs into the lungs 2 (two) times daily. 1 each 12   Galcanezumab-gnlm (EMGALITY) 120 MG/ML SOAJ INJECT 120 MG INTO THE SKIN EVERY 30 (THIRTY) DAYS. 3 mL 3   hydrocortisone (ANUSOL-HC) 2.5 % rectal cream Place 1 Application rectally 2 (two) times daily as needed for hemorrhoids or anal itching. 30 g 1   ipratropium (ATROVENT) 0.03 % nasal spray Place 2 sprays into both nostrils daily.     montelukast (SINGULAIR) 10 MG tablet Take 1 tablet (10 mg total) by mouth at bedtime. 90 tablet 1   naratriptan (AMERGE) 2.5 MG tablet Take 1 tablet (2.5 mg total) by mouth as needed for migraine. Take one (1) tablet at onset of headache; if returns or does not resolve, may repeat after 4 hours; do not exceed five (5) mg in 24 hours. 10 tablet 0   ondansetron (ZOFRAN-ODT) 4 MG disintegrating tablet Take 1 tablet (4 mg total) by mouth every 8 (eight) hours as needed for nausea or vomiting. 20 tablet 11   promethazine-dextromethorphan (PROMETHAZINE-DM) 6.25-15 MG/5ML syrup Take 5 mLs by mouth 4 (four) times daily as needed for cough. 118 mL 0   rizatriptan (MAXALT)  10 MG tablet Take 1 tablet (10 mg total) by mouth as needed for migraine. May repeat in 2 hours if needed. Max 2 doses in 24 hours. Appointment needed. 10 tablet 2   spironolactone (ALDACTONE) 25 MG tablet Take 3 tablets (75 mg total) by mouth daily. 270 tablet 3   Allergies Allergies  Allergen Reactions   Gabapentin     Intolerant.    Keflex [Cephalexin]     Reflux   Latex Other (See Comments)    Rash   Ciprofloxacin Rash    Review of Systems: Constitutional:  no unexpected weight changes Eye:  no recent significant change in vision Ear/Nose/Mouth/Throat:  Ears:  no recent change in hearing Nose/Mouth/Throat:  no complaints of nasal congestion, no sore throat Cardiovascular: no chest pain Respiratory:  no  shortness of breath Gastrointestinal:  no abdominal pain, no change in bowel habits GU:  Female: negative for dysuria or pelvic pain Musculoskeletal/Extremities:  no pain of the joints Integumentary (Skin/Breast):  no abnormal skin lesions reported Neurologic:  no headaches Endocrine:  denies fatigue Hematologic/Lymphatic:  No areas of easy bleeding  Exam BP 124/78   Pulse 73   Temp 98 F (36.7 C) (Oral)   Resp 16   Ht 5\' 4"  (1.626 m)   Wt 228 lb (103.4 kg)   SpO2 (!) 86%   BMI 39.14 kg/m  General:  well developed, well nourished, in no apparent distress Skin:  no significant moles, warts, or growths Head:  no masses, lesions, or tenderness Eyes:  pupils equal and round, sclera anicteric without injection Ears:  canals without lesions, TMs shiny without retraction, no obvious effusion, no erythema Nose:  nares patent, mucosa normal, and no drainage Throat/Pharynx:  lips and gingiva without lesion; tongue and uvula midline; non-inflamed pharynx; no exudates or postnasal drainage Neck: neck supple without adenopathy, thyromegaly, or masses Lungs:  clear to auscultation, breath sounds equal bilaterally, no respiratory distress Cardio:  regular rate and rhythm, no LE edema Abdomen:  abdomen soft, nontender; bowel sounds normal; no masses or organomegaly Genital: Defer to GYN Musculoskeletal:  symmetrical muscle groups noted without atrophy or deformity Extremities:  no clubbing, cyanosis, or edema, no deformities, no skin discoloration Neuro:  gait normal; deep tendon reflexes normal and symmetric Psych: well oriented with normal range of affect and appropriate judgment/insight  Assessment and Plan  Well adult exam - Plan: CBC, Comprehensive metabolic panel, Lipid panel   Well 42 y.o. female. Counseled on diet and exercise. Advanced directive form provided today.  Flu shot politely declined.  Other orders as above. Follow up in 6 mo. The patient voiced understanding and  agreement to the plan.  [name] [place], DO 01/19/23 1:18 PM

## 2023-01-25 ENCOUNTER — Encounter: Payer: Self-pay | Admitting: Family Medicine

## 2023-01-26 ENCOUNTER — Other Ambulatory Visit: Payer: Self-pay

## 2023-01-26 DIAGNOSIS — R0609 Other forms of dyspnea: Secondary | ICD-10-CM

## 2023-01-26 MED ORDER — ALBUTEROL SULFATE (2.5 MG/3ML) 0.083% IN NEBU: INHALATION_SOLUTION | RESPIRATORY_TRACT | 1 refills | Status: AC

## 2023-01-30 ENCOUNTER — Other Ambulatory Visit: Payer: Self-pay | Admitting: Family Medicine

## 2023-05-16 ENCOUNTER — Encounter: Payer: Self-pay | Admitting: Family Medicine

## 2023-05-16 NOTE — Telephone Encounter (Signed)
 [facility] at [phone]. Spoke w/ [place]. Emgality does not need PA. Copay: 30.00. Pt reports she cannot afford this.

## 2023-06-11 ENCOUNTER — Encounter: Payer: Self-pay | Admitting: Family Medicine

## 2023-06-11 MED ORDER — MONTELUKAST SODIUM 10 MG PO TABS: 10.0000 mg | ORAL_TABLET | Freq: Every day | ORAL | 1 refills | Status: DC

## 2023-07-20 ENCOUNTER — Ambulatory Visit (INDEPENDENT_AMBULATORY_CARE_PROVIDER_SITE_OTHER): Admitting: Family Medicine

## 2023-07-20 ENCOUNTER — Encounter: Payer: Self-pay | Admitting: Family Medicine

## 2023-07-20 ENCOUNTER — Telehealth: Payer: 59 | Admitting: Family Medicine

## 2023-07-20 VITALS — BP 118/76 | HR 95 | Temp 98.0°F | Resp 16 | Ht 64.0 in | Wt 224.0 lb

## 2023-07-20 DIAGNOSIS — J4540 Moderate persistent asthma, uncomplicated: Secondary | ICD-10-CM

## 2023-07-20 DIAGNOSIS — F411 Generalized anxiety disorder: Secondary | ICD-10-CM | POA: Insufficient documentation

## 2023-07-20 MED ORDER — BUPROPION HCL ER (XL) 300 MG PO TB24: 300.0000 mg | ORAL_TABLET | Freq: Every day | ORAL | 2 refills | Status: DC

## 2023-07-20 MED ORDER — PROPRANOLOL HCL 10 MG PO TABS: ORAL_TABLET | ORAL | 1 refills | Status: AC
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
## 2023-07-20 NOTE — Patient Instructions (Addendum)
 Send me a message in a month with how things are going.   Coping skills Choose 5 that work for you: Take a deep breath Count to 20 Read a book Do a puzzle Meditate Bake Sing Knit Garden Pray Go outside Call a friend Listen to music Take a walk Color Send a note Take a bath Watch a movie Be alone in a quiet place Pet an animal Visit a friend Journal Exercise Stretch   Let us  know if you need anything.

## 2023-08-29 ENCOUNTER — Encounter: Payer: Self-pay | Admitting: Family Medicine

## 2023-08-29 ENCOUNTER — Other Ambulatory Visit: Payer: Self-pay | Admitting: Family Medicine

## 2023-08-29 MED ORDER — PROMETHAZINE-DM 6.25-15 MG/5ML PO SYRP: 5.0000 mL | ORAL_SOLUTION | Freq: Four times a day (QID) | ORAL | 0 refills | Status: DC | PRN

## 2023-08-29 MED ORDER — BUPROPION HCL ER (XL) 150 MG PO TB24: 150.0000 mg | ORAL_TABLET | Freq: Every day | ORAL | 3 refills | Status: AC

## 2023-09-04 ENCOUNTER — Other Ambulatory Visit: Payer: Self-pay | Admitting: *Deleted

## 2023-09-04 MED ORDER — RIZATRIPTAN BENZOATE 10 MG PO TABS: 10.0000 mg | ORAL_TABLET | ORAL | 0 refills | Status: DC | PRN

## 2023-09-04 NOTE — Telephone Encounter (Signed)
 Last seen on 09/06/22 Follow up scheduled on 09/12/23

## 2023-09-12 ENCOUNTER — Other Ambulatory Visit: Payer: Self-pay | Admitting: Medical Genetics

## 2023-09-12 ENCOUNTER — Encounter: Payer: Self-pay | Admitting: Family Medicine

## 2023-09-12 ENCOUNTER — Ambulatory Visit: Payer: 59 | Admitting: Family Medicine

## 2023-09-12 VITALS — BP 105/72 | HR 93 | Ht 64.0 in | Wt 223.0 lb

## 2023-09-12 DIAGNOSIS — G43009 Migraine without aura, not intractable, without status migrainosus: Secondary | ICD-10-CM | POA: Diagnosis not present

## 2023-09-12 DIAGNOSIS — G4733 Obstructive sleep apnea (adult) (pediatric): Secondary | ICD-10-CM

## 2023-09-12 MED ORDER — EMGALITY 120 MG/ML ~~LOC~~ SOAJ: SUBCUTANEOUS | 3 refills | Status: AC

## 2023-09-12 MED ORDER — RIZATRIPTAN BENZOATE 10 MG PO TABS: 10.0000 mg | ORAL_TABLET | ORAL | 0 refills | Status: AC | PRN

## 2023-09-12 NOTE — Patient Instructions (Signed)
 Please continue using your CPAP regularly. While your insurance requires that you use CPAP at least 4 hours each night on 70% of the nights, I recommend, that you not skip any nights and use it throughout the night if you can. Getting used to CPAP and staying with the treatment long term does take time and patience and discipline. Untreated obstructive sleep apnea when it is moderate to severe can have an adverse impact on cardiovascular health and raise her risk for heart disease, arrhythmias, hypertension, congestive heart failure, stroke and diabetes. Untreated obstructive sleep apnea causes sleep disruption, nonrestorative sleep, and sleep deprivation. This can have an impact on your day to day functioning and cause daytime sleepiness and impairment of cognitive function, memory loss, mood disturbance, and problems focussing. Using CPAP regularly can improve these symptoms.  We will update supply orders, today. You are eligible for a new machine. I will order a repeat sleep study that you will do at home. Please listen out for a call from our sleep lab staff to schedule. Once you complete study, our sleep doctors with evaluate the data and we will use that to order a new machine as indicated. This process could take a few weeks. Once new machine is ordered, you will hear back from your DME company to schedule set up of your new machine.   We will need to see you back within 31-90 days following set up of your new CPAP to document compliance as required by your insurance company. Please call the office to schedule follow up when you receive your new machine. Please feel free to reach out with any questions or concerns.    Continue Emgality  and rizatriptan  for headache management.

## 2023-09-12 NOTE — Progress Notes (Signed)
 PATIENT: Marie Rios DOB: 1980/04/28  REASON FOR VISIT: follow up HISTORY FROM: patient  Chief Complaint  Patient presents with   RM1/CPAP/MIGRAINES    Pt is here with husband. Pt states she may get 1 migraine out of th months. Pt states that she would like to do the [place] and the dental guard.    Athar: sleep Ahern: migraines   HISTORY OF PRESENT ILLNESS:  09/12/2023 ALL: Marie Rios returns for follow up for migraines and OSA on CPAP. She reports migraines are well managed. She may have a couple a month. Emgality  works great. Rizatriptan  has been more effective than naratriptan . She may take 1 per month. She continues to fight with CPAP most nights. She does note improvement in sleep quality and headaches when using therapy but was hoping to be considered for Inspire. She denies specific concerns with machine or supplies.     09/06/22 ALL (Mychart): Marie Rios returns for follow up for migraines and OSA on CPAP. She continues Emgality  and rizatriptan . Migraines are well managed. She may have 1-2 migraines per month. Rizatriptan  usually works well. She rarely takes Amerge (maybe every other month or so) but feels like it works if rizatriptan  doesn't. Ondansetron  helps with nausea. She may take this once a month. Se continues CPAP therapy nightly for about 7 hours, on average. She does not love using therapy but does feel she is less sleepy during the day. More energized.     08/30/2021 ALL (Mychart):  Marie Rios is a 43 y.o. female here today for follow up for migraines and OSA on CPAP. She continues Emgality  and rizatriptan . She has been doing very well. Maybe 1-2 headache days a month. Rizatriptan  usually works well for abortive therapy. She reports having a intractable migraine lasting about a week. She took a medrol  dose pack with OTC analgesics. Pain is better. She continues to have a dull lingering headache. She reports having some allergy symptoms a couple weeks ago but  those have resolved. She also report spironolactone  dose was increased to 75mg . She started this dose last week. She is having more constipation. She was advised to increase Citrucel and fiber in diet. She is drinking 7-8 glasses of water daily.   She continues CPAP. No concerns with machine or supplies.      09/20/2020 ALL: Marie Rios returns for follow up for migraines (Ahern) and OSA on CPAP (Athar). She continues Emgality  and rizatrptan. She reports headaches are better on Emgality . She does continue to have about 15 headache days. About 7 migraine days. Migraines are usually easily aborted with rizatriptan . She may take 4-5 times a month. She reports weight gain over the past year. She is using intermittent fasting with diet changes but not able to lose weight. Review of chart shows that she lost 25 pounds after being on Emgality  for 1 year. She has gained about 45 pounds over the past year. She is now on estrogen replacement. She is hesitant to make a change as Emgality  has worked so well.     09/18/2019 ALL:  Marie Rios is a 42 y.o. female here today for follow up for migraines and OSA.  She reports that she is doing fairly well today.  She does continue to have regular headaches.  She feels that she has some sort of mild headache almost every day.  She feels that Emgality  has helped but is not working as well as it used to.  She has at least 4 migraines  every month, sometimes more.  Rizatriptan  does help to abort migraine.  She is using her CPAP every night.  She is doing very well with CPAP therapy and has been for the past year.  She was last seen by ophthalmology in December 2020.  Eye exam was normal.  She has a history of IIH.  She reports psychotic side effects with topiramate  and Zonegran .  She is very hesitant to consider Diamox.  She denies any visual changes.  No blurred vision or vision loss.  She does occasionally see spots in her vision with a headache.  She continues to have  concerns of excessive fatigue.  She admits that she does not drink enough water.  She does not exercise.  Her last child has just recently moved into college.  She is working full-time in the Physicist, medical.  Compliance report dated 08/18/2019 through 09/16/2019 reveals that she used CPAP 29 of the last 30 days for compliance of 97%.  She is CPAP greater than 4 hours 29 of the past 30 days for compliance of 97%.  Average usage on days used was 7 hours and 12 minutes.  Residual AHI was 1.3 on 5 to 11 cm of water and an EPR of 3.  There was no leak noted in her mask.  HISTORY: (copied from my note on 09/24/2018)  Marie Rios is a 43 y.o. female here today for follow up of migraines. She reports that headaches occur nearly daily. Migraines are improved but continue to occur regularly. She continues Emgality  monthly. She has also started CPAP therapy with excellent compliance. She has not noticed much improvement since starting CPAP. She did have MRI that showed concerns of mildly enlarged sella turnica that could be incidental or related to IIH. She has never been treated in the past for IIH. She has taken topiramate  and Zonegran  in the past with reports of psychotic events and worsening anger. She reports last eye exam was about a year ago and normal. She denies ringing in ears, vision changes, or loss of vision. Headaches are tension type headaches and occur often in the morning hours.    HISTORY: (copied from my note on 04/08/2018)   Marie Rios is a 43 y.o. female here today for follow up for migraines and dizziness (chronic). She was seen on 3/2 by PCP for worsening migraines and an episode of what she feels was syncope. She reports that she fell and hit her head after standing to go to the kitchen. Her husband came to her aid after hearing her fall. She was sluggish following but no worrisome findings. No seizure like activity, incontinence, or biting tongue. She was given migraine  cocktail of Torodol, Phenergan  and Benadryl  in office. On 3/4 she contacted PCP with reports of another syncopal episode (after standing up) and no improvement of headache. She was given steroid burst. She did not feel that this helped her headache but did improve dizziness. She reports getting dizzy with standing or turning her head too fast. She is not drinking water regularly. She is taking Emgality  monthly as prescribed. She uses Maxalt , Zofran  and tizanidine  as needed for abortive therapy. She reports that this combination usually helps but has not this past week. She admits that she does not drink much fluids throughout the day.    She is being evaluated by Dr Buck for possible sleep apnea and has upcoming sleep study pending insurance approval.    She has questionable seizure activity when  angry or stressed. She reports last event was 06/2017. She has not taken Depakote  in 2 years.    HISTORY: (copied from Dr Sharion note on 01/15/2018)   Interval update for migraines (seziures may be non-epileptic events). Started Topiramate  but could not tolerate. Also started Ajovy  (has latex allergy can't take aimovis) but insurance required us  to try Emgality  which was approved. Emgality  is helping with migraines. Sent in zofran  for acute management. She was on Depakote  in the past, seizure-like events happen when angry and stressed. MRI of the brain was ordered but not completed. CBC and CMP were normal.    Having nausea after dose of Emgality . Takes Zofran  prior to dose and this  helps. Has 5 headaches per month/ 3-4 migraines per month. This is a 50% improvement.   Couldn't tolerate Trokendi  nor Zonegran  due to anger and psychotic episodes.    No seizure like activity since starting Emgality . Last episode was 06/2017.    Still snoring but does not feel it is as bad. 1-2 mornings she wakes up with headaches. She wakes daily with dry mouth. Having daytime sleepiness. Was advised sleep study     Interval history 09/19/2017: Has not seen patient in approx 2 years. Migraines worsened last year in the setting of stress. She has not taken Depakote  in 4 months, she was tired of it. She had reported seizure in June of this year, discussed no driving restrictions. She stopped taking her Depakote . Over the last year she has 15 headache days a month and 8 are migrainous. Migraines are She has light sensitivity, sounds sensitivity, ears ringing, dizziness, vertigo, locations in the frontal area occ in the right occipital. Feels stabbing, throbbing. It can last up to 2 days. Unilateral migraines, pulsating and pounding and throbbing, She gets nausea no vomiting, She has light sensitivity, sounds sensitivity, ears ringing, dizziness, vertigo, locations in the frontal area occ in the right occipital. Feels stabbing, throbbing. It can last up to 2 days and be severe. She gets nausea no vomiting. She has untreated sleep apnea. She takes excedrin every day. She has positional headaches and also vision blurriness and vision changes. Untreated sleep apnea with severe sleep apnea.   - requested records from previous sleep doctor, pending   - reviewed sleep report and data, AHI in 2011 to a max of 121 in REM sleep, was titrated on 10cm in 2011   Interval history 12/01/2015:  Patient is doing well. One migraine a month. Tolerating Depakote  well. No more events of alteration of consciousness. The maxalt  works with the zofran , she has not tried the guam. It takes several hours for the maxalt  to work, she has to take it twice. Suggest taking the maxalt  with the zofran  and cambia. She had hysterectomy.    HPI:  Marie Rios is a 43 y.o. female here as a referral from Dr. Harvey for Migraines and seizures in the setting of stress and anxiety.migraines Started 10 years ago. She has the migraines twice a month. She has light sensitivity, sounds sensitivity, ears ringing, dizziness, vertigo, locations in the frontal area  occ in the right occipital. Feels stabbing, throbbing. It can last up to 2 days. She gets nausea no vomiting. She has diarrhea sometimes. She is on Depakote  but just recently restarted, she was successfully treated with Depakote  in the past but was feeling better and stopped it. She has had a hysterectomy. She has been on Depakote  off and on for 20 years. Also on Dilantin. She has  a history of seizures as well. The seizures always happen when she gets stressed or with anger or anxiety , she passes out and jerks and shakes and wakes up confused. Workup has been negative. EEGs have been normal. Last time she had a seizure was on June 6th. She was seeing a neurologist. She was on Topiramate  200mg  twice a day. She was seeing a doctor in [place] and the Topiramate  was too high. She was angry earlier this month, she was frstrated, she passed out. She says she got dizzy, vertigo, she came to with a cloth over her head and something cold over her neck. It lasted one minute. Husband says she will jerk than shake back and forth. Only when she stresses. This was the first one in a year. The seizures are always with stress or emotion that triggers it. She was on Depakote  twice a day for many years. No other focal neurologic deficits or complaints.   Reviewed notes, labs and imaging from outside physicians, which showed: Patient was seen at [facility] for evaluation of vertigo, symptoms started in July 2012 with elevated Dilantin levels in the emergency room. Stress exacerbate her symptoms. She has participated and vestibular rehabilitation. The rehabilitation and improved her dizziness been no change in nausea. She reported associated seizures and migraines and associated sensitivity to light. On examination tests of ocular motor function revealed normal saccades, pursuit and optokinetic eye movements. There was no spontaneous nystagmus or gaze evoked nystagmus, rotary chair testing revealed normal vestibular ocular reflex  gain, phase and symmetry at all frequencies testing indicating affective vestibular control. Dix-Hallpike's tests were negative for posterior semicircular canal BPPV, roll tests were negative for horizontal semicircular canal BPPV, caloric stimulation showed symmetric vestibular responses. Diagnosis was normal peripheral and central vestibular functions.   REVIEW OF SYSTEMS: Out of a complete 14 system review of symptoms, the patient complains only of the following symptoms, headaches, fatigue and all other reviewed systems are negative.  ALLERGIES: Allergies  Allergen Reactions   Gabapentin      Intolerant.    Keflex [Cephalexin]     Reflux   Latex Other (See Comments)    Rash   Ciprofloxacin Rash    HOME MEDICATIONS: Outpatient Medications Prior to Visit  Medication Sig Dispense Refill   Adalimumab  (HUMIRA ) 40 MG/0.8ML PSKT Inject 0.8 mLs (40 mg total) into the skin once a week. 2 each    albuterol  (PROVENTIL ) (2.5 MG/3ML) 0.083% nebulizer solution Use every 4 hours while awake for 5 days. Then use every 6 hours as needed. 150 mL 1   albuterol  (VENTOLIN  HFA) 108 (90 Base) MCG/ACT inhaler Inhale 2 puffs into the lungs every 6 (six) hours as needed for wheezing or shortness of breath. 8 g 2   buPROPion  (WELLBUTRIN  XL) 150 MG 24 hr tablet Take 1 tablet (150 mg total) by mouth daily. 90 tablet 3   cholestyramine  (QUESTRAN ) 4 g packet Take 1 packet (4 g total) by mouth 3 (three) times daily with meals. 60 each 12   dicyclomine  (BENTYL ) 10 MG capsule Take 1 tab every 6 hours as needed for abdominal cramping. 60 capsule 0   estrogens -methylTEST (ESTRATEST) 1.25-2.5 MG tablet Take 1 tablet by mouth daily. 90 tablet 4   fluticasone  (FLONASE ) 50 MCG/ACT nasal spray Place 2 sprays into both nostrils daily. 16 g 6   fluticasone -salmeterol (ADVAIR HFA) 230-21 MCG/ACT inhaler Inhale 2 puffs into the lungs 2 (two) times daily. 12 g 5   hydrocortisone  (ANUSOL -HC) 2.5 % rectal cream  Place 1 Application  rectally 2 (two) times daily as needed for hemorrhoids or anal itching. 30 g 1   ipratropium (ATROVENT) 0.03 % nasal spray Place 2 sprays into both nostrils daily.     montelukast  (SINGULAIR ) 10 MG tablet Take 1 tablet (10 mg total) by mouth at bedtime. 90 tablet 1   promethazine -dextromethorphan (PROMETHAZINE -DM) 6.25-15 MG/5ML syrup Take 5 mLs by mouth 4 (four) times daily as needed for cough. 118 mL 0   propranolol  (INDERAL ) 10 MG tablet Take 1 tab by mouth 3 times daily as needed for anxiety/panic attacks. 30 tablet 1   spironolactone  (ALDACTONE ) 25 MG tablet Take 3 tablets (75 mg total) by mouth daily. 270 tablet 3   Galcanezumab -gnlm (EMGALITY ) 120 MG/ML SOAJ INJECT 120 MG INTO THE SKIN EVERY 30 (THIRTY) DAYS. 3 mL 3   naratriptan  (AMERGE) 2.5 MG tablet Take 1 tablet (2.5 mg total) by mouth as needed for migraine. Take one (1) tablet at onset of headache; if returns or does not resolve, may repeat after 4 hours; do not exceed five (5) mg in 24 hours. 10 tablet 0   rizatriptan  (MAXALT ) 10 MG tablet Take 1 tablet (10 mg total) by mouth as needed for migraine. May repeat in 2 hours if needed. Max 2 doses in 24 hours. Appointment needed. 10 tablet 0   No facility-administered medications prior to visit.    PAST MEDICAL HISTORY: Past Medical History:  Diagnosis Date   Anxiety    Asthma    Breast cyst, left 2019   Depression    Fibromyalgia    History of stomach ulcers    Lumbar herniated disc    Migraine    Seizures (HCC)     PAST SURGICAL HISTORY: Past Surgical History:  Procedure Laterality Date   APPENDECTOMY  1996   CERVICAL SPINE SURGERY  2020   CHOLECYSTECTOMY  2003   KNEE ARTHROCENTESIS  01/2022   LAPAROSCOPIC OOPHERECTOMY Right 2006   LAPAROSCOPIC OOPHERECTOMY Left 2007   pevlic surgery  2006   Plevic mass   TUBAL LIGATION  2003   VAGINAL HYSTERECTOMY  2005    FAMILY HISTORY: Family History  Problem Relation Age of Onset   Heart attack Father        nov 2018    Diabetes Other    Cancer Other    Heart disease Other    Stroke Other    Anemia Other    Fibromyalgia Other    Stomach cancer Paternal Great-grandmother    Seizures Neg Hx    Migraines Neg Hx    Colon cancer Neg Hx    Rectal cancer Neg Hx    Esophageal cancer Neg Hx     SOCIAL HISTORY: Social History   Socioeconomic History   Marital status: Married    Spouse name: Redell   Number of children: 3   Years of education: 14   Highest education level: Associate degree: academic program  Occupational History   Occupation: [employer] & [employer]    Comment: left dec 2018   Occupation: Database administrator: [employer]    Comment: Adult nurse @ [employer]  Tobacco Use   Smoking status: Former    Current packs/day: 0.00    Types: Cigarettes    Quit date: 2018    Years since quitting: 7.6   Smokeless tobacco: Never   Tobacco comments:    black & mild  Vaping Use   Vaping status: Former  Substance and Sexual Activity  Alcohol use: Not Currently    Comment: was social, no longer drinking    Drug use: Not Currently    Comment: Marijuana- ocassionally (07/21/15)   Sexual activity: Not on file  Other Topics Concern   Not on file  Social History Narrative   Lives: with spouse and 2 of her children    Caffeine use: 1 cup of tea daily   Right handed   Social Drivers of Health   Financial Resource Strain: Low Risk  (07/16/2023)   Overall Financial Resource Strain (CARDIA)    Difficulty of Paying Living Expenses: Not very hard  Food Insecurity: No Food Insecurity (07/16/2023)   Hunger Vital Sign    Worried About Running Out of Food in the Last Year: Never true    Ran Out of Food in the Last Year: Never true  Transportation Needs: No Transportation Needs (07/16/2023)   PRAPARE - Administrator, Civil Service (Medical): No    Lack of Transportation (Non-Medical): No  Physical Activity: Insufficiently Active (07/16/2023)   Exercise Vital Sign    Days of Exercise per  Week: 3 days    Minutes of Exercise per Session: 30 min  Stress: Stress Concern Present (07/16/2023)   [employer] of Occupational Health - Occupational Stress Questionnaire    Feeling of Stress: To some extent  Social Connections: Moderately Integrated (07/16/2023)   Social Connection and Isolation Panel    Frequency of Communication with Friends and Family: More than three times a week    Frequency of Social Gatherings with Friends and Family: Once a week    Attends Religious Services: More than 4 times per year    Active Member of [employer] or Organizations: No    Attends Engineer, structural: Not on file    Marital Status: Married  Catering manager Violence: Not on file      PHYSICAL EXAM  Vitals:   09/12/23 1441  BP: 105/72  Pulse: 93  SpO2: 99%  Weight: 223 lb (101.2 kg)  Height: 5' 4 (1.626 m)    Body mass index is 38.28 kg/m.  Generalized: Well developed, in no acute distress  Cardiology: normal rate and rhythm, no murmur noted Respiratory: clear to auscultation bilaterally  Neurological examination  Mentation: Alert oriented to time, place, history taking. Follows all commands speech and language fluent Cranial nerve II-XII: Pupils were equal round reactive to light. Extraocular movements were full, visual field were full  Motor: The motor testing reveals 5 over 5 strength of all 4 extremities. Good symmetric motor tone is noted throughout.  Gait and station: Gait is normal.   DIAGNOSTIC DATA (LABS, IMAGING, TESTING) - I reviewed patient records, labs, notes, testing and imaging myself where available.      No data to display           Lab Results  Component Value Date   WBC 7.9 01/19/2023   HGB 12.8 01/19/2023   HCT 38.5 01/19/2023   MCV 91.0 01/19/2023   PLT 243.0 01/19/2023      Component Value Date/Time   NA 137 01/19/2023 1317   NA 143 09/19/2017 0943   K 4.1 01/19/2023 1317   CL 101 01/19/2023 1317   CO2 27 01/19/2023 1317    GLUCOSE 77 01/19/2023 1317   BUN 12 01/19/2023 1317   BUN 10 09/19/2017 0943   CREATININE 0.65 01/19/2023 1317   CREATININE 0.75 11/19/2020 1608   CALCIUM 8.9 01/19/2023 1317   PROT 7.6 01/19/2023 1317  PROT 7.2 09/19/2017 0943   ALBUMIN 4.3 01/19/2023 1317   ALBUMIN 4.3 09/19/2017 0943   AST 30 01/19/2023 1317   ALT 27 01/19/2023 1317   ALKPHOS 39 01/19/2023 1317   BILITOT 0.7 01/19/2023 1317   BILITOT 0.4 09/19/2017 0943   GFRNONAA >60 12/11/2019 0453   GFRAA 131 09/19/2017 0943   Lab Results  Component Value Date   CHOL 181 01/19/2023   HDL 52.10 01/19/2023   LDLCALC 118 (H) 01/19/2023   TRIG 52.0 01/19/2023   CHOLHDL 3 01/19/2023   No results found for: HGBA1C Lab Results  Component Value Date   VITAMINB12 761 09/18/2019   Lab Results  Component Value Date   TSH 2.05 11/19/2020       ASSESSMENT AND PLAN 43 y.o. year old female  has a past medical history of Anxiety, Asthma, Breast cyst, left (2019), Depression, Fibromyalgia, History of stomach ulcers, Lumbar herniated disc, Migraine, and Seizures (HCC). here with     ICD-10-CM   1. OSA on CPAP  G47.33 Home sleep test    2. Migraine without aura and without status migrainosus, not intractable  G43.009        Marie Rios feels that, overall, she is doing fairly well. She will continue Emgality for prevention rizatriptan for abortive therapy. May use ondansetron for intractable headaches. She will continue to use CPAP nightly and for greater than 4 hours each night. We will repeat HST. We have discussed pros and cons of Inspire versus PAP therapy. She wishes to be considered for Inspire if a candidate based on updated sleep study results. We have discussed weight management. Healthy lifestyle habits encouraged. She will follow up with me in 1 year, sooner if needed. She verbalizes understanding and agreement with this plan.   Orders Placed This Encounter  Procedures   Home sleep test    Standing Status:   Future     Expiration Date:   09/11/2024    Where should this test be performed::   [facility]      Meds ordered this encounter  Medications   rizatriptan (MAXALT) 10 MG tablet    Sig: Take 1 tablet (10 mg total) by mouth as needed for migraine. May repeat in 2 hours if needed. Max 2 doses in 24 hours. Appointment needed.    Dispense:  10 tablet    Refill:  0    Supervising Provider:   AHERN, ANTONIA B [[id number]]   Galcanezumab-gnlm (EMGALITY) 120 MG/ML SOAJ    Sig: INJECT 120 MG INTO THE SKIN EVERY 30 (THIRTY) DAYS.    Dispense:  3 mL    Refill:  3    Supervising Provider:   INES ONETHA NOVAK [id number]     I personally spent a total of 30 minutes in the care of the patient today including preparing to see the patient, getting/reviewing separately obtained history, performing a medically appropriate exam/evaluation, counseling and educating, placing orders, documenting clinical information in the EHR, independently interpreting results, and communicating results.  Greig Forbes, FNP-C 09/12/2023, 3:10 PM [facility] [address], [address] [place], [place] [address] [phone]

## 2023-09-21 ENCOUNTER — Other Ambulatory Visit (HOSPITAL_COMMUNITY)
Admission: RE | Admit: 2023-09-21 | Discharge: 2023-09-21 | Disposition: A | Discharge status: Home / Self Care | Payer: Self-pay | Source: Ambulatory Visit | Attending: Medical Genetics | Admitting: Medical Genetics

## 2023-09-21 ENCOUNTER — Ambulatory Visit: Admitting: Neurology

## 2023-09-21 DIAGNOSIS — G4733 Obstructive sleep apnea (adult) (pediatric): Secondary | ICD-10-CM | POA: Diagnosis not present

## 2023-09-21 DIAGNOSIS — G4734 Idiopathic sleep related nonobstructive alveolar hypoventilation: Secondary | ICD-10-CM

## 2023-09-30 LAB — GENECONNECT MOLECULAR SCREEN: Genetic Analysis Overall Interpretation: NEGATIVE

## 2023-10-30 NOTE — Progress Notes (Unsigned)
 Marie Rios

## 2023-10-31 NOTE — Procedures (Signed)
 [facility]  HOME SLEEP TEST (SANSA) REPORT (Mail-Out Device):   STUDY DATE: 10/16/23  DOB: 02-01-1980  MRN: [id number]  ORDERING CLINICIAN: True Mar, MD, PhD   REFERRING CLINICIAN: Greig Forbes, NP  CLINICAL INFORMATION/HISTORY (obtained from visit note dated 09/12/23): 43 year-old female with an underlying medical history of migraine headaches, history of stomach ulcer, fibromyalgia, depression, anxiety, asthma, and obesity, who presents for re-evaluation of her OSA. She has been on autoPAP of 5-11 cm with EPR of 3 with good apnea control.    BMI (at the time of sleep clinic visit and/or test date): 38.3 kg/m  FINDINGS:   Study Protocol:    The SANSA single-point-of-skin-contact chest-worn sensor - an FDA cleared and DOT approved type 4 home sleep test device - measures eight physiological channels,  including blood oxygen saturation (measured via PPG [photoplethysmography]), EKG-derived heart rate, respiratory effort, chest movement (measured via accelerometer), snoring, body position, and actigraphy. The device is designed to be worn for up to 10 hours per study.   Sleep Summary:   Total Recording Time (hours, min): 7 hours, 44 min  Total Effective Sleep Time (hours, min):  5 hours, 19 min  Sleep Efficiency (%):    69%   Respiratory Indices:   Calculated sAHI (per hour):  37/hour         Oxygen Saturation Statistics:    Oxygen Saturation (%) Mean: 89.9%   Minimum oxygen saturation (%):                 65.9%   O2 Saturation Range (%): 65.9 - 98.7%   Time below or at 88% saturation: 43 min   Pulse Rate Statistics:   Pulse Mean (bpm):    69/min    Pulse Range (61 - 111/min)   Snoring: mild to moderate   IMPRESSION/DIAGNOSES:   OSA (obstructive sleep apnea), severe  Nocturnal Hypoxemia  RECOMMENDATIONS:   This home sleep test demonstrates severe obstructive sleep apnea with a total AHI of 37/hour and O2 nadir of 65.9% with  significant time below or at 88% saturation of over 40 minutes for the study, indicating nocturnal hypoxemia. Snoring was detected, in the mild to moderate range.  Ongoing treatment with positive airway pressure is highly recommended. The patient will be advised to continue with autoPAP therapy at home. She may be eligible for a new machine. A laboratory attended titration study can be considered in the future for optimization of treatment settings and to improve tolerance and compliance, if needed, down the road. Alternative treatment options are limited secondary to the severity of the patient's sleep disordered breathing. I recommend the patient follow up with her PCP regarding her low average oxygen saturations of less than 90%. She may benefit from seeing a pulmonologist. The patient should be advised that weight loss may improve her OSA and surgical treatment with an implantable hypoglossal nerve stimulator (in carefully selected candidates, meeting criteria) can be considered if the patient has intolerance to PAP therapy and BMI is at or below 32.   Please note, that untreated obstructive sleep apnea may carry additional perioperative morbidity. Patients with significant obstructive sleep apnea should receive perioperative PAP therapy and the surgeons and particularly the anesthesiologist should be informed of the diagnosis and the severity of the sleep disordered breathing. The patient should be cautioned not to drive, work at heights, or operate dangerous or heavy equipment when tired or sleepy. Review and reiteration of good sleep hygiene measures should be pursued with any  patient. Other causes of the patient's symptoms, including circadian rhythm disturbances, an underlying mood disorder, medication effect and/or an underlying medical problem cannot be ruled out based on this test. Clinical correlation is recommended.  The patient and her referring provider will be notified of the test results. The  patient will be seen in follow up in sleep clinic at [facility].  I certify that I have reviewed the raw data recording prior to the issuance of this report in accordance with the standards of the American Academy of Sleep Medicine (AASM).    INTERPRETING PHYSICIAN:   True Mar, MD, PhD Medical Director, [facility]) Diplomat, ABPN (Neurology and Sleep)   [facility] [address] [place], [place] [address] [phone]

## 2023-11-01 ENCOUNTER — Ambulatory Visit: Payer: Self-pay | Admitting: Family Medicine

## 2023-11-01 DIAGNOSIS — G4733 Obstructive sleep apnea (adult) (pediatric): Secondary | ICD-10-CM

## 2023-11-01 NOTE — Telephone Encounter (Signed)
 Community message sent to [employer] for new cpap order.

## 2023-11-05 NOTE — Telephone Encounter (Signed)
 Marie Rios

## 2023-11-05 NOTE — Telephone Encounter (Signed)
 Sent community message to [employer] to inquire what options pt may have, waiting on response

## 2023-11-22 ENCOUNTER — Other Ambulatory Visit: Payer: Self-pay | Admitting: Family Medicine

## 2023-12-18 ENCOUNTER — Encounter: Payer: Self-pay | Admitting: Family Medicine

## 2023-12-31 NOTE — Progress Notes (Unsigned)
 SABRA

## 2023-12-31 NOTE — Progress Notes (Unsigned)
 PATIENT: Marie Rios DOB: 01-12-1981  REASON FOR VISIT: follow up HISTORY FROM: patient  Virtual Visit via MyChart video  I connected with Marie Rios on 01/01/24 at  8:15 AM EST via MyChart video and verified that I am speaking with the correct person using two identifiers.   I discussed the limitations, risks, security and privacy concerns of performing an evaluation and management service by Mychart video and the availability of in person appointments. I also discussed with the patient that there may be a patient responsible charge related to this service. The patient expressed understanding and agreed to proceed.   History of Present Illness:  01/01/24 ALL (MyChart): Marie Rios is a 43 y.o. female here today for initial APAP compliance visit. Repeat HST 08/2023 showed severe obstructive sleep apnea with a total AHI of 37/hour and O2 nadir of 65.9% with significant time below or at 88% saturation of over 40 minutes for the study, indicating nocturnal hypoxemia. She was not able to get a new machine d/t cost. She is currently unemployed. She denies concerns with current machine. She does not love using therapy but does note better sleep quality when using it.     09/12/2023 ALL: Marie Rios returns for follow up for migraines and OSA on CPAP. She reports migraines are well managed. She may have a couple a month. Emgality  works great. Rizatriptan  has been more effective than naratriptan . She may take 1 per month. She continues to fight with CPAP most nights. She does note improvement in sleep quality and headaches when using therapy but was hoping to be considered for Inspire. She denies specific concerns with machine or supplies.     09/06/22 ALL (Mychart): Marie Rios returns for follow up for migraines and OSA on CPAP. She continues Emgality  and rizatriptan . Migraines are well managed. She may have 1-2 migraines per month. Rizatriptan  usually works well. She rarely takes  Amerge (maybe every other month or so) but feels like it works if rizatriptan  doesn't. Ondansetron  helps with nausea. She may take this once a month. Se continues CPAP therapy nightly for about 7 hours, on average. She does not love using therapy but does feel she is less sleepy during the day. More energized.     08/30/2021 ALL (Mychart):  Marie Rios is a 43 y.o. female here today for follow up for migraines and OSA on CPAP. She continues Emgality  and rizatriptan . She has been doing very well. Maybe 1-2 headache days a month. Rizatriptan  usually works well for abortive therapy. She reports having a intractable migraine lasting about a week. She took a medrol  dose pack with OTC analgesics. Pain is better. She continues to have a dull lingering headache. She reports having some allergy symptoms a couple weeks ago but those have resolved. She also report spironolactone  dose was increased to 75mg . She started this dose last week. She is having more constipation. She was advised to increase Citrucel and fiber in diet. She is drinking 7-8 glasses of water daily.   She continues CPAP. No concerns with machine or supplies.      09/20/2020 ALL: Marie Rios returns for follow up for migraines (Ahern) and OSA on CPAP (Athar). She continues Emgality  and rizatrptan. She reports headaches are better on Emgality . She does continue to have about 15 headache days. About 7 migraine days. Migraines are usually easily aborted with rizatriptan . She may take 4-5 times a month. She reports weight gain over the past year. She is using intermittent fasting  with diet changes but not able to lose weight. Review of chart shows that she lost 25 pounds after being on Emgality  for 1 year. She has gained about 45 pounds over the past year. She is now on estrogen replacement. She is hesitant to make a change as Emgality  has worked so well.     09/18/2019 ALL:  Marie Rios is a 43 y.o. female here today for follow up for  migraines and OSA.  She reports that she is doing fairly well today.  She does continue to have regular headaches.  She feels that she has some sort of mild headache almost every day.  She feels that Emgality  has helped but is not working as well as it used to.  She has at least 4 migraines every month, sometimes more.  Rizatriptan  does help to abort migraine.  She is using her CPAP every night.  She is doing very well with CPAP therapy and has been for the past year.  She was last seen by ophthalmology in December 2020.  Eye exam was normal.  She has a history of IIH.  She reports psychotic side effects with topiramate  and Zonegran .  She is very hesitant to consider Diamox.  She denies any visual changes.  No blurred vision or vision loss.  She does occasionally see spots in her vision with a headache.  She continues to have concerns of excessive fatigue.  She admits that she does not drink enough water.  She does not exercise.  Her last child has just recently moved into college.  She is working full-time in the [employer].  Compliance report dated 08/18/2019 through 09/16/2019 reveals that she used CPAP 29 of the last 30 days for compliance of 97%.  She is CPAP greater than 4 hours 29 of the past 30 days for compliance of 97%.  Average usage on days used was 7 hours and 12 minutes.  Residual AHI was 1.3 on 5 to 11 cm of water and an EPR of 3.  There was no leak noted in her mask.   Observations/Objective:  Generalized: Well developed, in no acute distress  Mentation: Alert oriented to time, place, history taking. Follows all commands speech and language fluent   Assessment and Plan:  43 y.o. year old female  has a past medical history of Anxiety, Asthma, Breast cyst, left (2019), Depression, Fibromyalgia, History of stomach ulcers, Lumbar herniated disc, Migraine, and Seizures (HCC). here with    ICD-10-CM   1. OSA on CPAP  G47.33 For home use only DME continuous positive airway  pressure (CPAP)      Felicha is doing well on APAP therapy. Compliance visit shows excellent compliance. She was encouraged to continue using APAP nightly for at least 4 hours. Healthy lifestyle habits encouraged. She will follow up with me in 1 year, sooner if needed.   Orders Placed This Encounter  Procedures   For home use only DME continuous positive airway pressure (CPAP)    Heated Humidity with all supplies as needed    Length of Need:   Lifetime    Patient has OSA or probable OSA:   Yes    Is the patient currently using CPAP in the home:   Yes    Settings:   Other see comments    CPAP supplies needed:   Mask, headgear, cushions, filters, heated tubing and water chamber    No orders of the defined types were placed in this encounter.  Follow Up Instructions:  I discussed the assessment and treatment plan with the patient. The patient was provided an opportunity to ask questions and all were answered. The patient agreed with the plan and demonstrated an understanding of the instructions.   The patient was advised to call back or seek an in-person evaluation if the symptoms worsen or if the condition fails to improve as anticipated.  I provided 15 minutes of face-to-face and non face-to-face time during this MyChart video encounter. Patient located at their place of residence. Provider is in the office.    Simon Aaberg, NP

## 2023-12-31 NOTE — Patient Instructions (Signed)

## 2024-01-01 ENCOUNTER — Encounter: Payer: Self-pay | Admitting: Family Medicine

## 2024-01-01 ENCOUNTER — Telehealth: Admitting: Family Medicine

## 2024-01-01 DIAGNOSIS — G4733 Obstructive sleep apnea (adult) (pediatric): Secondary | ICD-10-CM

## 2024-01-22 ENCOUNTER — Encounter: Payer: 59 | Admitting: Family Medicine

## 2024-01-22 ENCOUNTER — Ambulatory Visit: Payer: 59 | Admitting: Family Medicine

## 2024-01-22 ENCOUNTER — Ambulatory Visit: Payer: Self-pay | Admitting: Family Medicine

## 2024-01-22 ENCOUNTER — Encounter: Payer: Self-pay | Admitting: Family Medicine

## 2024-01-22 VITALS — BP 128/82 | HR 82 | Temp 98.0°F | Resp 16 | Ht 64.0 in | Wt 234.0 lb

## 2024-01-22 DIAGNOSIS — K582 Mixed irritable bowel syndrome: Secondary | ICD-10-CM | POA: Diagnosis not present

## 2024-01-22 DIAGNOSIS — Z0000 Encounter for general adult medical examination without abnormal findings: Secondary | ICD-10-CM

## 2024-01-22 LAB — CBC
HCT: 39.4 % (ref 36.0–46.0)
Hemoglobin: 13.2 g/dL (ref 12.0–15.0)
MCHC: 33.5 g/dL (ref 30.0–36.0)
MCV: 88.7 fl (ref 78.0–100.0)
Platelets: 249 K/uL (ref 150.0–400.0)
RBC: 4.45 Mil/uL (ref 3.87–5.11)
RDW: 13.1 % (ref 11.5–15.5)
WBC: 8 K/uL (ref 4.0–10.5)

## 2024-01-22 LAB — COMPREHENSIVE METABOLIC PANEL WITH GFR
ALT: 29 U/L (ref 3–35)
AST: 38 U/L — ABNORMAL HIGH (ref 5–37)
Albumin: 4.3 g/dL (ref 3.5–5.2)
Alkaline Phosphatase: 42 U/L (ref 39–117)
BUN: 10 mg/dL (ref 6–23)
CO2: 24 meq/L (ref 19–32)
Calcium: 9 mg/dL (ref 8.4–10.5)
Chloride: 100 meq/L (ref 96–112)
Creatinine, Ser: 0.76 mg/dL (ref 0.40–1.20)
GFR: 95.58 mL/min
Glucose, Bld: 91 mg/dL (ref 70–99)
Potassium: 4.3 meq/L (ref 3.5–5.1)
Sodium: 133 meq/L — ABNORMAL LOW (ref 135–145)
Total Bilirubin: 0.8 mg/dL (ref 0.2–1.2)
Total Protein: 7.5 g/dL (ref 6.0–8.3)

## 2024-01-22 LAB — LIPID PANEL
Cholesterol: 175 mg/dL (ref 28–200)
HDL: 49.1 mg/dL
LDL Cholesterol: 104 mg/dL — ABNORMAL HIGH (ref 10–99)
NonHDL: 126.07
Total CHOL/HDL Ratio: 4
Triglycerides: 111 mg/dL (ref 10.0–149.0)
VLDL: 22.2 mg/dL (ref 0.0–40.0)

## 2024-01-22 MED ORDER — AMITRIPTYLINE HCL 10 MG PO TABS: 10.0000 mg | ORAL_TABLET | Freq: Every day | ORAL | 1 refills | Status: AC

## 2024-01-22 NOTE — Patient Instructions (Addendum)
Keep the diet clean and stay active.  Give [place] 2-3 business days to get the results of your labs back.   Please get me a copy of your advanced directive form at your convenience.   Let us know if you need anything.

## 2024-01-22 NOTE — Progress Notes (Signed)
 Chief Complaint  Patient presents with   Annual Exam    CPE     Well Woman Marie Rios is here for a complete physical.   Her last physical was >1 year ago.  Current diet: in general, diet could be better. Current exercise: none. Weight is increased and she denies fatigue out of ordinary. No LMP recorded. Patient has had a hysterectomy. Seatbelt? Yes Advanced directive? No  Health Maintenance Pap/HPV- N/A Mammogram- Yes Tetanus- Yes Hep C screening- Yes HIV screening- Yes  IBS History of combined IBS.  She is taking Bentyl  as needed.  It helps a bit with cramping.  She has upper and lower abdominal pain along with alternating constipation and diarrhea.  She has never been on a daily treatment for this.  Colonoscopy was unremarkable.  No unintentional weight loss, bleeding, or nausea/vomiting.   Past Medical History:  Diagnosis Date   Anxiety    Asthma    Breast cyst, left 2019   Depression    Fibromyalgia    History of stomach ulcers    Lumbar herniated disc    Migraine    Seizures (HCC)      Past Surgical History:  Procedure Laterality Date   APPENDECTOMY  1996   CERVICAL SPINE SURGERY  2020   CHOLECYSTECTOMY  2003   KNEE ARTHROCENTESIS  01/2022   LAPAROSCOPIC OOPHERECTOMY Right 2006   LAPAROSCOPIC OOPHERECTOMY Left 2007   pevlic surgery  2006   Plevic mass   TUBAL LIGATION  2003   VAGINAL HYSTERECTOMY  2005    Medications  Medications Ordered Prior to Encounter[1]   Allergies Allergies[2]  Review of Systems: Constitutional:  no unexpected weight changes Eye:  no recent significant change in vision Ear/Nose/Mouth/Throat:  Ears:  no recent change in hearing Nose/Mouth/Throat:  no complaints of nasal congestion, no sore throat Cardiovascular: no chest pain Respiratory:  no shortness of breath Gastrointestinal:  + As noted in HPI GU:  Female: negative for dysuria or pelvic pain Musculoskeletal/Extremities:  no pain of the joints Integumentary  (Skin/Breast):  no abnormal skin lesions reported Neurologic:  no headaches Endocrine:  +fatigue Hematologic/Lymphatic:  No areas of easy bleeding  Exam BP 128/82 (BP Location: Left Arm, Patient Position: Sitting)   Pulse 82   Temp 98 F (36.7 C) (Oral)   Resp 16   Ht 5' 4 (1.626 m)   Wt 234 lb (106.1 kg)   SpO2 98%   BMI 40.17 kg/m  General:  well developed, well nourished, in no apparent distress Skin:  no significant moles, warts, or growths Head:  no masses, lesions, or tenderness Eyes:  pupils equal and round, sclera anicteric without injection Ears:  canals without lesions, TMs shiny without retraction, no obvious effusion, no erythema Nose:  nares patent, mucosa normal, and no drainage Throat/Pharynx:  lips and gingiva without lesion; tongue and uvula midline; non-inflamed pharynx; no exudates or postnasal drainage Neck: neck supple without adenopathy, thyromegaly, or masses Lungs:  clear to auscultation, breath sounds equal bilaterally, no respiratory distress Cardio:  regular rate and rhythm, no LE edema Abdomen:  abdomen soft, TTP in the left lower quadrant region and upper quadrants; bowel sounds normal; no masses or organomegaly Genital: Defer to GYN Musculoskeletal:  symmetrical muscle groups noted without atrophy or deformity Extremities:  no clubbing, cyanosis, or edema, no deformities, no skin discoloration Neuro:  gait normal; deep tendon reflexes normal and symmetric Psych: well oriented with normal range of affect and appropriate judgment/insight  Assessment and  Plan  Well adult exam - Plan: CBC, Comprehensive metabolic panel with GFR, Lipid panel, Hepatitis B surface antibody,quantitative  Irritable bowel syndrome with both constipation and diarrhea - Plan: amitriptyline  (ELAVIL ) 10 MG tablet   Well 43 y.o. female. Counseled on diet and exercise. Other orders as above. Advanced directive form provided today.  Flu shot politely declined. Screen hep  B. IBS combined: Chronic, not controlled.  Continue Bentyl  10 mg 4 times daily as needed.  Add amitriptyline  10 mg nightly.  Follow-up in 1 month to recheck this. The patient voiced understanding and agreement to the plan.  Marie [place], DO 01/22/2024 8:09 AM     [1]  Current Outpatient Medications on File Prior to Visit  Medication Sig Dispense Refill   Adalimumab  (HUMIRA ) 40 MG/0.8ML PSKT Inject 0.8 mLs (40 mg total) into the skin once a week. 2 each    albuterol  (PROVENTIL ) (2.5 MG/3ML) 0.083% nebulizer solution Use every 4 hours while awake for 5 days. Then use every 6 hours as needed. 150 mL 1   buPROPion  (WELLBUTRIN  XL) 150 MG 24 hr tablet Take 1 tablet (150 mg total) by mouth daily. 90 tablet 3   cholestyramine  (QUESTRAN ) 4 g packet Take 1 packet (4 g total) by mouth 3 (three) times daily with meals. 60 each 12   estrogens -methylTEST (ESTRATEST) 1.25-2.5 MG tablet Take 1 tablet by mouth daily. 90 tablet 4   fluticasone  (FLONASE ) 50 MCG/ACT nasal spray Place 2 sprays into both nostrils daily. 16 g 6   fluticasone -salmeterol (ADVAIR HFA) 230-21 MCG/ACT inhaler Inhale 2 puffs into the lungs 2 (two) times daily. 12 g 5   Galcanezumab -gnlm (EMGALITY ) 120 MG/ML SOAJ INJECT 120 MG INTO THE SKIN EVERY 30 (THIRTY) DAYS. 3 mL 3   hydrocortisone  (ANUSOL -HC) 2.5 % rectal cream Place 1 Application rectally 2 (two) times daily as needed for hemorrhoids or anal itching. 30 g 1   ipratropium (ATROVENT) 0.03 % nasal spray Place 2 sprays into both nostrils daily.     montelukast  (SINGULAIR ) 10 MG tablet Take 1 tablet (10 mg total) by mouth at bedtime. 90 tablet 1   propranolol  (INDERAL ) 10 MG tablet Take 1 tab by mouth 3 times daily as needed for anxiety/panic attacks. 30 tablet 1   rizatriptan  (MAXALT ) 10 MG tablet Take 1 tablet (10 mg total) by mouth as needed for migraine. May repeat in 2 hours if needed. Max 2 doses in 24 hours. Appointment needed. 10 tablet 0   spironolactone   (ALDACTONE ) 25 MG tablet Take 3 tablets (75 mg total) by mouth daily. 270 tablet 3   No current facility-administered medications on file prior to visit.  [2]  Allergies Allergen Reactions   Gabapentin      Intolerant.    Keflex [Cephalexin]     Reflux   Latex Other (See Comments)    Rash   Ciprofloxacin Rash

## 2024-01-23 LAB — HEPATITIS B SURFACE ANTIBODY, QUANTITATIVE: Hep B S AB Quant (Post): <5 m[IU]/mL — ABNORMAL LOW

## 2024-02-26 ENCOUNTER — Ambulatory Visit: Admitting: Family Medicine

## 2024-12-15 ENCOUNTER — Ambulatory Visit: Admitting: Family Medicine

## 2025-01-26 ENCOUNTER — Encounter: Admitting: Family Medicine
